# Patient Record
Sex: Female | Born: 1935 | Race: White | Hispanic: No | Smoking: Current every day smoker
Health system: Southern US, Community
[De-identification: ages and names within clinical notes are randomized; demographics above are authoritative.]

## PROBLEM LIST (undated history)

## (undated) DIAGNOSIS — E039 Hypothyroidism, unspecified: Secondary | ICD-10-CM

## (undated) DIAGNOSIS — N189 Chronic kidney disease, unspecified: Secondary | ICD-10-CM

## (undated) DIAGNOSIS — I255 Ischemic cardiomyopathy: Secondary | ICD-10-CM

## (undated) DIAGNOSIS — E440 Moderate protein-calorie malnutrition: Secondary | ICD-10-CM

## (undated) DIAGNOSIS — I499 Cardiac arrhythmia, unspecified: Secondary | ICD-10-CM

## (undated) DIAGNOSIS — R17 Unspecified jaundice: Secondary | ICD-10-CM

## (undated) DIAGNOSIS — I2510 Atherosclerotic heart disease of native coronary artery without angina pectoris: Secondary | ICD-10-CM

## (undated) DIAGNOSIS — K219 Gastro-esophageal reflux disease without esophagitis: Secondary | ICD-10-CM

## (undated) DIAGNOSIS — Z950 Presence of cardiac pacemaker: Secondary | ICD-10-CM

## (undated) DIAGNOSIS — I4821 Permanent atrial fibrillation: Secondary | ICD-10-CM

## (undated) DIAGNOSIS — I77810 Thoracic aortic ectasia: Secondary | ICD-10-CM

## (undated) DIAGNOSIS — F332 Major depressive disorder, recurrent severe without psychotic features: Secondary | ICD-10-CM

## (undated) DIAGNOSIS — R001 Bradycardia, unspecified: Secondary | ICD-10-CM

## (undated) DIAGNOSIS — F419 Anxiety disorder, unspecified: Secondary | ICD-10-CM

## (undated) HISTORY — PX: CORONARY ARTERY BYPASS GRAFT: SHX141

## (undated) HISTORY — DX: Bradycardia, unspecified: R00.1

## (undated) HISTORY — DX: Ischemic cardiomyopathy: I25.5

## (undated) HISTORY — DX: Atherosclerotic heart disease of native coronary artery without angina pectoris: I25.10

## (undated) HISTORY — DX: Chronic kidney disease, unspecified: N18.9

## (undated) HISTORY — DX: Major depressive disorder, recurrent severe without psychotic features: F33.2

## (undated) HISTORY — DX: Hypothyroidism, unspecified: E03.9

## (undated) HISTORY — DX: Gastro-esophageal reflux disease without esophagitis: K21.9

## (undated) HISTORY — DX: Anxiety disorder, unspecified: F41.9

## (undated) HISTORY — PX: OTHER SURGICAL HISTORY: SHX169

## (undated) HISTORY — DX: Thoracic aortic ectasia: I77.810

## (undated) HISTORY — DX: Permanent atrial fibrillation: I48.21

## (undated) HISTORY — DX: Moderate protein-calorie malnutrition: E44.0

---
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
## 1968-06-14 HISTORY — PX: CORONARY ARTERY BYPASS GRAFT: SHX141

## 2006-11-16 HISTORY — PX: PACEMAKER IMPLANT: EP1218

## 2017-06-15 HISTORY — PX: PACEMAKER GENERATOR CHANGE: SHX5998

## 2017-12-20 DIAGNOSIS — D509 Iron deficiency anemia, unspecified: Secondary | ICD-10-CM | POA: Diagnosis not present

## 2017-12-20 DIAGNOSIS — E039 Hypothyroidism, unspecified: Secondary | ICD-10-CM | POA: Diagnosis not present

## 2017-12-20 DIAGNOSIS — D529 Folate deficiency anemia, unspecified: Secondary | ICD-10-CM | POA: Diagnosis not present

## 2017-12-20 DIAGNOSIS — R739 Hyperglycemia, unspecified: Secondary | ICD-10-CM | POA: Diagnosis not present

## 2017-12-20 DIAGNOSIS — E782 Mixed hyperlipidemia: Secondary | ICD-10-CM | POA: Diagnosis not present

## 2017-12-20 DIAGNOSIS — D519 Vitamin B12 deficiency anemia, unspecified: Secondary | ICD-10-CM | POA: Diagnosis not present

## 2017-12-20 DIAGNOSIS — I2510 Atherosclerotic heart disease of native coronary artery without angina pectoris: Secondary | ICD-10-CM | POA: Diagnosis not present

## 2017-12-20 DIAGNOSIS — I482 Chronic atrial fibrillation: Secondary | ICD-10-CM | POA: Diagnosis not present

## 2017-12-20 DIAGNOSIS — I10 Essential (primary) hypertension: Secondary | ICD-10-CM | POA: Diagnosis not present

## 2017-12-20 DIAGNOSIS — D649 Anemia, unspecified: Secondary | ICD-10-CM | POA: Diagnosis not present

## 2018-01-16 ENCOUNTER — Encounter: Payer: Self-pay | Admitting: *Deleted

## 2018-01-17 ENCOUNTER — Telehealth: Payer: Self-pay | Admitting: Cardiovascular Disease

## 2018-01-17 ENCOUNTER — Encounter: Payer: Self-pay | Admitting: Cardiovascular Disease

## 2018-01-17 ENCOUNTER — Other Ambulatory Visit: Payer: Self-pay

## 2018-01-17 ENCOUNTER — Ambulatory Visit (INDEPENDENT_AMBULATORY_CARE_PROVIDER_SITE_OTHER): Payer: Medicare Other | Admitting: Cardiovascular Disease

## 2018-01-17 VITALS — BP 103/63 | HR 78 | Ht 66.0 in | Wt 124.0 lb

## 2018-01-17 DIAGNOSIS — I25708 Atherosclerosis of coronary artery bypass graft(s), unspecified, with other forms of angina pectoris: Secondary | ICD-10-CM | POA: Diagnosis not present

## 2018-01-17 DIAGNOSIS — I5022 Chronic systolic (congestive) heart failure: Secondary | ICD-10-CM

## 2018-01-17 DIAGNOSIS — I77810 Thoracic aortic ectasia: Secondary | ICD-10-CM | POA: Diagnosis not present

## 2018-01-17 DIAGNOSIS — I429 Cardiomyopathy, unspecified: Secondary | ICD-10-CM

## 2018-01-17 DIAGNOSIS — I482 Chronic atrial fibrillation: Secondary | ICD-10-CM

## 2018-01-17 DIAGNOSIS — Z950 Presence of cardiac pacemaker: Secondary | ICD-10-CM

## 2018-01-17 DIAGNOSIS — I4821 Permanent atrial fibrillation: Secondary | ICD-10-CM

## 2018-01-17 NOTE — Addendum Note (Signed)
Addended by: Julian Hy T on: 01/17/2018 02:22 PM   Modules accepted: Orders

## 2018-01-17 NOTE — Patient Instructions (Addendum)
Medication Instructions:  Continue all current medications.  Labwork: none  Testing/Procedures:  Your physician has requested that you have an echocardiogram. Echocardiography is a painless test that uses sound waves to create images of your heart. It provides your doctor with information about the size and shape of your heart and how well your heart's chambers and valves are working. This procedure takes approximately one hour. There are no restrictions for this procedure.  Office will contact with results via phone or letter.    Follow-Up: Your physician wants you to follow up in: 6 months.  You will receive a reminder letter in the mail one-two months in advance.  If you don't receive a letter, please call our office to schedule the follow up appointment   Any Other Special Instructions Will Be Listed Below (If Applicable).  Enroll in our anticoagulation clinic Edrick Oh, RN) for Warfarin management.  Enroll in our device clinic.  If you need a refill on your cardiac medications before your next appointment, please call your pharmacy.

## 2018-01-17 NOTE — Addendum Note (Signed)
Addended by: Laurine Blazer on: 01/17/2018 02:16 PM   Modules accepted: Orders

## 2018-01-17 NOTE — Progress Notes (Addendum)
CARDIOLOGY CONSULT NOTE  Patient ID: Angelica Hernandez MRN: [id number] DOB/AGE: Dec 09, 1933 84 y.o.  Admit date: (Not on file) Primary Physician: Caryl Bis, MD Referring Physician: Caryl Bis, MD  Reason for Consultation: Coronary artery disease and atrial fibrillation  HPI: Angelica Hernandez is a 84 y.o. female who is being seen today for the evaluation of coronary artery disease and atrial fibrillation at the request of Caryl Bis, MD.   I reviewed extensive documentation provided by the patient's PCP.  She used to live in [place] and received her cardiac care in [place], [place].  I reviewed a progress note from 11/30/2016 by Dr. Luevenia Maxin, and electrophysiologist.  She reportedly has a history of coronary artery bypass graft surgery, sick sinus syndrome, permanent atrial fibrillation, mild dilation of ascending aorta, and pacemaker.  Three-vessel CABG was performed in the 1970s.  She had implantation of a Engineer, production pacemaker in 2008.  She underwent Holter monitoring in April 2018 which showed "atrial fibrillation with atrial pacing with a minimum heart rate of 66 bpm and a maximum heart rate of 179 bpm and an average heart rate of 94 bpm.  There were rare PVCs (total 14 beats) and one run of ventricular tachycardia.  The longest run was 5 beats at a rate of 148 bpm."  Echocardiogram on 09/23/2016 showed moderately reduced left ventricular systolic function, LVEF 17%, mild concentric LVH, mild dilation of ascending aorta, and mild mitral and tricuspid regurgitation.  Device interrogation in June 2018 demonstrated normal device function with 53% ventricular pacing and SSIR mode.  It appears she underwent generator replacement on 06/15/2017.  She walks her puppy every morning and denies exertional chest pain and shortness of breath.  She has seldom has chest pains and takes nitroglycerin when she does with immediate relief.  She denies  orthopnea and paroxysmal nocturnal dyspnea.  She has occasional palpitations.  She seldom has bilateral ankle swelling.  ECG performed in the office today which I ordered and personally interpreted demonstrates rate controlled atrial fibrillation.  Social history: She was born in [place].  Her father used to work in the [employer].  She moved to [place] when her daughter was 11 years old and she is now over 12 years old.  She moved to [place] 4 months ago as her daughter recently bought a house here.  She has 2 children.  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  Family history: 5 brothers died of myocardial infarction last year.  Her sister died from a fall.  Both her mother and father had MIs.     No Known Allergies  Current Outpatient Medications  Medication Sig Dispense Refill  . aspirin 81 MG chewable tablet Chew by mouth daily.    Marland Kitchen atorvastatin (LIPITOR) 10 MG tablet Take 10 mg by mouth daily.    . benazepril (LOTENSIN) 5 MG tablet Take 5 mg by mouth daily.    Marland Kitchen levothyroxine (SYNTHROID, LEVOTHROID) 50 MCG tablet Take 50 mcg by mouth daily before breakfast.    . metoprolol succinate (TOPROL-XL) 50 MG 24 hr tablet Take 50 mg by mouth daily. Take with or immediately following a meal.    . nitroGLYCERIN (NITROSTAT) 0.4 MG SL tablet Place 0.4 mg under the tongue every 5 (five) minutes as needed for chest pain.    Marland Kitchen warfarin (COUMADIN) 5 MG tablet Take 5 mg by mouth daily.  No current facility-administered medications for this visit.     Past Medical History:  Diagnosis Date  . Anxiety   . Arrhythmia   . Atrial fibrillation ([place])   . CAD (coronary artery disease)   . Chest pain   . CKD (chronic kidney disease)   . Gastro-esophageal reflux disease without esophagitis   . Hypothyroidism   . Mild dilation of ascending aorta (HCC)   . Moderate protein-calorie malnutrition ([place])   . Pacemaker   . Severe episode of recurrent major depressive  disorder, without psychotic features ([place])   . Sick sinus syndrome [facility])     Past Surgical History:  Procedure Laterality Date  . CESAREAN SECTION    . CORONARY ARTERY BYPASS GRAFT    . OTHER SURGICAL HISTORY     cesearan section   . pacemaker epicardial      Social History   Socioeconomic History  . Marital status: Widowed    Spouse name: Not on file  . Number of children: Not on file  . Years of education: Not on file  . Highest education level: Not on file  Occupational History  . Not on file  Social Needs  . Financial resource strain: Not on file  . Food insecurity:    Worry: Not on file    Inability: Not on file  . Transportation needs:    Medical: Not on file    Non-medical: Not on file  Tobacco Use  . Smoking status: Current Every Day Smoker    Packs/day: 0.50    Types: Cigarettes  . Smokeless tobacco: Never Used  Substance and Sexual Activity  . Alcohol use: Not on file  . Drug use: Not on file  . Sexual activity: Not on file  Lifestyle  . Physical activity:    Days per week: Not on file    Minutes per session: Not on file  . Stress: Not on file  Relationships  . Social connections:    Talks on phone: Not on file    Gets together: Not on file    Attends religious service: Not on file    Active member of club or organization: Not on file    Attends meetings of clubs or organizations: Not on file    Relationship status: Not on file  . Intimate partner violence:    Fear of current or ex partner: Not on file    Emotionally abused: Not on file    Physically abused: Not on file    Forced sexual activity: Not on file  Other Topics Concern  . Not on file  Social History Narrative  . Not on file      Current Meds  Medication Sig  . aspirin 81 MG chewable tablet Chew by mouth daily.  Marland Kitchen atorvastatin (LIPITOR) 10 MG tablet Take 10 mg by mouth daily.  . benazepril (LOTENSIN) 5 MG tablet Take 5 mg by mouth daily.  Marland Kitchen levothyroxine (SYNTHROID,  LEVOTHROID) 50 MCG tablet Take 50 mcg by mouth daily before breakfast.  . metoprolol succinate (TOPROL-XL) 50 MG 24 hr tablet Take 50 mg by mouth daily. Take with or immediately following a meal.  . nitroGLYCERIN (NITROSTAT) 0.4 MG SL tablet Place 0.4 mg under the tongue every 5 (five) minutes as needed for chest pain.  Marland Kitchen warfarin (COUMADIN) 5 MG tablet Take 5 mg by mouth daily.      Review of systems complete and found to be negative unless listed above in HPI    Physical  exam Blood pressure 103/63, pulse 78, height 5\' 6"  (1.676 m), weight 124 lb (56.2 kg), SpO2 99 %. General: NAD Neck: No JVD, no thyromegaly or thyroid nodule.  Lungs: Clear to auscultation bilaterally with normal respiratory effort. CV: Nondisplaced PMI. Regular rate and irregular rhythm, normal S1/S2, no S3, no murmur.  No peripheral edema.  No carotid bruit.   Abdomen: Soft, nontender, no distention.  Skin: Intact without lesions or rashes.  Neurologic: Alert and oriented x 3.  Psych: Normal affect. Extremities: No clubbing or cyanosis.  HEENT: Normal.   ECG: Most recent ECG reviewed.   Labs: No results found for: K, BUN, CREATININE, ALT, TSH, HGB   Lipids: No results found for: LDLCALC, LDLDIRECT, CHOL, TRIG, HDL      ASSESSMENT AND PLAN:   1.  Coronary artery disease with three-vessel CABG: Symptomatically stable.  Currently on aspirin, metoprolol succinate, benazepril, and Lipitor. I will order a 2-D echocardiogram with Doppler to evaluate cardiac structure, function, and regional wall motion.  I will try to obtain a copy of her most recent stress test from [place].  2.  Permanent atrial fibrillation:  Symptomatically stable on Toprol-XL.  Anticoagulated with warfarin.  I will enroll in our anticoagulation clinic for INR monitoring.  Holter monitor reviewed above.  3.  Mild aortic dilatation: I will order a 2-D echocardiogram with Doppler to evaluate aortic root size.  4.  Pacemaker: I will enroll  in device clinic for routine surveillance monitoring.  She underwent generator change in January 2019.  She has a Chiropractor initially implanted in 2008.  5.  Cardiomyopathy/chronic systolic heart failure: She appears euvolemic and not in need of diuretics.  She is on metoprolol succinate 50 mg daily and benazepril 5 mg daily.  No changes to therapy.  I will obtain an echocardiogram to assess cardiac structure and function.  Disposition: Follow up in 6 months  Signed: Kate Sable, M.D., F.A.C.C.  01/17/2018, 1:41 PM

## 2018-01-17 NOTE — Telephone Encounter (Signed)
Pre-cert Verification for the following procedure   Echo scheduled for 02-22-2018

## 2018-01-19 DIAGNOSIS — Z950 Presence of cardiac pacemaker: Secondary | ICD-10-CM | POA: Insufficient documentation

## 2018-01-19 NOTE — Addendum Note (Signed)
Addended by: Laurine Blazer on: 01/19/2018 05:51 PM   Modules accepted: Orders

## 2018-02-02 DIAGNOSIS — D519 Vitamin B12 deficiency anemia, unspecified: Secondary | ICD-10-CM | POA: Diagnosis not present

## 2018-02-02 DIAGNOSIS — D649 Anemia, unspecified: Secondary | ICD-10-CM | POA: Diagnosis not present

## 2018-02-02 DIAGNOSIS — D529 Folate deficiency anemia, unspecified: Secondary | ICD-10-CM | POA: Diagnosis not present

## 2018-02-14 ENCOUNTER — Ambulatory Visit (INDEPENDENT_AMBULATORY_CARE_PROVIDER_SITE_OTHER): Payer: Medicare Other | Admitting: *Deleted

## 2018-02-14 DIAGNOSIS — I4891 Unspecified atrial fibrillation: Secondary | ICD-10-CM | POA: Diagnosis not present

## 2018-02-14 DIAGNOSIS — Z5181 Encounter for therapeutic drug level monitoring: Secondary | ICD-10-CM

## 2018-02-14 LAB — POCT INR: INR: 4 — AB (ref 2.0–3.0)

## 2018-02-14 NOTE — Patient Instructions (Signed)
Takes coumadin in the morning.  Took it today. Hold coumadin tomorrow then resume 5mg  (1 tablet daily) Increase intake of Vit K food Recheck in 2 weeks

## 2018-02-20 ENCOUNTER — Encounter: Payer: Medicare Other | Admitting: Internal Medicine

## 2018-02-21 ENCOUNTER — Encounter: Payer: Self-pay | Admitting: *Deleted

## 2018-02-22 ENCOUNTER — Ambulatory Visit (INDEPENDENT_AMBULATORY_CARE_PROVIDER_SITE_OTHER): Payer: Medicare Other

## 2018-02-22 ENCOUNTER — Other Ambulatory Visit: Payer: Self-pay

## 2018-02-22 DIAGNOSIS — I429 Cardiomyopathy, unspecified: Secondary | ICD-10-CM

## 2018-02-22 DIAGNOSIS — I361 Nonrheumatic tricuspid (valve) insufficiency: Secondary | ICD-10-CM

## 2018-02-24 ENCOUNTER — Telehealth: Payer: Self-pay | Admitting: *Deleted

## 2018-02-24 NOTE — Telephone Encounter (Signed)
Notes recorded by Laurine Blazer, LPN on 6/38/4536 at 4:68 AM EDT Daughter Jamey Ripa) notified. Copy to pmd. ------  Notes recorded by Herminio Commons, MD on 02/23/2018 at 8:51 AM EDT Pumping function was mildly reduced but has improved since prior echocardiogram in 2018. There was some mild mitral and moderate tricuspid valve leakage. No changes to medical management.

## 2018-02-28 ENCOUNTER — Ambulatory Visit (INDEPENDENT_AMBULATORY_CARE_PROVIDER_SITE_OTHER): Payer: Medicare Other | Admitting: *Deleted

## 2018-02-28 DIAGNOSIS — I4891 Unspecified atrial fibrillation: Secondary | ICD-10-CM

## 2018-02-28 DIAGNOSIS — Z5181 Encounter for therapeutic drug level monitoring: Secondary | ICD-10-CM | POA: Diagnosis not present

## 2018-02-28 LAB — POCT INR: INR: 1.5 — AB (ref 2.0–3.0)

## 2018-02-28 NOTE — Patient Instructions (Signed)
Takes coumadin in the morning.  Took it today. Take coumadin extra 1/2 tablet tonight, 1 1/2 tablets tomorrow night then resume 5mg  (1 tablet daily) Cut back on amount of Vit K foods Recheck in 2 weeks

## 2018-03-02 ENCOUNTER — Ambulatory Visit: Payer: Medicare Other | Admitting: Internal Medicine

## 2018-03-02 ENCOUNTER — Encounter: Payer: Self-pay | Admitting: Internal Medicine

## 2018-03-02 VITALS — BP 112/60 | HR 81 | Ht 66.0 in | Wt 122.4 lb

## 2018-03-02 DIAGNOSIS — Z950 Presence of cardiac pacemaker: Secondary | ICD-10-CM

## 2018-03-02 DIAGNOSIS — Z951 Presence of aortocoronary bypass graft: Secondary | ICD-10-CM

## 2018-03-02 DIAGNOSIS — I482 Chronic atrial fibrillation: Secondary | ICD-10-CM

## 2018-03-02 DIAGNOSIS — I4821 Permanent atrial fibrillation: Secondary | ICD-10-CM

## 2018-03-02 DIAGNOSIS — R001 Bradycardia, unspecified: Secondary | ICD-10-CM | POA: Diagnosis not present

## 2018-03-02 NOTE — Patient Instructions (Addendum)
Medication Instructions:  Your physician has recommended you make the following change in your medication:   1.  Stop taking aspirin  Labwork: None ordered.  Testing/Procedures: None ordered.  Follow-Up: Your physician wants you to follow-up in: one year with Dr. Rayann Heman in [place].   You will receive a reminder letter in the mail two months in advance. If you don't receive a letter, please call our office to schedule the follow-up appointment.  Remote monitoring is used to monitor your Pacemaker from home. This monitoring reduces the number of office visits required to check your device to one time per year. It allows [place] to keep an eye on the functioning of your device to ensure it is working properly. You are scheduled for a device check from home on 06/01/2018. You may send your transmission at any time that day. If you have a wireless device, the transmission will be sent automatically. After your physician reviews your transmission, you will receive a postcard with your next transmission date.  Any Other Special Instructions Will Be Listed Below (If Applicable).  If you need a refill on your cardiac medications before your next appointment, please call your pharmacy.

## 2018-03-02 NOTE — Progress Notes (Signed)
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 female with a h/o bradycardia sp PPM ([employer]Sc[employer]) in 2008 with generator change by Dr Ned Clines in [place] who presents today to establish care in the Electrophysiology device clinic.  She has permanent afib.  Given prior bradycardia, she had PPM implanted in 2008  Currently, she V paces only 3 %.  V rates appear controlled. The patient reports doing very well since having a pacemaker implanted and remains very active despite her age.  She recently moved to [place]  this summer to be near her daughter.  Today, she  denies symptoms of palpitations, chest pain, shortness of breath, orthopnea, PND, lower extremity edema, dizziness, presyncope, syncope, or neurologic sequela.  The patientis tolerating medications without difficulties and is otherwise without complaint today.   Past Medical History:  Diagnosis Date  . Anxiety   . CAD (coronary artery disease)   . CKD (chronic kidney disease)   . Gastro-esophageal reflux disease without esophagitis   . Hypothyroidism   . Ischemic cardiomyopathy   . Mild dilation of ascending aorta (H[facility])   . Moderate protein-calorie malnutrition ([place])   . Permanent atrial fibrillation ([place])   . Severe episode of recurrent major depressive disorder, without psychotic features ([place])   . Symptomatic bradycardia    Past Surgical History:  Procedure Laterality Date  . CESAREAN SECTION    . CORONARY ARTERY BYPASS GRAFT    . OTHER SURGICAL HISTORY     cesearan section   . PACEMAKER GENERATOR CHANGE  06/15/2017   Boston Scientific Accolade SR L 300 ([place] [id number]) implanted by Dr Ernestina Patches at [facility] in [place] for symptomatic bradycardia  . PACEMAKER IMPLANT  11/16/2006   PPM implanted for bradycardia    Social History   Socioeconomic History  . Marital status: Widowed    Spouse name: Not on file  . Number of children: Not on file  . Years of  education: Not on file  . Highest education level: Not on file  Occupational History  . Not on file  Social Needs  . Financial resource strain: Not on file  . Food insecurity:    Worry: Not on file    Inability: Not on file  . Transportation needs:    Medical: Not on file    Non-medical: Not on file  Tobacco Use  . Smoking status: Current Every Day Smoker    Packs/day: 0.50    Types: Cigarettes  . Smokeless tobacco: Never Used  Substance and Sexual Activity  . Alcohol use: Not on file  . Drug use: Not on file  . Sexual activity: Not on file  Lifestyle  . Physical activity:    Days per week: Not on file    Minutes per session: Not on file  . Stress: Not on file  Relationships  . Social connections:    Talks on phone: Not on file    Gets together: Not on file    Attends religious service: Not on file    Active member of club or organization: Not on file    Attends meetings of clubs or organizations: Not on file    Relationship status: Not on file  . Intimate partner violence:    Fear of current or ex partner: Not on file    Emotionally abused: Not on file    Physically abused: Not on file    Forced sexual activity: Not on file  Other Topics Concern  . Not on file  Social History Narrative   Lives with daughter in [place]    History reviewed. No pertinent family history.  No Known Allergies  Current Outpatient Medications  Medication Sig Dispense Refill  . atorvastatin (LIPITOR) 10 MG tablet Take 10 mg by mouth daily.    . benazepril (LOTENSIN) 5 MG tablet Take 5 mg by mouth daily.    . Cholecalciferol (VITAMIN D3) 1000 units CAPS Take 1,000 Units by mouth daily.    Marland Kitchen FERREX 150 150 MG capsule Take 150 mg by mouth 2 (two) times daily.  1  . levothyroxine (SYNTHROID, LEVOTHROID) 50 MCG tablet Take 50 mcg by mouth daily before breakfast.    . Magnesium 400 MG CAPS Take 400 mg by mouth daily.    . metoprolol succinate (TOPROL-XL) 50 MG 24 hr tablet Take 50 mg by mouth  daily. Take with or immediately following a meal.    . nitroGLYCERIN (NITROSTAT) 0.4 MG SL tablet Place 0.4 mg under the tongue every 5 (five) minutes as needed for chest pain.    . vitamin C (ASCORBIC ACID) 500 MG tablet Take 500 mg by mouth daily.    Marland Kitchen warfarin (COUMADIN) 5 MG tablet Take 5 mg by mouth daily.     No current facility-administered medications for this visit.     ROS- all systems are reviewed and negative except as per HPI  Physical Exam: Vitals:   03/02/18 1435  BP: 112/60  Pulse: 81  SpO2: 96%  Weight: 122 lb 6.4 oz (55.5 kg)  Height: 5\' 6"  (1.676 m)    GEN- The patient is elderly appearing, alert and oriented x 3 today.   Head- normocephalic, atraumatic Eyes-  Sclera clear, conjunctiva pink Ears- hearing intact Oropharynx- clear Neck- supple, no JVP Lungs- Clear to ausculation bilaterally, normal work of breathing Chest- pacemaker pocket is well healed Heart- iRRR, no murmurs, rubs or gallops, PMI not laterally displaced GI- soft, NT, ND, + BS Extremities- no clubbing, cyanosis, or edema MS- diffuse atrophy Skin- no rash or lesion Psych- euthymic mood, full affect Neuro- strength and sensation are intact  Pacemaker interrogation- reviewed in detail today,  See PACEART report  Assessment and Plan:  1. Symptomatic bradycardia Normal pacemaker function See Pace Art report No changes today  2. Permanent afib Mostly rate controlled On coumadin for chads2vasc score of at least 5 Stop ASA  3. CAD S/p CABG No ischemic symptoms Stop ASA as per guideliens  [place] Will follow-up with me in a year in [place] for device management  Thompson Grayer MD, [facility] 03/02/2018 3:27 PM

## 2018-03-14 ENCOUNTER — Ambulatory Visit (INDEPENDENT_AMBULATORY_CARE_PROVIDER_SITE_OTHER): Payer: Medicare Other | Admitting: *Deleted

## 2018-03-14 DIAGNOSIS — I4891 Unspecified atrial fibrillation: Secondary | ICD-10-CM | POA: Diagnosis not present

## 2018-03-14 DIAGNOSIS — Z5181 Encounter for therapeutic drug level monitoring: Secondary | ICD-10-CM | POA: Diagnosis not present

## 2018-03-14 LAB — POCT INR: INR: 3.8 — AB (ref 2.0–3.0)

## 2018-03-14 NOTE — Patient Instructions (Signed)
Takes coumadin in the morning.  Has not taken yet today Hold coumadin tonight then decrease dose to 5mg  (1 tablet) daily except 2.5mg  ( 1/2 tablet) on Saturdays Be consistent with Vit K foods Recheck in 2 weeks

## 2018-03-28 ENCOUNTER — Ambulatory Visit (INDEPENDENT_AMBULATORY_CARE_PROVIDER_SITE_OTHER): Payer: Medicare Other | Admitting: *Deleted

## 2018-03-28 DIAGNOSIS — I4891 Unspecified atrial fibrillation: Secondary | ICD-10-CM | POA: Diagnosis not present

## 2018-03-28 DIAGNOSIS — D519 Vitamin B12 deficiency anemia, unspecified: Secondary | ICD-10-CM | POA: Diagnosis not present

## 2018-03-28 DIAGNOSIS — D529 Folate deficiency anemia, unspecified: Secondary | ICD-10-CM | POA: Diagnosis not present

## 2018-03-28 DIAGNOSIS — Z5181 Encounter for therapeutic drug level monitoring: Secondary | ICD-10-CM

## 2018-03-28 DIAGNOSIS — D649 Anemia, unspecified: Secondary | ICD-10-CM | POA: Diagnosis not present

## 2018-03-28 LAB — POCT INR: INR: 1.5 — AB (ref 2.0–3.0)

## 2018-03-28 NOTE — Patient Instructions (Signed)
Takes coumadin in the morning. Take coumadin extras 1/2 tablet today. Take 1 1/2 tablets tomorrow then increase dose back to 1 tablet daily Be consistent with Vit K foods Recheck in 2 weeks

## 2018-04-13 ENCOUNTER — Ambulatory Visit (INDEPENDENT_AMBULATORY_CARE_PROVIDER_SITE_OTHER): Payer: Medicare Other | Admitting: *Deleted

## 2018-04-13 DIAGNOSIS — I4891 Unspecified atrial fibrillation: Secondary | ICD-10-CM

## 2018-04-13 DIAGNOSIS — Z5181 Encounter for therapeutic drug level monitoring: Secondary | ICD-10-CM

## 2018-04-13 LAB — POCT INR: INR: 1.5 — AB (ref 2.0–3.0)

## 2018-04-13 NOTE — Patient Instructions (Signed)
Takes coumadin in the morning. Take coumadin extra 1/2 tablet today then increase dose to 1 tablet daily except 1 1/2 tablets on Tuesdays and Fridays Be consistent with Vit K foods Recheck in 2 weeks

## 2018-04-27 ENCOUNTER — Ambulatory Visit (INDEPENDENT_AMBULATORY_CARE_PROVIDER_SITE_OTHER): Payer: Medicare Other | Admitting: *Deleted

## 2018-04-27 DIAGNOSIS — I4891 Unspecified atrial fibrillation: Secondary | ICD-10-CM

## 2018-04-27 DIAGNOSIS — Z5181 Encounter for therapeutic drug level monitoring: Secondary | ICD-10-CM | POA: Diagnosis not present

## 2018-04-27 LAB — POCT INR: INR: 3.2 — AB (ref 2.0–3.0)

## 2018-04-27 NOTE — Patient Instructions (Signed)
Takes coumadin in the morning. Decrease coumadin to 1 tablet daily except 1 1/2 tablets on Tuesdays Be consistent with Vit K foods Recheck in 3 weeks

## 2018-05-19 ENCOUNTER — Ambulatory Visit (INDEPENDENT_AMBULATORY_CARE_PROVIDER_SITE_OTHER): Payer: Medicare Other | Admitting: *Deleted

## 2018-05-19 DIAGNOSIS — Z5181 Encounter for therapeutic drug level monitoring: Secondary | ICD-10-CM

## 2018-05-19 DIAGNOSIS — I4891 Unspecified atrial fibrillation: Secondary | ICD-10-CM

## 2018-05-19 LAB — POCT INR: INR: 3.7 — AB (ref 2.0–3.0)

## 2018-05-19 NOTE — Patient Instructions (Signed)
Takes coumadin in the morning. Hold coumadin tomorrow then decrease dose to 1 tablet daily Be consistent with Vit K foods Recheck in 4 weeks

## 2018-05-25 DIAGNOSIS — E782 Mixed hyperlipidemia: Secondary | ICD-10-CM | POA: Diagnosis not present

## 2018-05-25 DIAGNOSIS — E039 Hypothyroidism, unspecified: Secondary | ICD-10-CM | POA: Diagnosis not present

## 2018-05-25 DIAGNOSIS — D529 Folate deficiency anemia, unspecified: Secondary | ICD-10-CM | POA: Diagnosis not present

## 2018-05-25 DIAGNOSIS — D519 Vitamin B12 deficiency anemia, unspecified: Secondary | ICD-10-CM | POA: Diagnosis not present

## 2018-05-29 DIAGNOSIS — Z0001 Encounter for general adult medical examination with abnormal findings: Secondary | ICD-10-CM | POA: Diagnosis not present

## 2018-05-29 DIAGNOSIS — I10 Essential (primary) hypertension: Secondary | ICD-10-CM | POA: Diagnosis not present

## 2018-05-29 DIAGNOSIS — Z1212 Encounter for screening for malignant neoplasm of rectum: Secondary | ICD-10-CM | POA: Diagnosis not present

## 2018-06-27 ENCOUNTER — Ambulatory Visit (INDEPENDENT_AMBULATORY_CARE_PROVIDER_SITE_OTHER): Payer: Medicare Other | Admitting: Pharmacist

## 2018-06-27 DIAGNOSIS — I4891 Unspecified atrial fibrillation: Secondary | ICD-10-CM | POA: Diagnosis not present

## 2018-06-27 DIAGNOSIS — Z5181 Encounter for therapeutic drug level monitoring: Secondary | ICD-10-CM

## 2018-06-27 LAB — POCT INR: INR: 1.3 — AB (ref 2.0–3.0)

## 2018-06-27 NOTE — Patient Instructions (Signed)
Description   Takes coumadin in the morning. Take 1 tablet today when you get home (has already taken one tablet) then start taking 1 tablet daily Be consistent with Vit K foods Recheck in 2 weeks

## 2018-06-28 ENCOUNTER — Encounter: Payer: Self-pay | Admitting: Internal Medicine

## 2018-07-09 ENCOUNTER — Other Ambulatory Visit: Payer: Self-pay

## 2018-07-09 ENCOUNTER — Encounter (HOSPITAL_COMMUNITY): Payer: Self-pay | Admitting: Emergency Medicine

## 2018-07-09 ENCOUNTER — Telehealth: Payer: Self-pay | Admitting: Student

## 2018-07-09 ENCOUNTER — Emergency Department (HOSPITAL_COMMUNITY)
Admission: EM | Admit: 2018-07-09 | Discharge: 2018-07-09 | Disposition: A | Discharge status: Home / Self Care | Payer: Medicare Other | Attending: Emergency Medicine | Admitting: Emergency Medicine

## 2018-07-09 ENCOUNTER — Emergency Department (HOSPITAL_COMMUNITY): Payer: Medicare Other

## 2018-07-09 DIAGNOSIS — Z7901 Long term (current) use of anticoagulants: Secondary | ICD-10-CM | POA: Insufficient documentation

## 2018-07-09 DIAGNOSIS — M11262 Other chondrocalcinosis, left knee: Secondary | ICD-10-CM | POA: Insufficient documentation

## 2018-07-09 DIAGNOSIS — I2510 Atherosclerotic heart disease of native coronary artery without angina pectoris: Secondary | ICD-10-CM | POA: Insufficient documentation

## 2018-07-09 DIAGNOSIS — F17210 Nicotine dependence, cigarettes, uncomplicated: Secondary | ICD-10-CM | POA: Insufficient documentation

## 2018-07-09 DIAGNOSIS — M25462 Effusion, left knee: Secondary | ICD-10-CM | POA: Diagnosis not present

## 2018-07-09 DIAGNOSIS — M109 Gout, unspecified: Secondary | ICD-10-CM | POA: Diagnosis not present

## 2018-07-09 DIAGNOSIS — M25562 Pain in left knee: Secondary | ICD-10-CM | POA: Diagnosis present

## 2018-07-09 DIAGNOSIS — E039 Hypothyroidism, unspecified: Secondary | ICD-10-CM | POA: Diagnosis not present

## 2018-07-09 DIAGNOSIS — N189 Chronic kidney disease, unspecified: Secondary | ICD-10-CM | POA: Diagnosis not present

## 2018-07-09 LAB — CBC WITH DIFFERENTIAL/PLATELET
Abs Immature Granulocytes: 0.02 10*3/uL (ref 0.00–0.07)
Basophils Absolute: 0.1 10*3/uL (ref 0.0–0.1)
Basophils Relative: 1 %
Eosinophils Absolute: 0 10*3/uL (ref 0.0–0.5)
Eosinophils Relative: 0 %
HCT: 40.9 % (ref 36.0–46.0)
Hemoglobin: 12.9 g/dL (ref 12.0–15.0)
Immature Granulocytes: 0 %
Lymphocytes Relative: 34 %
Lymphs Abs: 3.3 10*3/uL (ref 0.7–4.0)
MCH: 29 pg (ref 26.0–34.0)
MCHC: 31.5 g/dL (ref 30.0–36.0)
MCV: 91.9 fL (ref 80.0–100.0)
Monocytes Absolute: 1 10*3/uL (ref 0.1–1.0)
Monocytes Relative: 10 %
Neutro Abs: 5.4 10*3/uL (ref 1.7–7.7)
Neutrophils Relative %: 55 %
PLATELETS: 229 10*3/uL (ref 150–400)
RBC: 4.45 MIL/uL (ref 3.87–5.11)
RDW: 14.5 % (ref 11.5–15.5)
WBC: 9.8 10*3/uL (ref 4.0–10.5)
nRBC: 0 % (ref 0.0–0.2)

## 2018-07-09 LAB — BASIC METABOLIC PANEL
Anion gap: 6 (ref 5–15)
BUN: 8 mg/dL (ref 8–23)
CALCIUM: 8.9 mg/dL (ref 8.9–10.3)
CO2: 27 mmol/L (ref 22–32)
Chloride: 104 mmol/L (ref 98–111)
Creatinine, Ser: 0.61 mg/dL (ref 0.44–1.00)
GFR calc Af Amer: >60 mL/min (ref >60)
GFR calc non Af Amer: >60 mL/min (ref >60)
Glucose, Bld: 104 mg/dL — ABNORMAL HIGH (ref 70–99)
Potassium: 4.1 mmol/L (ref 3.5–5.1)
Sodium: 137 mmol/L (ref 135–145)

## 2018-07-09 MED ORDER — HYDROCODONE-ACETAMINOPHEN 5-325 MG PO TABS: 1.0000 | ORAL_TABLET | Freq: Four times a day (QID) | ORAL | 0 refills | Status: DC | PRN

## 2018-07-09 MED ORDER — HYDROCODONE-ACETAMINOPHEN 5-325 MG PO TABS
1.0000 | ORAL_TABLET | Freq: Once | ORAL | Status: AC
  Administered 2018-07-09: 1 via ORAL
  Filled 2018-07-09: qty 1

## 2018-07-09 MED ORDER — PREDNISONE 50 MG PO TABS
60.0000 mg | ORAL_TABLET | Freq: Once | ORAL | Status: AC
  Administered 2018-07-09: 60 mg via ORAL
  Filled 2018-07-09: qty 1

## 2018-07-09 MED ORDER — PREDNISONE 10 MG PO TABS: 40.0000 mg | ORAL_TABLET | Freq: Every day | ORAL | 0 refills | Status: DC

## 2018-07-09 NOTE — Telephone Encounter (Signed)
     Patient's daughter called to report she awoke this morning with significant pain and swelling along the posterior part of her left knee. Patient denies any injuries to the site. Her daughter feels a protrusion and says it is warm to touch. Patient cannot walk well per her report. Given her acute new-onset symptoms and recent sub-therapeutic INR on Coumadin, I recommended she seek evaluation at an Urgent Care or Emergency Dept that has ultrasound capabilities to rule-out a DVT.   She voiced understanding of this and was appreciative of the call.   Signed, Erma Heritage, PA-C 07/09/2018, 11:09 AM Pager: [phone]

## 2018-07-09 NOTE — ED Triage Notes (Signed)
Patient c/o left leg pain/behind knee that started yesterday without injury. Patient reports swelling in knee. Patient takes coumadin.

## 2018-07-09 NOTE — ED Provider Notes (Signed)
[facility] EMERGENCY DEPARTMENT Provider Note   CSN: [id number] Arrival date & time: 07/09/18  1150     History   Chief Complaint Chief Complaint  Patient presents with  . Claudication    HPI Angelica Hernandez is a 85 y.o. female.  Patient with onset of left knee pain last evening.  No history of any injury.  States most of the pain is behind the knee.  But has swelling to the front of the knee.  No redness.  No systemic symptoms.  Did not feel ill otherwise.  Never had anything like this before.  Knee still moves fine.  Just painful.  Patient is on Coumadin for atrial fibrillation.  No evidence of any bleeding problems.     Past Medical History:  Diagnosis Date  . Anxiety   . CAD (coronary artery disease)   . CKD (chronic kidney disease)   . Gastro-esophageal reflux disease without esophagitis   . Hypothyroidism   . Ischemic cardiomyopathy   . Mild dilation of ascending aorta (HCC)   . Moderate protein-calorie malnutrition ([place])   . Permanent atrial fibrillation   . Severe episode of recurrent major depressive disorder, without psychotic features ([place])   . Symptomatic bradycardia     Patient Active Problem List   Diagnosis Date Noted  . Atrial fibrillation ([place]) 02/14/2018  . Encounter for therapeutic drug monitoring 02/14/2018  . Cardiac pacemaker in situ 01/19/2018    Past Surgical History:  Procedure Laterality Date  . CESAREAN SECTION    . CORONARY ARTERY BYPASS GRAFT    . OTHER SURGICAL HISTORY     cesearan section   . PACEMAKER GENERATOR CHANGE  06/15/2017   [employer] Scientific Accolade SR L 300 ([place] [id number]) implanted by Dr Ernestina Patches at [facility] in [place] for symptomatic bradycardia  . PACEMAKER IMPLANT  11/16/2006   PPM implanted for bradycardia     OB History   No obstetric history on file.      Home Medications    Prior to Admission medications   Medication Sig Start Date End Date Taking? Authorizing Provider    atorvastatin (LIPITOR) 10 MG tablet Take 10 mg by mouth daily.    [provider]  benazepril (LOTENSIN) 5 MG tablet Take 5 mg by mouth daily.    [provider]  Cholecalciferol (VITAMIN D3) 1000 units CAPS Take 1,000 Units by mouth daily.    [provider]  FERREX 150 150 MG capsule Take 150 mg by mouth 2 (two) times daily. 02/07/18   [provider]  HYDROcodone-acetaminophen (NORCO/VICODIN) 5-325 MG tablet Take 1 tablet by mouth every 6 (six) hours as needed. 07/09/18   Fredia Sorrow, MD  levothyroxine (SYNTHROID, LEVOTHROID) 50 MCG tablet Take 50 mcg by mouth daily before breakfast.    [provider]  Magnesium 400 MG CAPS Take 400 mg by mouth daily.    [provider]  metoprolol succinate (TOPROL-XL) 50 MG 24 hr tablet Take 50 mg by mouth daily. Take with or immediately following a meal.    [provider]  nitroGLYCERIN (NITROSTAT) 0.4 MG SL tablet Place 0.4 mg under the tongue every 5 (five) minutes as needed for chest pain.    [provider]  predniSONE (DELTASONE) 10 MG tablet Take 4 tablets (40 mg total) by mouth daily. 07/09/18   Fredia Sorrow, MD  vitamin C (ASCORBIC ACID) 500 MG tablet Take 500 mg by mouth daily.    [provider]  warfarin (  COUMADIN) 5 MG tablet Take 5 mg by mouth daily.    [provider]    Family History No family history on file.  Social History Social History   Tobacco Use  . Smoking status: Current Every Day Smoker    Packs/day: 0.50    Types: Cigarettes  . Smokeless tobacco: Never Used  Substance Use Topics  . Alcohol use: Never    Frequency: Never  . Drug use: Never     Allergies   Patient has no known allergies.   Review of Systems Review of Systems  Constitutional: Negative for chills and fever.  HENT: Negative for rhinorrhea and sore throat.   Eyes: Negative for visual disturbance.  Respiratory: Negative for cough and shortness of  breath.   Cardiovascular: Negative for chest pain and leg swelling.  Gastrointestinal: Negative for abdominal pain, diarrhea, nausea and vomiting.  Genitourinary: Negative for dysuria.  Musculoskeletal: Positive for joint swelling. Negative for back pain and neck pain.  Skin: Negative for rash and wound.  Neurological: Negative for dizziness, light-headedness and headaches.  Hematological: Does not bruise/bleed easily.  Psychiatric/Behavioral: Negative for confusion.     Physical Exam Updated Vital Signs BP 106/86 (BP Location: Right Arm)   Pulse (!) 103   Temp (!) 100.4 F (38 C) (Oral)   Resp 18   Ht 1.676 m (5\' 6" )   Wt 55.5 kg   SpO2 99%   BMI 19.75 kg/m   Physical Exam Vitals signs and nursing note reviewed.  Constitutional:      General: She is not in acute distress.    Appearance: She is well-developed.  HENT:     Head: Normocephalic and atraumatic.     Mouth/Throat:     Mouth: Mucous membranes are moist.  Eyes:     Conjunctiva/sclera: Conjunctivae normal.  Neck:     Musculoskeletal: Neck supple.  Cardiovascular:     Rate and Rhythm: Normal rate and regular rhythm.     Heart sounds: No murmur.  Pulmonary:     Effort: Pulmonary effort is normal. No respiratory distress.     Breath sounds: Normal breath sounds.  Abdominal:     Palpations: Abdomen is soft.     Tenderness: There is no abdominal tenderness.  Musculoskeletal:        General: Swelling present. No signs of injury.     Right lower leg: No edema.     Comments: Left knee with some swelling no redness.  Slight increase in warmth.  No mass or tenderness behind the left knee.  No distal swelling to the knee.  Dorsalis pedis pulses 1+.  Good cap refill to the feet.  Skin:    General: Skin is warm and dry.  Neurological:     Mental Status: She is alert.      ED Treatments / Results  Labs (all labs ordered are listed, but only abnormal results are displayed) Labs Reviewed  BASIC METABOLIC PANEL -  Abnormal; Notable for the following components:      Result Value   Glucose, Bld 104 (*)    All other components within normal limits  CBC WITH DIFFERENTIAL/PLATELET    EKG None  Radiology Dg Knee Complete 4 Views Left  Result Date: 07/09/2018 CLINICAL DATA:  Left knee pain EXAM: LEFT KNEE - COMPLETE 4+ VIEW COMPARISON:  None. FINDINGS: Meniscal chondrocalcinosis. Mild spurring of the tibial spine. Popliteal atherosclerotic calcification. Possible small knee effusion, difficult to be certain due to the obliquity of the lateral  projection. Mild marginal spurring of the patella. IMPRESSION: 1. Potential small knee effusion. 2. Chondrocalcinosis suggesting CPPD arthropathy. Mild spurring of the tibial spine and patella. 3. Atherosclerosis. 4. If pain persists despite conservative therapy, MRI may be warranted for further characterization. Electronically Signed   By: Van Clines M.D.   On: 07/09/2018 16:19    Procedures Procedures (including critical care time)  Medications Ordered in ED Medications - No data to display   Initial Impression / Assessment and Plan / ED Course  I have reviewed the triage vital signs and the nursing notes.  Pertinent labs & imaging results that were available during my care of the patient were reviewed by me and considered in my medical decision making (see chart for details).     X-rays consistent with pseudogout.  Fits the clinical picture as well.  Will treat with a course of steroids and a course of hydrocodone and follow-up with her doctor.  First dose of steroids given here and first dose of hydrocodone given here.  Final Clinical Impressions(s) / ED Diagnoses   Final diagnoses:  Pseudogout of knee, left    ED Discharge Orders         Ordered    predniSONE (DELTASONE) 10 MG tablet  Daily     07/09/18 1627    HYDROcodone-acetaminophen (NORCO/VICODIN) 5-325 MG tablet  Every 6 hours PRN,   Status:  Discontinued     07/09/18 1627     HYDROcodone-acetaminophen (NORCO/VICODIN) 5-325 MG tablet  Every 6 hours PRN     07/09/18 1630           Fredia Sorrow, MD 07/09/18 1633

## 2018-07-09 NOTE — Discharge Instructions (Signed)
X-ray consistent with pseudogout which is a type of inflammatory arthritis.  Take the prednisone as directed for the next 5 days.  Take the hydrocodone as needed for pain relief.  Make an appointment to follow-up with your doctor.

## 2018-07-13 ENCOUNTER — Ambulatory Visit (INDEPENDENT_AMBULATORY_CARE_PROVIDER_SITE_OTHER): Payer: Medicare Other | Admitting: Pharmacist

## 2018-07-13 DIAGNOSIS — Z5181 Encounter for therapeutic drug level monitoring: Secondary | ICD-10-CM

## 2018-07-13 DIAGNOSIS — I4891 Unspecified atrial fibrillation: Secondary | ICD-10-CM | POA: Diagnosis not present

## 2018-07-13 LAB — POCT INR: INR: 5.2 — AB (ref 2.0–3.0)

## 2018-07-13 NOTE — Patient Instructions (Signed)
Description   Takes coumadin in the morning. No warfarin tomorrow or Saturday (since already took today) and only 1/2 tablet on Sunday, then resume 1 tablet daily. Be consistent with Vit K foods Recheck in 1 week

## 2018-07-20 ENCOUNTER — Ambulatory Visit (INDEPENDENT_AMBULATORY_CARE_PROVIDER_SITE_OTHER): Payer: Medicare Other | Admitting: Pharmacist

## 2018-07-20 DIAGNOSIS — I4891 Unspecified atrial fibrillation: Secondary | ICD-10-CM | POA: Diagnosis not present

## 2018-07-20 DIAGNOSIS — Z5181 Encounter for therapeutic drug level monitoring: Secondary | ICD-10-CM

## 2018-07-20 LAB — POCT INR: INR: 2.4 (ref 2.0–3.0)

## 2018-07-20 NOTE — Patient Instructions (Signed)
Description   Takes coumadin in the morning. Continue 1 tablet daily. Be consistent with Vit K foods Recheck in 2 weeks

## 2018-07-25 ENCOUNTER — Ambulatory Visit: Payer: Medicare Other | Admitting: Cardiovascular Disease

## 2018-08-03 ENCOUNTER — Ambulatory Visit: Payer: Medicare Other | Admitting: *Deleted

## 2018-08-03 DIAGNOSIS — I4891 Unspecified atrial fibrillation: Secondary | ICD-10-CM | POA: Diagnosis not present

## 2018-08-03 DIAGNOSIS — Z5181 Encounter for therapeutic drug level monitoring: Secondary | ICD-10-CM

## 2018-08-03 LAB — POCT INR: INR: 1.7 — AB (ref 2.0–3.0)

## 2018-08-03 NOTE — Patient Instructions (Signed)
Takes coumadin in the morning. Take coumadin 1 1/2 tablets tonight and tomorrow night then resume 1 tablet daily. Be consistent with Vit K foods Recheck in 3 weeks

## 2018-08-23 DIAGNOSIS — M25461 Effusion, right knee: Secondary | ICD-10-CM | POA: Diagnosis not present

## 2018-08-24 ENCOUNTER — Ambulatory Visit: Payer: Medicare Other | Admitting: *Deleted

## 2018-08-24 ENCOUNTER — Other Ambulatory Visit: Payer: Self-pay

## 2018-08-24 DIAGNOSIS — Z5181 Encounter for therapeutic drug level monitoring: Secondary | ICD-10-CM

## 2018-08-24 DIAGNOSIS — I4891 Unspecified atrial fibrillation: Secondary | ICD-10-CM

## 2018-08-24 LAB — POCT INR: INR: 2.5 (ref 2.0–3.0)

## 2018-08-24 NOTE — Patient Instructions (Signed)
Takes coumadin in the morning. Continue coumadin 1 tablet daily. Be consistent with Vit K foods Recheck in 4 weeks

## 2018-09-18 ENCOUNTER — Telehealth: Payer: Self-pay | Admitting: *Deleted

## 2018-09-18 ENCOUNTER — Encounter: Payer: Self-pay | Admitting: *Deleted

## 2018-09-18 ENCOUNTER — Other Ambulatory Visit: Payer: Self-pay | Admitting: *Deleted

## 2018-09-18 MED ORDER — NITROGLYCERIN 0.4 MG SL SUBL: 0.4000 mg | SUBLINGUAL_TABLET | SUBLINGUAL | 3 refills | Status: AC | PRN

## 2018-09-18 NOTE — Telephone Encounter (Signed)
Patient contacted for review of chart for virtual visit (telephone) on 09/20/2018 with Dr. Bronson Ing at 9:20.         Medications reviewed.  No recent hospital or lab visits.  Patient states that she does not have scales for weight or a BP monitor for vitals.    The patient verbally consented for a telehealth phone visit with [facility] and understands that his/her insurance company will be billed for the encounter.

## 2018-09-20 ENCOUNTER — Telehealth: Payer: Medicare Other | Admitting: Cardiovascular Disease

## 2018-09-20 ENCOUNTER — Encounter: Payer: Self-pay | Admitting: Cardiovascular Disease

## 2018-09-20 ENCOUNTER — Telehealth (INDEPENDENT_AMBULATORY_CARE_PROVIDER_SITE_OTHER): Payer: Medicare Other | Admitting: Cardiovascular Disease

## 2018-09-20 ENCOUNTER — Telehealth: Payer: Self-pay | Admitting: Cardiovascular Disease

## 2018-09-20 DIAGNOSIS — I4891 Unspecified atrial fibrillation: Secondary | ICD-10-CM | POA: Diagnosis not present

## 2018-09-20 DIAGNOSIS — I77819 Aortic ectasia, unspecified site: Secondary | ICD-10-CM | POA: Diagnosis not present

## 2018-09-20 DIAGNOSIS — F17210 Nicotine dependence, cigarettes, uncomplicated: Secondary | ICD-10-CM

## 2018-09-20 DIAGNOSIS — Z79899 Other long term (current) drug therapy: Secondary | ICD-10-CM

## 2018-09-20 DIAGNOSIS — Z951 Presence of aortocoronary bypass graft: Secondary | ICD-10-CM

## 2018-09-20 DIAGNOSIS — I429 Cardiomyopathy, unspecified: Secondary | ICD-10-CM

## 2018-09-20 DIAGNOSIS — Z950 Presence of cardiac pacemaker: Secondary | ICD-10-CM

## 2018-09-20 DIAGNOSIS — Z7901 Long term (current) use of anticoagulants: Secondary | ICD-10-CM

## 2018-09-20 DIAGNOSIS — I4821 Permanent atrial fibrillation: Secondary | ICD-10-CM | POA: Diagnosis not present

## 2018-09-20 DIAGNOSIS — I25708 Atherosclerosis of coronary artery bypass graft(s), unspecified, with other forms of angina pectoris: Secondary | ICD-10-CM

## 2018-09-20 DIAGNOSIS — I5022 Chronic systolic (congestive) heart failure: Secondary | ICD-10-CM

## 2018-09-20 DIAGNOSIS — R001 Bradycardia, unspecified: Secondary | ICD-10-CM

## 2018-09-20 NOTE — Telephone Encounter (Signed)
° ° ° °  COVID-19 Pre-Screening Questions: ° °• Do you currently have a fever? No °•  °• Have you recently travelled on a cruise, internationally, or to [place], [place], [place], [place], [place], or [place], [place] ([place]) ? No °•  °• Have you been in contact with someone that is currently pending confirmation of Covid19 testing or has been confirmed to have the Covid19 virus? No °•  °• Are you currently experiencing fatigue or cough? No  ° ° °   ° ° ° ° ° °

## 2018-09-20 NOTE — Patient Instructions (Signed)
Medication Instructions:  Your physician recommends that you continue on your current medications as directed. Please refer to the Current Medication list given to you today.   Labwork: NONE   Testing/Procedures: NONE   Follow-Up: Your physician wants you to follow-up in: 6 Months with Dr. Koneswaran. You will receive a reminder letter in the mail two months in advance. If you don't receive a letter, please call our office to schedule the follow-up appointment.   Any Other Special Instructions Will Be Listed Below (If Applicable).     If you need a refill on your cardiac medications before your next appointment, please call your pharmacy.  Thank you for choosing  [facility]!   

## 2018-09-20 NOTE — Progress Notes (Signed)
Virtual Visit via Telephone Note   This visit type was conducted due to national recommendations for restrictions regarding the COVID-19 Pandemic (e.g. social distancing) in an effort to limit this patient's exposure and mitigate transmission in our community.  Due to her co-morbid illnesses, this patient is at least at moderate risk for complications without adequate follow up.  This format is felt to be most appropriate for this patient at this time.  The patient did not have access to video technology/had technical difficulties with video requiring transitioning to audio format only (telephone).  All issues noted in this document were discussed and addressed.  No physical exam could be performed with this format.  Please refer to the patient's chart for her  consent to telehealth for [facility]   Evaluation Performed:  Follow-up visit  Date:  09/20/2018   ID:  Angelica Hernandez, DOB [birth year], MRN [id number]  Patient Location: Home  Provider Location: Home  PCP:  Caryl Bis, MD  Cardiologist:  Kate Sable, MD  Electrophysiologist:  Thompson Grayer, MD   Chief Complaint:  CAD  History of Present Illness:    Angelica Hernandez is a 85 y.o. female who presents via audio/video conferencing for a telehealth visit today.    She has a history of coronary artery bypass graft surgery, sick sinus syndrome, permanent atrial fibrillation, mild dilation of ascending aorta, and pacemaker.  Three-vessel CABG was performed in the 1970s.  She had implantation of a Engineer, production pacemaker in 2008. She underwent generator replacement on 06/15/2017.  The patient denies any symptoms of chest pain, palpitations, shortness of breath, lightheadedness, dizziness, leg swelling, orthopnea, PND, and syncope.  She then told me she took nitro 2 weeks ago but said she really didn't have chest pain. She said she "took it for a feeling in my chest" and said it helped.  Her only complaint  relates to occasional feet cramps at night.  The patient does not have symptoms concerning for COVID-19 infection (fever, chills, cough, or new shortness of breath).   Social history: She was born in [place].  Her father used to work in the [employer].  She moved to [place] when her daughter was 89 years old and she is now over 65 years old.  She moved to [place] in 2019 as her daughter bought a house here.  She has 2 children.  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   Past Medical History:  Diagnosis Date   Anxiety    CAD (coronary artery disease)    CKD (chronic kidney disease)    Gastro-esophageal reflux disease without esophagitis    Hypothyroidism    Ischemic cardiomyopathy    Mild dilation of ascending aorta (HCC)    Moderate protein-calorie malnutrition (HCC)    Permanent atrial fibrillation    Severe episode of recurrent major depressive disorder, without psychotic features ([place])    Symptomatic bradycardia    Past Surgical History:  Procedure Laterality Date   CESAREAN SECTION     CORONARY ARTERY BYPASS GRAFT     OTHER SURGICAL HISTORY     cesearan section    PACEMAKER GENERATOR CHANGE  06/15/2017   [employer] Scientific Accolade SR L 300 ([place] [id number]) implanted by Dr Ernestina Patches at [facility] in [place] for symptomatic bradycardia   PACEMAKER IMPLANT  11/16/2006   PPM implanted for bradycardia     Current Meds  Medication Sig   atorvastatin (LIPITOR) 10  MG tablet Take 10 mg by mouth daily.   Cholecalciferol (VITAMIN D3) 1000 units CAPS Take 1,000 Units by mouth daily.   FERREX 150 150 MG capsule Take 150 mg by mouth daily.    gabapentin (NEURONTIN) 100 MG capsule Take 100 mg by mouth at bedtime.   levothyroxine (SYNTHROID, LEVOTHROID) 50 MCG tablet Take 50 mcg by mouth daily before breakfast.   metoprolol succinate (TOPROL-XL) 50 MG 24 hr tablet Take 50 mg by mouth daily. Take with or  immediately following a meal.   nitroGLYCERIN (NITROSTAT) 0.4 MG SL tablet Place 1 tablet (0.4 mg total) under the tongue every 5 (five) minutes as needed for chest pain.   vitamin C (ASCORBIC ACID) 500 MG tablet Take 500 mg by mouth 2 (two) times daily.    warfarin (COUMADIN) 5 MG tablet Take 5 mg by mouth daily. MANAGED BY PMD     Allergies:   Patient has no known allergies.   Social History   Tobacco Use   Smoking status: Current Every Day Smoker    Packs/day: 0.50    Types: Cigarettes   Smokeless tobacco: Never Used  Substance Use Topics   Alcohol use: Never    Frequency: Never   Drug use: Never     Family Hx: The patient's family history is not on file.  ROS:   Please see the history of present illness.     All other systems reviewed and are negative.   Prior CV studies:   The following studies were reviewed today:  Echocardiogram 02/22/18:  - Left ventricle: The cavity size was normal. Wall thickness was   normal. Systolic function was mildly reduced. The estimated   ejection fraction was in the range of 45% to 50%. Diffuse   hypokinesis. Abnormal global longitudinal strain of -12.2%. The   study was not technically sufficient to allow evaluation of LV   diastolic dysfunction due to atrial fibrillation. - Aortic valve: Mildly calcified annulus. Trileaflet. - Mitral valve: Mildly calcified annulus. There was mild   regurgitation. - Right ventricle: Pacer wire or catheter noted in right ventricle. - Right atrium: Pacer wire or catheter noted in right atrium. - Atrial septum: No defect or patent foramen ovale was identified. - Tricuspid valve: There was moderate regurgitation. - Pulmonary arteries: PA peak pressure: 35 mm Hg (S). - Pericardium, extracardiac: There was no pericardial effusion.  Labs/Other Tests and Data Reviewed:    EKG:  None.  Recent Labs: 07/09/2018: BUN 8; Creatinine, Ser 0.61; Hemoglobin 12.9; Platelets 229; Potassium 4.1; Sodium  137   Recent Lipid Panel No results found for: CHOL, TRIG, HDL, CHOLHDL, LDLCALC, LDLDIRECT  Wt Readings from Last 3 Encounters:  07/09/18 122 lb 5.7 oz (55.5 kg)  03/02/18 122 lb 6.4 oz (55.5 kg)  01/17/18 124 lb (56.2 kg)     Objective:    Vital Signs:  There were no vitals taken for this visit.   Phone visit  ASSESSMENT & PLAN:    1.  Coronary artery disease with three-vessel CABG: Symptomatically stable.  Currently on metoprolol succinate and Lipitor. LVEF 45-50%. Not on ASA as she is on warfarin.  2.  Permanent atrial fibrillation:  Symptomatically stable on Toprol-XL.  Anticoagulated with warfarin.   3.  Mild aortic dilatation: Normal Ao root size by echo in 02/2018.  4.  Pacemaker: Followed by EP.  She underwent generator change in January 2019.  She has a Chiropractor initially implanted in 2008.  5.  Cardiomyopathy/chronic systolic  heart failure: She is symptomatically stable and not in need of diuretics.  She is on metoprolol succinate 50 mg daily and no longer on benazepril 5 mg daily.  No changes to therapy. LVEF 45-50%.   COVID-19 Education: The signs and symptoms of COVID-19 were discussed with the patient and how to seek care for testing (follow up with PCP or arrange E-visit).  The importance of social distancing was discussed today.  Time:   Today, I have spent 15 minutes with the patient with telehealth technology discussing the above problems.     Medication Adjustments/Labs and Tests Ordered: Current medicines are reviewed at length with the patient today.  Concerns regarding medicines are outlined above.  Tests Ordered: No orders of the defined types were placed in this encounter.  Medication Changes: No orders of the defined types were placed in this encounter.   Disposition:  Follow up in 6 month(s)  Signed, Kate Sable, MD  09/20/2018 9:06 AM    [place]

## 2018-09-21 ENCOUNTER — Ambulatory Visit (INDEPENDENT_AMBULATORY_CARE_PROVIDER_SITE_OTHER): Payer: Medicare Other | Admitting: *Deleted

## 2018-09-21 DIAGNOSIS — Z5181 Encounter for therapeutic drug level monitoring: Secondary | ICD-10-CM | POA: Diagnosis not present

## 2018-09-21 DIAGNOSIS — I4891 Unspecified atrial fibrillation: Secondary | ICD-10-CM | POA: Diagnosis not present

## 2018-09-21 LAB — POCT INR: INR: 4.2 — AB (ref 2.0–3.0)

## 2018-09-21 NOTE — Patient Instructions (Signed)
Took coumadin  This morning Hold coumadin on Friday, take 1/2 tablet on Saturday then resume 1 tablet daily starting Sunday Be consistent with Vit K foods Recheck in 4 weeks

## 2018-10-05 DIAGNOSIS — D649 Anemia, unspecified: Secondary | ICD-10-CM | POA: Diagnosis not present

## 2018-10-05 DIAGNOSIS — D509 Iron deficiency anemia, unspecified: Secondary | ICD-10-CM | POA: Diagnosis not present

## 2018-10-05 DIAGNOSIS — D529 Folate deficiency anemia, unspecified: Secondary | ICD-10-CM | POA: Diagnosis not present

## 2018-10-05 DIAGNOSIS — I10 Essential (primary) hypertension: Secondary | ICD-10-CM | POA: Diagnosis not present

## 2018-10-05 DIAGNOSIS — E039 Hypothyroidism, unspecified: Secondary | ICD-10-CM | POA: Diagnosis not present

## 2018-10-09 DIAGNOSIS — E782 Mixed hyperlipidemia: Secondary | ICD-10-CM | POA: Diagnosis not present

## 2018-10-09 DIAGNOSIS — I2510 Atherosclerotic heart disease of native coronary artery without angina pectoris: Secondary | ICD-10-CM | POA: Diagnosis not present

## 2018-10-09 DIAGNOSIS — I10 Essential (primary) hypertension: Secondary | ICD-10-CM | POA: Diagnosis not present

## 2018-10-09 DIAGNOSIS — E039 Hypothyroidism, unspecified: Secondary | ICD-10-CM | POA: Diagnosis not present

## 2018-10-12 DIAGNOSIS — E039 Hypothyroidism, unspecified: Secondary | ICD-10-CM | POA: Diagnosis not present

## 2018-10-12 DIAGNOSIS — I10 Essential (primary) hypertension: Secondary | ICD-10-CM | POA: Diagnosis not present

## 2018-10-12 DIAGNOSIS — E782 Mixed hyperlipidemia: Secondary | ICD-10-CM | POA: Diagnosis not present

## 2018-11-11 DIAGNOSIS — E782 Mixed hyperlipidemia: Secondary | ICD-10-CM | POA: Diagnosis not present

## 2018-11-11 DIAGNOSIS — I10 Essential (primary) hypertension: Secondary | ICD-10-CM | POA: Diagnosis not present

## 2018-11-15 ENCOUNTER — Ambulatory Visit (INDEPENDENT_AMBULATORY_CARE_PROVIDER_SITE_OTHER): Payer: Medicare Other | Admitting: *Deleted

## 2018-11-15 DIAGNOSIS — Z5181 Encounter for therapeutic drug level monitoring: Secondary | ICD-10-CM | POA: Diagnosis not present

## 2018-11-15 DIAGNOSIS — I4891 Unspecified atrial fibrillation: Secondary | ICD-10-CM | POA: Diagnosis not present

## 2018-11-15 LAB — POCT INR: INR: 1.1 — AB (ref 2.0–3.0)

## 2018-11-15 NOTE — Patient Instructions (Signed)
Took coumadin  This morning Take extra 1/2 tablet today, take 1 1/2 tablets tomorrow then resume 1 tablet daily Be consistent with Vit K foods Recheck in 2 weeks

## 2018-12-05 DIAGNOSIS — G6289 Other specified polyneuropathies: Secondary | ICD-10-CM | POA: Diagnosis not present

## 2018-12-12 DIAGNOSIS — E782 Mixed hyperlipidemia: Secondary | ICD-10-CM | POA: Diagnosis not present

## 2018-12-12 DIAGNOSIS — I10 Essential (primary) hypertension: Secondary | ICD-10-CM | POA: Diagnosis not present

## 2018-12-19 ENCOUNTER — Ambulatory Visit (INDEPENDENT_AMBULATORY_CARE_PROVIDER_SITE_OTHER): Payer: Medicare Other | Admitting: *Deleted

## 2018-12-19 DIAGNOSIS — Z5181 Encounter for therapeutic drug level monitoring: Secondary | ICD-10-CM | POA: Diagnosis not present

## 2018-12-19 DIAGNOSIS — I4891 Unspecified atrial fibrillation: Secondary | ICD-10-CM

## 2018-12-19 LAB — POCT INR: INR: 2.8 (ref 2.0–3.0)

## 2018-12-19 NOTE — Patient Instructions (Signed)
Continue coumadin 1 tablet daily Be consistent with Vit K foods Recheck in 3 weeks

## 2019-01-10 ENCOUNTER — Other Ambulatory Visit: Payer: Self-pay

## 2019-01-10 ENCOUNTER — Ambulatory Visit (INDEPENDENT_AMBULATORY_CARE_PROVIDER_SITE_OTHER): Payer: Medicare Other | Admitting: *Deleted

## 2019-01-10 DIAGNOSIS — I4891 Unspecified atrial fibrillation: Secondary | ICD-10-CM

## 2019-01-10 DIAGNOSIS — Z5181 Encounter for therapeutic drug level monitoring: Secondary | ICD-10-CM | POA: Diagnosis not present

## 2019-01-10 LAB — POCT INR: INR: 2.5 (ref 2.0–3.0)

## 2019-01-10 NOTE — Patient Instructions (Signed)
Continue coumadin 1 tablet daily Be consistent with Vit K foods Recheck in 4 weeks

## 2019-01-18 DIAGNOSIS — E782 Mixed hyperlipidemia: Secondary | ICD-10-CM | POA: Diagnosis not present

## 2019-01-18 DIAGNOSIS — I10 Essential (primary) hypertension: Secondary | ICD-10-CM | POA: Diagnosis not present

## 2019-01-18 DIAGNOSIS — I2510 Atherosclerotic heart disease of native coronary artery without angina pectoris: Secondary | ICD-10-CM | POA: Diagnosis not present

## 2019-01-18 DIAGNOSIS — E039 Hypothyroidism, unspecified: Secondary | ICD-10-CM | POA: Diagnosis not present

## 2019-02-12 DIAGNOSIS — E782 Mixed hyperlipidemia: Secondary | ICD-10-CM | POA: Diagnosis not present

## 2019-02-12 DIAGNOSIS — I10 Essential (primary) hypertension: Secondary | ICD-10-CM | POA: Diagnosis not present

## 2019-02-13 ENCOUNTER — Telehealth: Payer: Self-pay | Admitting: Internal Medicine

## 2019-02-13 NOTE — Telephone Encounter (Signed)

## 2019-02-16 ENCOUNTER — Ambulatory Visit: Payer: Medicare Other | Admitting: Internal Medicine

## 2019-02-16 ENCOUNTER — Other Ambulatory Visit: Payer: Self-pay

## 2019-02-16 ENCOUNTER — Encounter: Payer: Self-pay | Admitting: Internal Medicine

## 2019-02-16 VITALS — BP 98/70 | HR 71 | Temp 97.0°F | Ht 66.0 in | Wt 124.0 lb

## 2019-02-16 DIAGNOSIS — I25708 Atherosclerosis of coronary artery bypass graft(s), unspecified, with other forms of angina pectoris: Secondary | ICD-10-CM

## 2019-02-16 DIAGNOSIS — Z951 Presence of aortocoronary bypass graft: Secondary | ICD-10-CM

## 2019-02-16 DIAGNOSIS — I4821 Permanent atrial fibrillation: Secondary | ICD-10-CM

## 2019-02-16 DIAGNOSIS — R001 Bradycardia, unspecified: Secondary | ICD-10-CM

## 2019-02-16 LAB — CUP PACEART INCLINIC DEVICE CHECK
Date Time Interrogation Session: 20200904092743
Implantable Lead Implant Date: 20080604
Implantable Lead Location: 753860
Implantable Lead Model: 4136
Implantable Lead Serial Number: 28318760
Implantable Pulse Generator Implant Date: 20190102
Pulse Gen Serial Number: 720516

## 2019-02-16 NOTE — Progress Notes (Signed)
PCP: Caryl Bis, MD Primary Cardiologist: Dr Bronson Ing Primary EP:  Dr Angelica Hernandez is a 85 y.o. female who presents today for routine electrophysiology followup.  Since last being seen in our clinic, the patient reports doing very well.  Today, she denies symptoms of palpitations, chest pain, shortness of breath,  lower extremity edema, dizziness, presyncope, or syncope.  The patient is otherwise without complaint today.   Past Medical History:  Diagnosis Date  . Anxiety   . CAD (coronary artery disease)   . CKD (chronic kidney disease)   . Gastro-esophageal reflux disease without esophagitis   . Hypothyroidism   . Ischemic cardiomyopathy   . Mild dilation of ascending aorta (HCC)   . Moderate protein-calorie malnutrition ([place])   . Permanent atrial fibrillation   . Severe episode of recurrent major depressive disorder, without psychotic features ([place])   . Symptomatic bradycardia    Past Surgical History:  Procedure Laterality Date  . CESAREAN SECTION    . CORONARY ARTERY BYPASS GRAFT    . OTHER SURGICAL HISTORY     cesearan section   . PACEMAKER GENERATOR CHANGE  06/15/2017   [employer] Scientific Accolade SR L 300 ([place] [id number]) implanted by Dr Ernestina Patches at [facility] in [place] for symptomatic bradycardia  . PACEMAKER IMPLANT  11/16/2006   PPM implanted for bradycardia    ROS- all systems are reviewed and negative except as per HPI above  Current Outpatient Medications  Medication Sig Dispense Refill  . atorvastatin (LIPITOR) 10 MG tablet Take 10 mg by mouth daily.    . Cholecalciferol (VITAMIN D3) 1000 units CAPS Take 1,000 Units by mouth daily.    Marland Kitchen FERREX 150 150 MG capsule Take 150 mg by mouth daily.   1  . gabapentin (NEURONTIN) 100 MG capsule Take 100 mg by mouth at bedtime.    Marland Kitchen levothyroxine (SYNTHROID, LEVOTHROID) 50 MCG tablet Take 50 mcg by mouth daily before breakfast.    . metoprolol succinate (TOPROL-XL) 50 MG 24 hr  tablet Take 50 mg by mouth daily. Take with or immediately following a meal.    . nitroGLYCERIN (NITROSTAT) 0.4 MG SL tablet Place 1 tablet (0.4 mg total) under the tongue every 5 (five) minutes as needed for chest pain. 25 tablet 3  . vitamin C (ASCORBIC ACID) 500 MG tablet Take 500 mg by mouth 2 (two) times daily.     Marland Kitchen warfarin (COUMADIN) 5 MG tablet Take 5 mg by mouth daily. MANAGED BY PMD     No current facility-administered medications for this visit.     Physical Exam: Vitals:   02/16/19 0909  BP: 98/70  Pulse: 71  Temp: (!) 97 F (36.1 C)  SpO2: 97%  Weight: 124 lb (56.2 kg)  Height: 5\' 6"  (1.676 m)    GEN- The patient is well appearing, alert and oriented x 3 today.   Head- normocephalic, atraumatic Eyes-  Sclera clear, conjunctiva pink Ears- hearing intact Oropharynx- clear Lungs-  normal work of breathing Chest- pacemaker pocket is well healed Heart- Regular rate and rhythm  GI- soft  Extremities- no clubbing, cyanosis, or edema  Pacemaker interrogation- reviewed in detail today,  See PACEART report   Assessment and Plan:  1. Symptomatic bradycardia  Normal pacemaker function See Pace Art report No changes today she is not device dependant today  2. Permanent afib Rate controlled Further rate control currently limited by BP On coumadin for chads2vasc score of 5  3.  CAD No ischemic symptoms  4. Tobacco Smoking cessation advised  5. claudicaiton She has occasional symptoms I have advised further workup which she declines I have advised smoking cessation which she declines Her foot exam is normal today, except that DP/PT pulses are diminished  [place] Follow-up with me in [place] in a year  Thompson Grayer MD, [facility] 02/16/2019 9:26 AM

## 2019-02-16 NOTE — Patient Instructions (Signed)
Medication Instructions:  Continue all current medications.  Labwork: none  Testing/Procedures: none  Follow-Up: 1 year - Dr.  Allred   Any Other Special Instructions Will Be Listed Below (If Applicable).   If you need a refill on your cardiac medications before your next appointment, please call your pharmacy.  

## 2019-03-01 ENCOUNTER — Other Ambulatory Visit: Payer: Self-pay

## 2019-03-01 ENCOUNTER — Ambulatory Visit (INDEPENDENT_AMBULATORY_CARE_PROVIDER_SITE_OTHER): Payer: Medicare Other | Admitting: *Deleted

## 2019-03-01 DIAGNOSIS — Z5181 Encounter for therapeutic drug level monitoring: Secondary | ICD-10-CM

## 2019-03-01 DIAGNOSIS — I4821 Permanent atrial fibrillation: Secondary | ICD-10-CM | POA: Diagnosis not present

## 2019-03-01 LAB — POCT INR: INR: 3.5 — AB (ref 2.0–3.0)

## 2019-03-01 NOTE — Patient Instructions (Signed)
Hold coumadin tomorrow then resume 1 tablet daily (took coumadin this morning) Be consistent with Vit K foods Recheck in 4 weeks

## 2019-03-15 DIAGNOSIS — R17 Unspecified jaundice: Secondary | ICD-10-CM

## 2019-03-15 HISTORY — DX: Unspecified jaundice: R17

## 2019-03-22 ENCOUNTER — Inpatient Hospital Stay (HOSPITAL_COMMUNITY)
Admission: EM | Admit: 2019-03-22 | Discharge: 2019-03-27 | DRG: 378 | Disposition: A | Discharge status: Home / Self Care | Payer: Medicare Other | Attending: Internal Medicine | Admitting: Internal Medicine

## 2019-03-22 ENCOUNTER — Emergency Department (HOSPITAL_COMMUNITY): Payer: Medicare Other

## 2019-03-22 ENCOUNTER — Other Ambulatory Visit: Payer: Self-pay

## 2019-03-22 ENCOUNTER — Encounter (HOSPITAL_COMMUNITY): Payer: Self-pay

## 2019-03-22 DIAGNOSIS — I4891 Unspecified atrial fibrillation: Secondary | ICD-10-CM

## 2019-03-22 DIAGNOSIS — D6859 Other primary thrombophilia: Secondary | ICD-10-CM | POA: Diagnosis not present

## 2019-03-22 DIAGNOSIS — R7989 Other specified abnormal findings of blood chemistry: Secondary | ICD-10-CM

## 2019-03-22 DIAGNOSIS — D649 Anemia, unspecified: Secondary | ICD-10-CM | POA: Diagnosis not present

## 2019-03-22 DIAGNOSIS — K449 Diaphragmatic hernia without obstruction or gangrene: Secondary | ICD-10-CM | POA: Diagnosis present

## 2019-03-22 DIAGNOSIS — Z7901 Long term (current) use of anticoagulants: Secondary | ICD-10-CM

## 2019-03-22 DIAGNOSIS — R1084 Generalized abdominal pain: Secondary | ICD-10-CM | POA: Diagnosis not present

## 2019-03-22 DIAGNOSIS — K222 Esophageal obstruction: Secondary | ICD-10-CM | POA: Diagnosis present

## 2019-03-22 DIAGNOSIS — R300 Dysuria: Secondary | ICD-10-CM | POA: Diagnosis present

## 2019-03-22 DIAGNOSIS — Z03818 Encounter for observation for suspected exposure to other biological agents ruled out: Secondary | ICD-10-CM | POA: Diagnosis not present

## 2019-03-22 DIAGNOSIS — D62 Acute posthemorrhagic anemia: Secondary | ICD-10-CM | POA: Diagnosis present

## 2019-03-22 DIAGNOSIS — Z87448 Personal history of other diseases of urinary system: Secondary | ICD-10-CM

## 2019-03-22 DIAGNOSIS — K5730 Diverticulosis of large intestine without perforation or abscess without bleeding: Secondary | ICD-10-CM | POA: Diagnosis not present

## 2019-03-22 DIAGNOSIS — R791 Abnormal coagulation profile: Secondary | ICD-10-CM | POA: Diagnosis present

## 2019-03-22 DIAGNOSIS — K254 Chronic or unspecified gastric ulcer with hemorrhage: Secondary | ICD-10-CM | POA: Diagnosis not present

## 2019-03-22 DIAGNOSIS — K922 Gastrointestinal hemorrhage, unspecified: Secondary | ICD-10-CM | POA: Diagnosis not present

## 2019-03-22 DIAGNOSIS — S199XXA Unspecified injury of neck, initial encounter: Secondary | ICD-10-CM | POA: Diagnosis not present

## 2019-03-22 DIAGNOSIS — Z79890 Hormone replacement therapy (postmenopausal): Secondary | ICD-10-CM

## 2019-03-22 DIAGNOSIS — D6832 Hemorrhagic disorder due to extrinsic circulating anticoagulants: Secondary | ICD-10-CM | POA: Diagnosis present

## 2019-03-22 DIAGNOSIS — R109 Unspecified abdominal pain: Secondary | ICD-10-CM | POA: Diagnosis not present

## 2019-03-22 DIAGNOSIS — R42 Dizziness and giddiness: Secondary | ICD-10-CM | POA: Diagnosis not present

## 2019-03-22 DIAGNOSIS — S4992XA Unspecified injury of left shoulder and upper arm, initial encounter: Secondary | ICD-10-CM | POA: Diagnosis not present

## 2019-03-22 DIAGNOSIS — K921 Melena: Secondary | ICD-10-CM | POA: Diagnosis not present

## 2019-03-22 DIAGNOSIS — R7401 Elevation of levels of liver transaminase levels: Secondary | ICD-10-CM | POA: Diagnosis present

## 2019-03-22 DIAGNOSIS — R05 Cough: Secondary | ICD-10-CM | POA: Diagnosis present

## 2019-03-22 DIAGNOSIS — K5731 Diverticulosis of large intestine without perforation or abscess with bleeding: Principal | ICD-10-CM | POA: Diagnosis present

## 2019-03-22 DIAGNOSIS — I499 Cardiac arrhythmia, unspecified: Secondary | ICD-10-CM | POA: Diagnosis not present

## 2019-03-22 DIAGNOSIS — R0902 Hypoxemia: Secondary | ICD-10-CM | POA: Diagnosis not present

## 2019-03-22 DIAGNOSIS — K219 Gastro-esophageal reflux disease without esophagitis: Secondary | ICD-10-CM | POA: Diagnosis present

## 2019-03-22 DIAGNOSIS — I2510 Atherosclerotic heart disease of native coronary artery without angina pectoris: Secondary | ICD-10-CM | POA: Diagnosis present

## 2019-03-22 DIAGNOSIS — R296 Repeated falls: Secondary | ICD-10-CM | POA: Diagnosis not present

## 2019-03-22 DIAGNOSIS — K259 Gastric ulcer, unspecified as acute or chronic, without hemorrhage or perforation: Secondary | ICD-10-CM | POA: Diagnosis not present

## 2019-03-22 DIAGNOSIS — Z20828 Contact with and (suspected) exposure to other viral communicable diseases: Secondary | ICD-10-CM | POA: Diagnosis present

## 2019-03-22 DIAGNOSIS — E876 Hypokalemia: Secondary | ICD-10-CM | POA: Diagnosis not present

## 2019-03-22 DIAGNOSIS — K31811 Angiodysplasia of stomach and duodenum with bleeding: Secondary | ICD-10-CM | POA: Diagnosis present

## 2019-03-22 DIAGNOSIS — Z951 Presence of aortocoronary bypass graft: Secondary | ICD-10-CM

## 2019-03-22 DIAGNOSIS — I255 Ischemic cardiomyopathy: Secondary | ICD-10-CM | POA: Diagnosis present

## 2019-03-22 DIAGNOSIS — Z833 Family history of diabetes mellitus: Secondary | ICD-10-CM

## 2019-03-22 DIAGNOSIS — S299XXA Unspecified injury of thorax, initial encounter: Secondary | ICD-10-CM | POA: Diagnosis not present

## 2019-03-22 DIAGNOSIS — Z950 Presence of cardiac pacemaker: Secondary | ICD-10-CM | POA: Diagnosis not present

## 2019-03-22 DIAGNOSIS — K31819 Angiodysplasia of stomach and duodenum without bleeding: Secondary | ICD-10-CM | POA: Diagnosis not present

## 2019-03-22 DIAGNOSIS — K2950 Unspecified chronic gastritis without bleeding: Secondary | ICD-10-CM | POA: Diagnosis not present

## 2019-03-22 DIAGNOSIS — Z90710 Acquired absence of both cervix and uterus: Secondary | ICD-10-CM

## 2019-03-22 DIAGNOSIS — R945 Abnormal results of liver function studies: Secondary | ICD-10-CM

## 2019-03-22 DIAGNOSIS — E039 Hypothyroidism, unspecified: Secondary | ICD-10-CM | POA: Diagnosis present

## 2019-03-22 DIAGNOSIS — K3189 Other diseases of stomach and duodenum: Secondary | ICD-10-CM | POA: Diagnosis not present

## 2019-03-22 DIAGNOSIS — T45515A Adverse effect of anticoagulants, initial encounter: Secondary | ICD-10-CM | POA: Diagnosis present

## 2019-03-22 DIAGNOSIS — K838 Other specified diseases of biliary tract: Secondary | ICD-10-CM | POA: Diagnosis not present

## 2019-03-22 DIAGNOSIS — S0990XA Unspecified injury of head, initial encounter: Secondary | ICD-10-CM | POA: Diagnosis not present

## 2019-03-22 DIAGNOSIS — B9681 Helicobacter pylori [H. pylori] as the cause of diseases classified elsewhere: Secondary | ICD-10-CM | POA: Diagnosis not present

## 2019-03-22 DIAGNOSIS — I4821 Permanent atrial fibrillation: Secondary | ICD-10-CM | POA: Diagnosis present

## 2019-03-22 DIAGNOSIS — D72829 Elevated white blood cell count, unspecified: Secondary | ICD-10-CM | POA: Diagnosis present

## 2019-03-22 DIAGNOSIS — R079 Chest pain, unspecified: Secondary | ICD-10-CM | POA: Diagnosis not present

## 2019-03-22 DIAGNOSIS — F332 Major depressive disorder, recurrent severe without psychotic features: Secondary | ICD-10-CM | POA: Diagnosis present

## 2019-03-22 DIAGNOSIS — F17210 Nicotine dependence, cigarettes, uncomplicated: Secondary | ICD-10-CM | POA: Diagnosis present

## 2019-03-22 DIAGNOSIS — Z79899 Other long term (current) drug therapy: Secondary | ICD-10-CM

## 2019-03-22 DIAGNOSIS — R410 Disorientation, unspecified: Secondary | ICD-10-CM | POA: Diagnosis not present

## 2019-03-22 DIAGNOSIS — Z8249 Family history of ischemic heart disease and other diseases of the circulatory system: Secondary | ICD-10-CM

## 2019-03-22 LAB — COMPREHENSIVE METABOLIC PANEL
ALT: 55 U/L — ABNORMAL HIGH (ref 0–44)
AST: 78 U/L — ABNORMAL HIGH (ref 15–41)
Albumin: 3.4 g/dL — ABNORMAL LOW (ref 3.5–5.0)
Alkaline Phosphatase: 169 U/L — ABNORMAL HIGH (ref 38–126)
Anion gap: 12 (ref 5–15)
BUN: 23 mg/dL (ref 8–23)
CO2: 21 mmol/L — ABNORMAL LOW (ref 22–32)
Calcium: 8.5 mg/dL — ABNORMAL LOW (ref 8.9–10.3)
Chloride: 108 mmol/L (ref 98–111)
Creatinine, Ser: 0.61 mg/dL (ref 0.44–1.00)
GFR calc Af Amer: >60 mL/min (ref >60)
GFR calc non Af Amer: >60 mL/min (ref >60)
Glucose, Bld: 127 mg/dL — ABNORMAL HIGH (ref 70–99)
Potassium: 3.9 mmol/L (ref 3.5–5.1)
Sodium: 141 mmol/L (ref 135–145)
Total Bilirubin: 2 mg/dL — ABNORMAL HIGH (ref 0.3–1.2)
Total Protein: 6 g/dL — ABNORMAL LOW (ref 6.5–8.1)

## 2019-03-22 LAB — CBC WITH DIFFERENTIAL/PLATELET
Abs Immature Granulocytes: 0.07 10*3/uL (ref 0.00–0.07)
Basophils Absolute: 0.1 10*3/uL (ref 0.0–0.1)
Basophils Relative: 0 %
Eosinophils Absolute: 0.6 10*3/uL — ABNORMAL HIGH (ref 0.0–0.5)
Eosinophils Relative: 4 %
HCT: 26.4 % — ABNORMAL LOW (ref 36.0–46.0)
Hemoglobin: 8.3 g/dL — ABNORMAL LOW (ref 12.0–15.0)
Immature Granulocytes: 1 %
Lymphocytes Relative: 11 %
Lymphs Abs: 1.6 10*3/uL (ref 0.7–4.0)
MCH: 30.4 pg (ref 26.0–34.0)
MCHC: 31.4 g/dL (ref 30.0–36.0)
MCV: 96.7 fL (ref 80.0–100.0)
Monocytes Absolute: 1.1 10*3/uL — ABNORMAL HIGH (ref 0.1–1.0)
Monocytes Relative: 8 %
Neutro Abs: 11.1 10*3/uL — ABNORMAL HIGH (ref 1.7–7.7)
Neutrophils Relative %: 76 %
Platelets: 224 10*3/uL (ref 150–400)
RBC: 2.73 MIL/uL — ABNORMAL LOW (ref 3.87–5.11)
RDW: 14.5 % (ref 11.5–15.5)
WBC: 14.5 10*3/uL — ABNORMAL HIGH (ref 4.0–10.5)
nRBC: 0 % (ref 0.0–0.2)

## 2019-03-22 LAB — ABO/RH: ABO/RH(D): A POS

## 2019-03-22 LAB — URINALYSIS, ROUTINE W REFLEX MICROSCOPIC
Bilirubin Urine: NEGATIVE
Glucose, UA: NEGATIVE mg/dL
Hgb urine dipstick: NEGATIVE
Ketones, ur: NEGATIVE mg/dL
Leukocytes,Ua: NEGATIVE
Nitrite: NEGATIVE
Protein, ur: NEGATIVE mg/dL
Specific Gravity, Urine: >1.046 — ABNORMAL HIGH (ref 1.005–1.030)
pH: 5 (ref 5.0–8.0)

## 2019-03-22 LAB — PROTIME-INR
INR: >10 (ref 0.8–1.2)
Prothrombin Time: >90 seconds — ABNORMAL HIGH (ref 11.4–15.2)

## 2019-03-22 LAB — HEMOGLOBIN AND HEMATOCRIT, BLOOD
HCT: 19 % — ABNORMAL LOW (ref 36.0–46.0)
Hemoglobin: 6.5 g/dL — CL (ref 12.0–15.0)

## 2019-03-22 LAB — TSH: TSH: 1.464 u[IU]/mL (ref 0.350–4.500)

## 2019-03-22 LAB — POC OCCULT BLOOD, ED: Fecal Occult Bld: POSITIVE — AB

## 2019-03-22 LAB — LIPASE, BLOOD: Lipase: 18 U/L (ref 11–51)

## 2019-03-22 LAB — TROPONIN I (HIGH SENSITIVITY): Troponin I (High Sensitivity): 8 ng/L (ref <18)

## 2019-03-22 LAB — MAGNESIUM: Magnesium: 1.8 mg/dL (ref 1.7–2.4)

## 2019-03-22 LAB — AMMONIA: Ammonia: 21 umol/L (ref 9–35)

## 2019-03-22 LAB — PREPARE RBC (CROSSMATCH)

## 2019-03-22 MED ORDER — ATORVASTATIN CALCIUM 10 MG PO TABS: 10.0000 mg | ORAL_TABLET | Freq: Every day | ORAL | Status: DC

## 2019-03-22 MED ORDER — LEVOTHYROXINE SODIUM 50 MCG PO TABS
50.0000 ug | ORAL_TABLET | Freq: Every day | ORAL | Status: DC
  Administered 2019-03-23 – 2019-03-26 (×4): 50 ug via ORAL
  Filled 2019-03-22 (×5): qty 2

## 2019-03-22 MED ORDER — METOPROLOL TARTRATE 5 MG/5ML IV SOLN
5.0000 mg | Freq: Once | INTRAVENOUS | Status: AC
  Administered 2019-03-22: 5 mg via INTRAVENOUS
  Filled 2019-03-22: qty 5

## 2019-03-22 MED ORDER — SODIUM CHLORIDE 0.9% IV SOLUTION
Freq: Once | INTRAVENOUS | Status: AC
  Administered 2019-03-22: via INTRAVENOUS

## 2019-03-22 MED ORDER — VITAMIN K1 1 MG/0.5ML IJ SOLN
INTRAMUSCULAR | Status: AC
  Filled 2019-03-22: qty 1.5

## 2019-03-22 MED ORDER — LEVOTHYROXINE SODIUM 25 MCG PO TABS: 50.0000 ug | ORAL_TABLET | Freq: Every day | ORAL | Status: DC

## 2019-03-22 MED ORDER — DULOXETINE HCL 30 MG PO CPEP
30.0000 mg | ORAL_CAPSULE | Freq: Every day | ORAL | Status: DC
  Administered 2019-03-23 – 2019-03-27 (×5): 30 mg via ORAL
  Filled 2019-03-22 (×5): qty 1

## 2019-03-22 MED ORDER — POLYETHYLENE GLYCOL 3350 17 G PO PACK: 17.0000 g | PACK | Freq: Every day | ORAL | Status: DC | PRN

## 2019-03-22 MED ORDER — ACETAMINOPHEN 650 MG RE SUPP: 650.0000 mg | Freq: Four times a day (QID) | RECTAL | Status: DC | PRN

## 2019-03-22 MED ORDER — VITAMIN K1 10 MG/ML IJ SOLN
2.5000 mg | Freq: Once | INTRAVENOUS | Status: AC
  Administered 2019-03-22: 16:00:00 2.5 mg via INTRAVENOUS
  Filled 2019-03-22: qty 0.25

## 2019-03-22 MED ORDER — ACETAMINOPHEN 325 MG PO TABS: 650.0000 mg | ORAL_TABLET | Freq: Four times a day (QID) | ORAL | Status: DC | PRN

## 2019-03-22 MED ORDER — DILTIAZEM LOAD VIA INFUSION
10.0000 mg | Freq: Once | INTRAVENOUS | Status: AC
  Administered 2019-03-22: 22:00:00 10 mg via INTRAVENOUS
  Filled 2019-03-22: qty 10

## 2019-03-22 MED ORDER — METOPROLOL TARTRATE 5 MG/5ML IV SOLN
5.0000 mg | INTRAVENOUS | Status: DC | PRN
  Administered 2019-03-22: 5 mg via INTRAVENOUS
  Filled 2019-03-22: qty 5

## 2019-03-22 MED ORDER — DILTIAZEM HCL-DEXTROSE 125-5 MG/125ML-% IV SOLN (PREMIX)
5.0000 mg/h | INTRAVENOUS | Status: DC
  Administered 2019-03-22 – 2019-03-23 (×2): 5 mg/h via INTRAVENOUS
  Filled 2019-03-22 (×2): qty 125

## 2019-03-22 MED ORDER — CHLORHEXIDINE GLUCONATE CLOTH 2 % EX PADS
6.0000 | MEDICATED_PAD | Freq: Every day | CUTANEOUS | Status: DC
  Administered 2019-03-24 – 2019-03-27 (×4): 6 via TOPICAL

## 2019-03-22 MED ORDER — METOPROLOL TARTRATE 5 MG/5ML IV SOLN: 5.0000 mg | INTRAVENOUS | Status: DC | PRN

## 2019-03-22 MED ORDER — METOPROLOL TARTRATE 50 MG PO TABS
100.0000 mg | ORAL_TABLET | Freq: Two times a day (BID) | ORAL | Status: DC
  Administered 2019-03-22 – 2019-03-27 (×9): 100 mg via ORAL
  Filled 2019-03-22: qty 2
  Filled 2019-03-22: qty 4
  Filled 2019-03-22 (×5): qty 2
  Filled 2019-03-22: qty 4
  Filled 2019-03-22 (×2): qty 2

## 2019-03-22 MED ORDER — ONDANSETRON HCL 4 MG/2ML IJ SOLN: 4.0000 mg | Freq: Four times a day (QID) | INTRAMUSCULAR | Status: DC | PRN

## 2019-03-22 MED ORDER — PANTOPRAZOLE SODIUM 40 MG IV SOLR
40.0000 mg | Freq: Two times a day (BID) | INTRAVENOUS | Status: DC
  Administered 2019-03-23 – 2019-03-27 (×7): 40 mg via INTRAVENOUS
  Filled 2019-03-22 (×8): qty 40

## 2019-03-22 MED ORDER — ONDANSETRON HCL 4 MG PO TABS: 4.0000 mg | ORAL_TABLET | Freq: Four times a day (QID) | ORAL | Status: DC | PRN

## 2019-03-22 MED ORDER — SODIUM CHLORIDE 0.9 % IV SOLN
INTRAVENOUS | Status: AC
  Administered 2019-03-22: 22:00:00 1000 mL via INTRAVENOUS
  Administered 2019-03-23: 11:00:00 via INTRAVENOUS

## 2019-03-22 MED ORDER — SODIUM CHLORIDE 0.9 % IV SOLN
1000.0000 mL | INTRAVENOUS | Status: DC
  Administered 2019-03-22: 15:00:00 500 mL via INTRAVENOUS

## 2019-03-22 MED ORDER — SODIUM CHLORIDE 0.9 % IV BOLUS (SEPSIS)
500.0000 mL | Freq: Once | INTRAVENOUS | Status: AC
  Administered 2019-03-22: 15:00:00 500 mL via INTRAVENOUS

## 2019-03-22 MED ORDER — VITAMIN K1 10 MG/ML IJ SOLN
2.5000 mg | Freq: Once | INTRAVENOUS | Status: AC
  Administered 2019-03-22: 2.5 mg via INTRAVENOUS
  Filled 2019-03-22: qty 0.25

## 2019-03-22 MED ORDER — IOHEXOL 300 MG/ML  SOLN
75.0000 mL | Freq: Once | INTRAMUSCULAR | Status: AC | PRN
  Administered 2019-03-22: 75 mL via INTRAVENOUS

## 2019-03-22 MED ORDER — METOPROLOL TARTRATE 5 MG/5ML IV SOLN
5.0000 mg | Freq: Once | INTRAVENOUS | Status: AC
  Administered 2019-03-22: 13:00:00 5 mg via INTRAVENOUS
  Filled 2019-03-22: qty 5

## 2019-03-22 MED ORDER — PANTOPRAZOLE SODIUM 40 MG IV SOLR
40.0000 mg | INTRAVENOUS | Status: DC
  Administered 2019-03-22: 20:00:00 40 mg via INTRAVENOUS
  Filled 2019-03-22: qty 40

## 2019-03-22 NOTE — ED Notes (Signed)
EKG handed to Dr. Laverta Baltimore

## 2019-03-22 NOTE — ED Notes (Signed)
Received call from [employer]. Pt has a single chamber pacemaker. Battery is good, she had a run of afib this morning. No evidence of lethal arrhythmias. Rep to fax report.

## 2019-03-22 NOTE — ED Triage Notes (Signed)
Pt brought to ED by [facility] for fall. Per EMS, called out for fall in bathroom, pale upon arrival, straining with BM, afib on monitor, gave zofran 4mg .  She c/o abdominal pain. Per family, pt has had blood in stool since yesterday.

## 2019-03-22 NOTE — ED Notes (Signed)
Date and time results received: 03/22/19 1525 (use smartphrase ".now" to insert current time)  Test: pt and INR Critical Value: >90 and >10  Name of Provider Notified: bryant pa Orders Received? Or Actions Taken?: see chart

## 2019-03-22 NOTE — H&P (Addendum)
History and Physical    Angelica Hernandez [id number] DOB: [birth year] DOA: 03/22/2019  PCP: Caryl Bis, MD   Patient coming from: Home  I have personally briefly reviewed patient's old medical records in [place]  Chief Complaint: Falls, abdominal pain.  HPI: Angelica Hernandez is a 85 y.o. female with medical history significant for  Atria fib, pacemaker status, depression, ischemic cardiomyopathy, coronary artery disease.  Patient was brought to the ED via EMS with reports of a fall.  Patient is alone, she is able to answer questions.  She tells me she has had abdominal pain for 2 days now-Upper abdomen.  Patient reports she fell yesterday and today.  She reports dizziness when standing, which caused her fall yesterday.  Today she is unable to tell me the circumstances surrounding her fall, but per notes patient was straining to have a bowel movement when she fell.  She hit the back of her head when she fell.  She also reports a dry cough of 2 days, no difficulty breathing.  She denies chest pain.  She is unaware of rapid heart rate. Patient reports yesterday and today she had bowel movements that had small amount of blood.  She denies black stools.  No vomiting of blood.  ED Course: Heart rates up to 130s that improved with 5 mg IV metoprolol, blood pressure systolic mostly 0000000 to [id number].  INR greater than 10.  Mildly elevated liver enzymes AST ALT ALP and T bili.  Leukocytosis of 14.  Portable chest x-ray and left humerus x-ray negative for acute abnormality.  Head and cervical CT no acute abnormality.  Abdominal CT-mild intra-and extrahepatic biliary ductal dilatation.  No definite pancreatic head mass.  Further evaluation with abdominal MRI and MRCP should be considered.  Patient was given 2.5 mg IV vitamin K.  5 regular normal saline bolus. EDP spoke with Dr. Joylene John, he will see patient in a.m., okay to admit here for now but may need transfer for further evaluation with MRCP.   Review  of Systems: As per HPI all other systems reviewed and negative.  Past Medical History:  Diagnosis Date   Anxiety    CAD (coronary artery disease)    CKD (chronic kidney disease)    Gastro-esophageal reflux disease without esophagitis    Hypothyroidism    Ischemic cardiomyopathy    Mild dilation of ascending aorta (HCC)    Moderate protein-calorie malnutrition (HCC)    Permanent atrial fibrillation (HCC)    Severe episode of recurrent major depressive disorder, without psychotic features ([place])    Symptomatic bradycardia     Past Surgical History:  Procedure Laterality Date   CESAREAN SECTION     CORONARY ARTERY BYPASS GRAFT     OTHER SURGICAL HISTORY     cesearan section    PACEMAKER GENERATOR CHANGE  06/15/2017   [employer] Accolade SR L 300 ([place] [id number]) implanted by Dr Ernestina Patches at [facility] in [place] for symptomatic bradycardia   PACEMAKER IMPLANT  11/16/2006   PPM implanted for bradycardia     reports that she has been smoking cigarettes. She has been smoking about 0.50 packs per day. She has never used smokeless tobacco. She reports that she does not drink alcohol or use drugs.  No Known Allergies  Family History  Problem Relation Age of Onset   Heart disease Mother    Hypertension Mother    Diabetes Mother    Hypertension Father    Heart disease Father  Hypertension Brother     Prior to Admission medications   Medication Sig Start Date End Date Taking? Authorizing Provider  atorvastatin (LIPITOR) 10 MG tablet Take 10 mg by mouth daily.    [provider]  benazepril (LOTENSIN) 5 MG tablet Take 2.5 mg by mouth daily. 02/20/19   [provider]  Cholecalciferol (VITAMIN D3) 1000 units CAPS Take 1,000 Units by mouth daily.    [provider]  DULoxetine (CYMBALTA) 30 MG capsule Take 1 capsule by mouth daily. 03/01/19   [provider]  FERREX 150 150 MG capsule Take 150 mg  by mouth daily.  02/07/18   [provider]  gabapentin (NEURONTIN) 100 MG capsule Take 100 mg by mouth at bedtime.    [provider]  gabapentin (NEURONTIN) 300 MG capsule Take 300 mg by mouth at bedtime. For pain/sleep 01/18/19   [provider]  levothyroxine (SYNTHROID, LEVOTHROID) 50 MCG tablet Take 50 mcg by mouth daily before breakfast.    [provider]  metoprolol succinate (TOPROL-XL) 50 MG 24 hr tablet Take 50 mg by mouth daily. Take with or immediately following a meal.    [provider]  nitroGLYCERIN (NITROSTAT) 0.4 MG SL tablet Place 1 tablet (0.4 mg total) under the tongue every 5 (five) minutes as needed for chest pain. 09/18/18   Herminio Commons, MD  vitamin C (ASCORBIC ACID) 500 MG tablet Take 500 mg by mouth 2 (two) times daily.     [provider]  warfarin (COUMADIN) 5 MG tablet Take 5 mg by mouth daily. MANAGED BY PMD    [provider]    Physical Exam: Vitals:   03/22/19 1515 03/22/19 1530 03/22/19 1545 03/22/19 1630  BP:  125/82  99/73  Pulse:   (!) 56 (!) 57  Resp: 12 15 13    Temp:      TempSrc:      SpO2:   94% 94%  Weight:      Height:        Constitutional: Appears pale, calm, comfortable Vitals:   03/22/19 1515 03/22/19 1530 03/22/19 1545 03/22/19 1630  BP:  125/82  99/73  Pulse:   (!) 56 (!) 57  Resp: 12 15 13    Temp:      TempSrc:      SpO2:   94% 94%  Weight:      Height:       Eyes: PERRL, lids and conjunctivae normal ENMT: Mucous membranes are moist. Posterior pharynx clear of any exudate or lesions. Neck: normal, supple, no masses, no thyromegaly Respiratory: clear to auscultation bilaterally, no wheezing, no crackles. Normal respiratory effort. No accessory muscle use.  Cardiovascular: Tachycardic, irregular, No extremity edema. 2+ pedal pulses.  Abdomen: no tenderness, soft, no masses palpated. No hepatosplenomegaly. Bowel sounds positive.  Musculoskeletal: no clubbing /  cyanosis. No joint deformity upper and lower extremities. Good ROM, no contractures. Normal muscle tone.  Skin: no rashes, lesions, ulcers. No induration Neurologic: CN 2-12 grossly intact.  5/5 strength in all 4 extremities.  Psychiatric: Normal judgment and insight. Alert and oriented x 3. Normal mood.   Labs on Admission: I have personally reviewed following labs and imaging studies  CBC: Recent Labs  Lab 03/22/19 1248  WBC 14.5*  NEUTROABS 11.1*  HGB 8.3*  HCT 26.4*  MCV 96.7  PLT [id number]   Basic Metabolic Panel: Recent Labs  Lab 03/22/19 1248  NA 141  K 3.9  CL 108  CO2 21*  GLUCOSE 127*  BUN 23  CREATININE 0.61  CALCIUM 8.5*   GFR: Estimated Creatinine Clearance: 42.7 mL/min (by C-G formula based on SCr of 0.61 mg/dL). Liver Function Tests: Recent Labs  Lab 03/22/19 1248  AST 78*  ALT 55*  ALKPHOS 169*  BILITOT 2.0*  PROT 6.0*  ALBUMIN 3.4*    Recent Labs  Lab 03/22/19 1248  AMMONIA 21   Coagulation Profile: Recent Labs  Lab 03/22/19 1440  INR >10.0*   Urine analysis:    Component Value Date/Time   COLORURINE YELLOW 03/22/2019 1226   APPEARANCEUR HAZY (A) 03/22/2019 1226   LABSPEC >1.046 (H) 03/22/2019 1226   PHURINE 5.0 03/22/2019 1226   GLUCOSEU NEGATIVE 03/22/2019 1226   HGBUR NEGATIVE 03/22/2019 1226   BILIRUBINUR NEGATIVE 03/22/2019 1226   KETONESUR NEGATIVE 03/22/2019 1226   PROTEINUR NEGATIVE 03/22/2019 1226   NITRITE NEGATIVE 03/22/2019 1226   LEUKOCYTESUR NEGATIVE 03/22/2019 1226    Radiological Exams on Admission: Ct Head Wo Contrast  Result Date: 03/22/2019 CLINICAL DATA:  Fall in bathroom striking back of head. EXAM: CT HEAD WITHOUT CONTRAST CT CERVICAL SPINE WITHOUT CONTRAST TECHNIQUE: Multidetector CT imaging of the head and cervical spine was performed following the standard protocol without intravenous contrast. Multiplanar CT image reconstructions of the cervical spine were also generated. COMPARISON:  None. FINDINGS: CT  HEAD FINDINGS Brain: Ventricles and cisterns are within normal. There is mild age related atrophy as well as chronic ischemic microvascular disease. There is no mass, mass effect, shift of midline structures or acute hemorrhage. Suggestion of an old small left posterior watershed infarct. No acute infarction. Vascular: No hyperdense vessel or unexpected calcification. Skull: Normal. Negative for fracture or focal lesion. Sinuses/Orbits: No acute finding. Other: None. CT CERVICAL SPINE FINDINGS Alignment: Normal. Skull base and vertebrae: Mild spondylosis of the cervical spine. Vertebral body heights are maintained. Atlantoaxial articulation is normal. Mild uncovertebral joint spurring. Mild left-sided neural from narrowing at the C5-6 level and right-sided neural foraminal narrowing at the C6-7 level. No acute fracture or subluxation. Soft tissues and spinal canal: No prevertebral fluid or swelling. No visible canal hematoma. Disc levels:  Mild disc space narrowing at the C5-6 and C6-7 levels. Upper chest: No acute findings. Mild calcified plaque over the aortic arch. Other: None. IMPRESSION: 1.  No acute brain injury. 2. Mild chronic ischemic microvascular disease and age related atrophy. 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 3.  No acute cervical spine injury. 4. Mild spondylosis of the cervical spine with disc disease at the C5-6 and C6-7 levels and mild neural foraminal narrowing as described. 5.  Aortic Atherosclerosis (ICD10-I70.0). Electronically Signed   By: Marin Olp M.D.   On: 03/22/2019 15:41   Ct Cervical Spine Wo Contrast  Result Date: 03/22/2019 CLINICAL DATA:  Fall in bathroom striking back of head. EXAM: CT HEAD WITHOUT CONTRAST CT CERVICAL SPINE WITHOUT CONTRAST TECHNIQUE: Multidetector CT imaging of the head and cervical spine was performed following the standard protocol without intravenous contrast. Multiplanar CT image reconstructions of the cervical spine were also generated.  COMPARISON:  None. FINDINGS: CT HEAD FINDINGS Brain: Ventricles and cisterns are within normal. There is mild age related atrophy as well as chronic ischemic microvascular disease. There is no mass, mass effect, shift of midline structures or acute hemorrhage. Suggestion of an old small left posterior watershed infarct. No acute infarction. Vascular: No hyperdense vessel or unexpected calcification. Skull: Normal. Negative for fracture or focal lesion. Sinuses/Orbits: No acute finding. Other: None. CT CERVICAL SPINE  FINDINGS Alignment: Normal. Skull base and vertebrae: Mild spondylosis of the cervical spine. Vertebral body heights are maintained. Atlantoaxial articulation is normal. Mild uncovertebral joint spurring. Mild left-sided neural from narrowing at the C5-6 level and right-sided neural foraminal narrowing at the C6-7 level. No acute fracture or subluxation. Soft tissues and spinal canal: No prevertebral fluid or swelling. No visible canal hematoma. Disc levels:  Mild disc space narrowing at the C5-6 and C6-7 levels. Upper chest: No acute findings. Mild calcified plaque over the aortic arch. Other: None. IMPRESSION: 1.  No acute brain injury. 2. Mild chronic ischemic microvascular disease and age related atrophy. 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 3.  No acute cervical spine injury. 4. Mild spondylosis of the cervical spine with disc disease at the C5-6 and C6-7 levels and mild neural foraminal narrowing as described. 5.  Aortic Atherosclerosis (ICD10-I70.0). Electronically Signed   By: Marin Olp M.D.   On: 03/22/2019 15:41   Ct Abdomen Pelvis W Contrast  Result Date: 03/22/2019 CLINICAL DATA:  85 year old female with history of trauma from a fall in the bathroom. Abdominal pain. Blood in stool. EXAM: CT ABDOMEN AND PELVIS WITH CONTRAST TECHNIQUE: Multidetector CT imaging of the abdomen and pelvis was performed using the standard protocol following bolus administration of intravenous  contrast. CONTRAST:  50mL OMNIPAQUE IOHEXOL 300 MG/ML  SOLN COMPARISON:  None. FINDINGS: Lower chest: Aortic atherosclerosis. Mild cardiomegaly. Pacemaker lead extending in the right ventricle. Status post median sternotomy. Hepatobiliary: No suspicious cystic or solid hepatic lesions. Very mild intrahepatic biliary ductal dilatation, particularly and segments 2 and 3 of the liver. Common bile duct is dilated measuring up to 12 mm in the porta hepatis. No definite calcified stone in the common bile duct. Gallbladder is moderately distended. No definite calcified gallstones. No gallbladder wall thickening or pericholecystic fluid or inflammatory changes. Pancreas: No pancreatic mass. No pancreatic ductal dilatation. No pancreatic or peripancreatic fluid collections or inflammatory changes. Spleen: Unremarkable. Adrenals/Urinary Tract: 1.3 cm low-attenuation lesion in the upper pole the right kidney, compatible with a simple cyst. Subcentimeter low-attenuation lesion in the anterior aspect of the interpolar region of the left kidney, too small to characterize, but statistically likely to represent a cyst. No hydroureteronephrosis. Urinary bladder is normal in appearance. Bilateral adrenal glands are normal in appearance. Stomach/Bowel: Normal appearance of the stomach. No pathologic dilatation of small bowel or colon. The appendix is not confidently identified and may be surgically absent. Regardless, there are no inflammatory changes noted adjacent to the cecum to suggest the presence of an acute appendicitis at this time. Vascular/Lymphatic: Aortic atherosclerosis, without evidence of aneurysm or dissection in the abdominal or pelvic vasculature. No lymphadenopathy noted in the abdomen or pelvis. Reproductive: Status post hysterectomy.  Ovaries are a trophic. Other: No significant volume of ascites.  No pneumoperitoneum. Musculoskeletal: Chronic appearing compression fracture of superior endplate of L1 with [id number] loss  of anterior vertebral body height. There are no aggressive appearing lytic or blastic lesions noted in the visualized portions of the skeleton. IMPRESSION: 1. No evidence of significant acute traumatic injury to the abdomen or pelvis. 2. Mild intra and extrahepatic biliary ductal dilatation. No choledocholithiasis. No definite pancreatic head mass. These findings could simply be age related, however, if there is any biochemical evidence of biliary tract obstruction, further evaluation with abdominal MRI with and without IV gadolinium with MRCP should be considered to exclude the possibility of small ampullary neoplasm. 3. Cardiomegaly. 4. Aortic atherosclerosis. Electronically Signed   By: Quillian Quince  Entrikin M.D.   On: 03/22/2019 15:41   Dg Chest Portable 1 View  Result Date: 03/22/2019 CLINICAL DATA:  Pain after fall. EXAM: PORTABLE CHEST 1 VIEW COMPARISON:  None. FINDINGS: The heart size and mediastinal contours are within normal limits. Both lungs are clear. No pneumothorax or pleural effusion is noted. Sternotomy wires are noted. Left-sided pacemaker is unchanged in position. The visualized skeletal structures are unremarkable. IMPRESSION: No active disease. Electronically Signed   By: Marijo Conception M.D.   On: 03/22/2019 12:46   Dg Humerus Left  Result Date: 03/22/2019 CLINICAL DATA:  Tripped on file, history of atrial fibrillation. EXAM: LEFT HUMERUS - 2+ VIEW COMPARISON:  None FINDINGS: There is no evidence of fracture or other focal bone lesions. Soft tissues are unremarkable. IMPRESSION: Negative. Electronically Signed   By: Zetta Bills M.D.   On: 03/22/2019 12:47    EKG: Independently reviewed.  Atrial fibrillation, rate 130, QTc 456, with diffuse mild ST depression, with t wave abnormalities.  Assessment/Plan Active Problems:   Supratherapeutic INR   Atrial fibrillation (HCC)   Acute anemia   CAD (coronary artery disease)   Cardiac pacemaker in situ    Acute anemia-hemoglobin 8.3  down from ~13, 8 months ago.  Reported blood in stools, positive stool FOBT in ED.  Denies melena.  On anticoagulation with warfarin. No active bleed at this time in the ED. Blood pressure stable with tachycardia. -Monitor Hgb closely, CBC every 6 hourly x2 -GI consulted, will see in a.m. -IV Protonix 40 BID for now -Goal hemoglobin >  8 -Clear liquid diet, n.p.o. midnight - 572ml bolus N/s given in ED, continue 75cc/hr x 15hrs. - addendum- hgb now 6.5, will transfuse 1 Unit PRBC.   Atrial fibrillation with RVR  With supratherapeutic INR- heart rates up to 140s on my evaluation, improvement with 5 mg IV metoprolol.  RVR likely driven by anemia. Blood pressure systolic mostly 0000000 to [id number].  INR supratherapeutic > 10.  Last INR check 9/17 was 3.5. -IV metoprolol PRN, cautious rate control in the setting of possible GI bleed. -Resume home metoprolol at increased dose 100 mg every 12 hourly -Hold home benazepril to allow for adjustment of rate limiting medications. -Hold home warfarin -2.5mg  IV vitamin K given in ED, will add 2.5mg  IV for a total of 5mg .  -Check troponin - Magnesium- 1.8, TSH- 1.4. - Daily INR x 3 - Addendum- Rates still uncontrolled, up to 150s, despite IV Metoprolol 5mg  and resuming home metoprolol, will start cardizem gtt with bolus 10mg  x 1. Transfer to step down.  Multiple falls-with reported lightheadedness. At this time likely secondary to acute anemia versus A. fib with RVR.  Head and cervical CT negative for acute abnormality. -PT evaluation  Abdominal pain with mild elevated liver enzymes-upper abdominal pain.  AST 78, ALT 55, ALP 169, total bilirubin 2.  CT abdomen and pelvis with contrast-mild intra-and extrahepatic biliary ductal dilatation.  No definite pancreatic head mass.  MRI will with contrast with MRCP should be considered to exclude possibility of small ampullary Neoplasm. -GI consulted in ED, Dr. Gala Romney will see in a.m., okay to admit here for now, but may  need transfer for MRCP. -Check lipase -Hydrate -CMP a.m.  Leukocytosis-WBC 14.  She reports cough and chronic dysuria.  UA and portable chest x-ray not suggestive of infection.  ? stress reaction. -Monitor  Pacemaker status-interrogated in the ED-Per report patient had a run of atrial fibrillation this morning.  No evidence of lethal arrhythmias.  Ischemic cardiomyopathy-history of three-vessel CABG 1970.  She denies chest pain.  But EKG shows diffuse mild ST and t wave abnormalities. Likely 2/2  acute anemia, and A. fib with RVR.  Last echo 02/2018 shows EF 45 to 50%. -Will check troponin -Repeat EKG in a.m -Resume home atorvastatin -Hold benazepril for now to allow for adjustment of rate limiting drugs and in the setting of likely GI bleed.   DVT prophylaxis: SCDs, supratherapeutic INR Code Status: Full code Family Communication:  Disposition Plan: Per rounding team Consults called: Gastroenterology Admission status: Inpatient, stepdown I certify that at the point of admission it is my clinical judgment that the patient will require inpatient hospital care spanning beyond 2 midnights from the point of admission due to high intensity of service, high risk for further deterioration and high frequency of surveillance required. The following factors support the patient status of inpatient: Multiple problems including acute anemia with supratherapeutic INR and atrial fibrillation with RVR multiple falls requiring close monitoring, IV cardizem gtt and transfusions.   Bethena Roys MD [facility]  03/22/2019, 10:00 PM

## 2019-03-22 NOTE — ED Provider Notes (Signed)
[facility] EMERGENCY DEPARTMENT Provider Note   CSN: [id number] Arrival date & time: 03/22/19  1102     History   Chief Complaint Chief Complaint  Patient presents with  . Fall  . Abdominal Pain    HPI Angelica Hernandez is a 85 y.o. female.     Patient is an 85 year old female who presents to the emergency department by EMS following a fall.  The primary historian is the patient's daughter.  The daughter states that the patient has been having some increase in falls over the last week.  Today the patient had a fall and hit the back of her head.  There was no loss of consciousness, but complained of pain.  There was also injury to the left arm with some bruising.  EMS was called.  It was initially believed that the patient's fall may have been related to the patient's straining during bowel movement.  It is of note that the patient is in atrial fibrillation patient, but the patient did not complain of chest pain or palpitation prior to this event.  She does complain of some abdominal pain.  Right upper quadrant and epigastric area mostly.  The patient was noted to have some blood in the stool on yesterday October 7.  The patient is on Coumadin.  Patient presents now for evaluation of these multiple issues.  The history is provided by a relative and the patient.  Fall Associated symptoms include abdominal pain. Pertinent negatives include no chest pain and no shortness of breath.  Abdominal Pain Associated symptoms: no chest pain, no constipation, no cough, no diarrhea, no dysuria, no hematuria, no nausea, no shortness of breath and no vomiting     Past Medical History:  Diagnosis Date  . Anxiety   . CAD (coronary artery disease)   . CKD (chronic kidney disease)   . Gastro-esophageal reflux disease without esophagitis   . Hypothyroidism   . Ischemic cardiomyopathy   . Mild dilation of ascending aorta (HCC)   . Moderate protein-calorie malnutrition ([place])   . Permanent atrial  fibrillation ([place])   . Severe episode of recurrent major depressive disorder, without psychotic features ([place])   . Symptomatic bradycardia     Patient Active Problem List   Diagnosis Date Noted  . Atrial fibrillation ([place]) 02/14/2018  . Encounter for therapeutic drug monitoring 02/14/2018  . Cardiac pacemaker in situ 01/19/2018    Past Surgical History:  Procedure Laterality Date  . CESAREAN SECTION    . CORONARY ARTERY BYPASS GRAFT    . OTHER SURGICAL HISTORY     cesearan section   . PACEMAKER GENERATOR CHANGE  06/15/2017   [employer] Accolade SR L 300 ([place] [id number]) implanted by Dr Ernestina Patches at [facility] in [place] for symptomatic bradycardia  . PACEMAKER IMPLANT  11/16/2006   PPM implanted for bradycardia     OB History   No obstetric history on file.      Home Medications    Prior to Admission medications   Medication Sig Start Date End Date Taking? Authorizing Provider  atorvastatin (LIPITOR) 10 MG tablet Take 10 mg by mouth daily.    [provider]  benazepril (LOTENSIN) 5 MG tablet Take 2.5 mg by mouth daily. 02/20/19   [provider]  Cholecalciferol (VITAMIN D3) 1000 units CAPS Take 1,000 Units by mouth daily.    [provider]  DULoxetine (CYMBALTA) 30 MG capsule Take 1 capsule by mouth daily. 03/01/19   [provider]  FERREX 150 150 MG capsule Take 150 mg by mouth daily.  02/07/18   [provider]  gabapentin (NEURONTIN) 100 MG capsule Take 100 mg by mouth at bedtime.    [provider]  levothyroxine (SYNTHROID, LEVOTHROID) 50 MCG tablet Take 50 mcg by mouth daily before breakfast.    [provider]  metoprolol succinate (TOPROL-XL) 50 MG 24 hr tablet Take 50 mg by mouth daily. Take with or immediately following a meal.    [provider]  nitroGLYCERIN (NITROSTAT) 0.4 MG SL tablet Place 1 tablet (0.4 mg total) under the tongue every 5 (five) minutes as  needed for chest pain. 09/18/18   Herminio Commons, MD  vitamin C (ASCORBIC ACID) 500 MG tablet Take 500 mg by mouth 2 (two) times daily.     [provider]  warfarin (COUMADIN) 5 MG tablet Take 5 mg by mouth daily. MANAGED BY PMD    [provider]    Family History Family History  Problem Relation Age of Onset  . Heart disease Mother   . Hypertension Mother   . Diabetes Mother   . Hypertension Father   . Heart disease Father   . Hypertension Brother     Social History Social History   Tobacco Use  . Smoking status: Current Every Day Smoker    Packs/day: 0.50    Types: Cigarettes  . Smokeless tobacco: Never Used  Substance Use Topics  . Alcohol use: Never    Frequency: Never  . Drug use: Never     Allergies   Patient has no known allergies.   Review of Systems Review of Systems  Constitutional: Negative for activity change and appetite change.  HENT: Negative for congestion, ear discharge, ear pain, facial swelling, nosebleeds, rhinorrhea, sneezing and tinnitus.   Eyes: Negative for photophobia, pain and discharge.  Respiratory: Negative for cough, choking, shortness of breath and wheezing.   Cardiovascular: Negative for chest pain, palpitations and leg swelling.  Gastrointestinal: Positive for abdominal pain and blood in stool. Negative for constipation, diarrhea, nausea and vomiting.  Genitourinary: Negative for difficulty urinating, dysuria, flank pain, frequency and hematuria.  Musculoskeletal: Positive for arthralgias. Negative for back pain, gait problem, myalgias and neck pain.  Skin: Negative for color change, rash and wound.  Neurological: Negative for dizziness, seizures, syncope, facial asymmetry, speech difficulty, weakness and numbness.  Hematological: Negative for adenopathy. Does not bruise/bleed easily.  Psychiatric/Behavioral: Negative for agitation, confusion, hallucinations, self-injury and suicidal ideas. The patient is not  nervous/anxious.      Physical Exam Updated Vital Signs BP (!) 116/59   Pulse (!) 59   Temp 97.7 F (36.5 C) (Oral)   Resp (!) 36   Ht 5\' 6"  (1.676 m)   Wt 49.9 kg   SpO2 100%   BMI 17.75 kg/m   Physical Exam Vitals signs and nursing note reviewed.  Constitutional:      Appearance: She is well-developed. She is not toxic-appearing.  HENT:     Head: Normocephalic.      Right Ear: Tympanic membrane and external ear normal.     Left Ear: Tympanic membrane and external ear normal.  Eyes:     General: Lids are normal.     Pupils: Pupils are equal, round, and reactive to light.  Neck:     Musculoskeletal: Normal range of motion and neck supple. Muscular tenderness present.     Vascular: No carotid bruit.   Cardiovascular:     Rate  and Rhythm: Tachycardia present. Rhythm irregularly irregular.     Pulses: Normal pulses.     Heart sounds: Normal heart sounds. No friction rub.  Pulmonary:     Effort: No respiratory distress.     Breath sounds: Normal breath sounds.  Abdominal:     General: Bowel sounds are normal.     Palpations: Abdomen is soft.     Tenderness: There is abdominal tenderness in the right upper quadrant and epigastric area. There is no guarding.     Comments: There is tenderness in the epigastric and right upper quadrant of the abdomen most intense.  There is some generalized tenderness noted.  Musculoskeletal: Normal range of motion.     Comments: Tenderness with some mild bruising of the left humeral area.  Good range of motion of the left fingers, wrist, and elbow.  Lymphadenopathy:     Head:     Right side of head: No submandibular adenopathy.     Left side of head: No submandibular adenopathy.     Cervical: No cervical adenopathy.  Skin:    General: Skin is warm and dry.  Neurological:     Mental Status: She is alert and oriented to person, place, and time.     Cranial Nerves: No cranial nerve deficit.     Sensory: No sensory deficit.   Psychiatric:        Speech: Speech normal.      ED Treatments / Results  Labs (all labs ordered are listed, but only abnormal results are displayed) Labs Reviewed  COMPREHENSIVE METABOLIC PANEL - Abnormal; Notable for the following components:      Result Value   CO2 21 (*)    Glucose, Bld 127 (*)    Calcium 8.5 (*)    Total Protein 6.0 (*)    Albumin 3.4 (*)    AST 78 (*)    ALT 55 (*)    Alkaline Phosphatase 169 (*)    Total Bilirubin 2.0 (*)    All other components within normal limits  CBC WITH DIFFERENTIAL/PLATELET - Abnormal; Notable for the following components:   WBC 14.5 (*)    RBC 2.73 (*)    Hemoglobin 8.3 (*)    HCT 26.4 (*)    Neutro Abs 11.1 (*)    Monocytes Absolute 1.1 (*)    Eosinophils Absolute 0.6 (*)    All other components within normal limits  AMMONIA  URINALYSIS, ROUTINE W REFLEX MICROSCOPIC  PROTIME-INR  POC OCCULT BLOOD, ED    EKG EKG Interpretation  Date/Time:  Thursday March 22 2019 11:12:54 EDT Ventricular Rate:  130 PR Interval:    QRS Duration: 91 QT Interval:  310 QTC Calculation: 456 R Axis:   77 Text Interpretation:  Atrial fibrillation RSR' in V1 or V2, right VCD or RVH Repolarization abnormality, prob rate related No STEMI. No old tracing for comparison.  Confirmed by Nanda Quinton [phone]) on 03/22/2019 11:20:11 AM   Radiology Dg Chest Portable 1 View  Result Date: 03/22/2019 CLINICAL DATA:  Pain after fall. EXAM: PORTABLE CHEST 1 VIEW COMPARISON:  None. FINDINGS: The heart size and mediastinal contours are within normal limits. Both lungs are clear. No pneumothorax or pleural effusion is noted. Sternotomy wires are noted. Left-sided pacemaker is unchanged in position. The visualized skeletal structures are unremarkable. IMPRESSION: No active disease. Electronically Signed   By: Marijo Conception M.D.   On: 03/22/2019 12:46   Dg Humerus Left  Result Date: 03/22/2019 CLINICAL DATA:  Tripped  on file, history of atrial  fibrillation. EXAM: LEFT HUMERUS - 2+ VIEW COMPARISON:  None FINDINGS: There is no evidence of fracture or other focal bone lesions. Soft tissues are unremarkable. IMPRESSION: Negative. Electronically Signed   By: Zetta Bills M.D.   On: 03/22/2019 12:47    Procedures Procedures (including critical care time) CRITICAL CARE Performed by: Lily Kocher Total critical care time: **45* minutes Critical care time was exclusive of separately billable procedures and treating other patients. Critical care was necessary to treat or prevent imminent or life-threatening deterioration. Critical care was time spent personally by me on the following activities: development of treatment plan with patient and/or surrogate as well as nursing, discussions with consultants, evaluation of patient's response to treatment, examination of patient, obtaining history from patient or surrogate, ordering and performing treatments and interventions, ordering and review of laboratory studies, ordering and review of radiographic studies, pulse oximetry and re-evaluation of patient's condition.  Medications Ordered in ED Medications  iohexol (OMNIPAQUE) 300 MG/ML solution 75 mL (has no administration in time range)  metoprolol tartrate (LOPRESSOR) injection 5 mg (5 mg Intravenous Given 03/22/19 1241)     Initial Impression / Assessment and Plan / ED Course  I have reviewed the triage vital signs and the nursing notes.  Pertinent labs & imaging results that were available during my care of the patient were reviewed by me and considered in my medical decision making (see chart for details).          Final Clinical Impressions(s) / ED Diagnoses MDM  Patient presents to the emergency department with her daughter following a fall.  There was also a complaint of abdominal pain.  The daughter states that the fall was most likely a trip and fall, no reported loss of consciousness.  Review of the electrocardiogram and  the monitor show atrial fibrillation with rapid ventricular response of 130 bpm.  IV Lopressor given.  The comprehensive metabolic panel shows the CO2 to be slightly low at 21, glucose elevated at 127, the alkaline phosphatase is elevated at 169, the ALT is elevated at 55, and the AST is elevated at 78.  The complete blood count shows the white blood cells to be elevated at 15,500.  The hemoglobin is low at 8.3, and the hematocrit is low at 26.4.  There is no shift to the left.  It is of note that the hemoglobin is down 4 g from patient's previous testing in January.  Will obtain stool for occult blood.  Heart rate much improved after IV Lopressor.  Patient maintaining adequate blood pressure.  Recheck.  Patient is awake and alert.  Maintaining adequate blood pressure.  Pulse under good control following Lopressor.  Stool for occult blood is found to be positive.  Received critical value of PT greater than 90, INR greater than 10.  Patient seen by Dr. Laverta Baltimore.  IV vitamin K started.  CT head scan is negative for acute intracranial abnormality.  There is no evidence of any skull fracture.  CT cervical spine film is negative for fracture or dislocation.  X-ray of the left humerus is negative for fracture or dislocation.  X-ray of the chest shows the lungs to be clear.  No abnormality of the left-sided pacemaker position.  Findings up to this point discussed with the patient's daughter in terms of which he understands.  CT scan of the abdomen shows no evidence of traumatic injury.  There is noted intrahepatic and extrahepatic biliary ductal dilatation.  No choledocholithiasis  appreciated.  No definite mass at the head of the pancreas.  The patient's hepatic function studies are elevated.  This along with the biliary ductal dilatation raises question of possible biliary tract obstruction.  MRI with and without IV gadolinium with MRCP has been suggested.  Case discussed with Dr. Gala Romney and triad  hospitalist.  Patient to be admitted.   Final diagnoses:  Hypercoagulopathy [facility])  Gastrointestinal hemorrhage, unspecified gastrointestinal hemorrhage type  Atrial fibrillation with rapid ventricular response [facility])    ED Discharge Orders    None       Lily Kocher, PA-C 03/22/19 1658    Long, [place], MD 03/23/19 [phone]

## 2019-03-22 NOTE — ED Notes (Signed)
Date and time results received: 03/22/19 2050 (use smartphrase ".now" to insert current time)  Test: Hgb Critical Value: 6.5  Name of Provider Notified: Emokpae  Orders Received? Or Actions Taken?:

## 2019-03-23 ENCOUNTER — Inpatient Hospital Stay (HOSPITAL_COMMUNITY): Payer: Medicare Other

## 2019-03-23 DIAGNOSIS — Z950 Presence of cardiac pacemaker: Secondary | ICD-10-CM

## 2019-03-23 DIAGNOSIS — K838 Other specified diseases of biliary tract: Secondary | ICD-10-CM

## 2019-03-23 DIAGNOSIS — D6859 Other primary thrombophilia: Secondary | ICD-10-CM

## 2019-03-23 DIAGNOSIS — K922 Gastrointestinal hemorrhage, unspecified: Secondary | ICD-10-CM

## 2019-03-23 DIAGNOSIS — R945 Abnormal results of liver function studies: Secondary | ICD-10-CM

## 2019-03-23 DIAGNOSIS — R7989 Other specified abnormal findings of blood chemistry: Secondary | ICD-10-CM

## 2019-03-23 DIAGNOSIS — I4891 Unspecified atrial fibrillation: Secondary | ICD-10-CM

## 2019-03-23 DIAGNOSIS — R791 Abnormal coagulation profile: Secondary | ICD-10-CM

## 2019-03-23 DIAGNOSIS — D649 Anemia, unspecified: Secondary | ICD-10-CM

## 2019-03-23 LAB — CBC
HCT: 17.1 % — ABNORMAL LOW (ref 36.0–46.0)
HCT: 23.4 % — ABNORMAL LOW (ref 36.0–46.0)
Hemoglobin: 5.7 g/dL — CL (ref 12.0–15.0)
Hemoglobin: 7.5 g/dL — ABNORMAL LOW (ref 12.0–15.0)
MCH: 29.8 pg (ref 26.0–34.0)
MCH: 30 pg (ref 26.0–34.0)
MCHC: 32.1 g/dL (ref 30.0–36.0)
MCHC: 33.3 g/dL (ref 30.0–36.0)
MCV: 90 fL (ref 80.0–100.0)
MCV: 92.9 fL (ref 80.0–100.0)
Platelets: 155 10*3/uL (ref 150–400)
Platelets: 183 10*3/uL (ref 150–400)
RBC: 1.9 MIL/uL — ABNORMAL LOW (ref 3.87–5.11)
RBC: 2.52 MIL/uL — ABNORMAL LOW (ref 3.87–5.11)
RDW: 14.4 % (ref 11.5–15.5)
RDW: 14.5 % (ref 11.5–15.5)
WBC: 10.8 10*3/uL — ABNORMAL HIGH (ref 4.0–10.5)
WBC: 10.9 10*3/uL — ABNORMAL HIGH (ref 4.0–10.5)
nRBC: 0 % (ref 0.0–0.2)
nRBC: 0 % (ref 0.0–0.2)

## 2019-03-23 LAB — COMPREHENSIVE METABOLIC PANEL
ALT: 38 U/L (ref 0–44)
AST: 53 U/L — ABNORMAL HIGH (ref 15–41)
Albumin: 3.1 g/dL — ABNORMAL LOW (ref 3.5–5.0)
Alkaline Phosphatase: 111 U/L (ref 38–126)
Anion gap: 8 (ref 5–15)
BUN: 23 mg/dL (ref 8–23)
CO2: 20 mmol/L — ABNORMAL LOW (ref 22–32)
Calcium: 7.8 mg/dL — ABNORMAL LOW (ref 8.9–10.3)
Chloride: 111 mmol/L (ref 98–111)
Creatinine, Ser: 0.67 mg/dL (ref 0.44–1.00)
GFR calc Af Amer: >60 mL/min (ref >60)
GFR calc non Af Amer: >60 mL/min (ref >60)
Glucose, Bld: 110 mg/dL — ABNORMAL HIGH (ref 70–99)
Potassium: 3.6 mmol/L (ref 3.5–5.1)
Sodium: 139 mmol/L (ref 135–145)
Total Bilirubin: 1.7 mg/dL — ABNORMAL HIGH (ref 0.3–1.2)
Total Protein: 5.2 g/dL — ABNORMAL LOW (ref 6.5–8.1)

## 2019-03-23 LAB — SARS CORONAVIRUS 2 (TAT 6-24 HRS): SARS Coronavirus 2: NEGATIVE

## 2019-03-23 LAB — PROTIME-INR
INR: 1.4 — ABNORMAL HIGH (ref 0.8–1.2)
Prothrombin Time: 16.5 seconds — ABNORMAL HIGH (ref 11.4–15.2)

## 2019-03-23 LAB — MRSA PCR SCREENING: MRSA by PCR: NEGATIVE

## 2019-03-23 MED ORDER — QUETIAPINE FUMARATE 25 MG PO TABS: 12.5000 mg | ORAL_TABLET | Freq: Every evening | ORAL | Status: DC | PRN

## 2019-03-23 MED ORDER — DILTIAZEM HCL-DEXTROSE 125-5 MG/125ML-% IV SOLN (PREMIX): 5.0000 mg/h | INTRAVENOUS | Status: DC

## 2019-03-23 MED ORDER — GABAPENTIN 300 MG PO CAPS
300.0000 mg | ORAL_CAPSULE | Freq: Every day | ORAL | Status: DC
  Administered 2019-03-24 – 2019-03-26 (×2): 300 mg via ORAL
  Filled 2019-03-23 (×2): qty 1

## 2019-03-23 MED ORDER — GABAPENTIN 100 MG PO CAPS
200.0000 mg | ORAL_CAPSULE | Freq: Once | ORAL | Status: AC
  Administered 2019-03-23: 200 mg via ORAL
  Filled 2019-03-23: qty 2

## 2019-03-23 MED ORDER — QUETIAPINE FUMARATE 25 MG PO TABS
12.5000 mg | ORAL_TABLET | Freq: Once | ORAL | Status: DC
  Filled 2019-03-23: qty 1

## 2019-03-23 MED ORDER — LORAZEPAM 2 MG/ML IJ SOLN
0.5000 mg | Freq: Once | INTRAMUSCULAR | Status: AC
  Administered 2019-03-23: 0.5 mg via INTRAVENOUS
  Filled 2019-03-23: qty 1

## 2019-03-23 MED ORDER — BOOST / RESOURCE BREEZE PO LIQD CUSTOM
1.0000 | Freq: Three times a day (TID) | ORAL | Status: DC
  Administered 2019-03-23 – 2019-03-27 (×7): 1 via ORAL

## 2019-03-23 MED ORDER — IOHEXOL 300 MG/ML  SOLN
100.0000 mL | Freq: Once | INTRAMUSCULAR | Status: AC | PRN
  Administered 2019-03-23: 16:00:00 100 mL via INTRAVENOUS

## 2019-03-23 NOTE — Consult Note (Addendum)
Referring Provider: No ref. provider found Primary Care Physician:  Caryl Bis, MD Primary Gastroenterologist:  Dr. Oneida Alar (previously unassigned)  Date of Admission: 03/22/2019 Date of Consultation: 03/23/2019  Reason for Consultation:  GI bleed/hematochezia in the setting of supratheraputic INR; elevated LFTs/bile duct dilation  HPI:  Angelica Hernandez is a 85 y.o. 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  She presented by EMS with complaints of a fall.  Also noted abdominal pain in the epigastric/upper abdomen for 2 days.  Fall related to dizziness upon standing.  At the time of her fall she was straining to have a bowel movement.  Unfortunately she hit the back of her head.  She is on chronic anticoagulation with Coumadin.  In the ER she was noted to have mild elevation of AST/ALT, alkaline phosphatase, and total bilirubin.  Leukocytosis with a white blood cell count of 14.  No acute fractures or traumatic brain injury per imaging.  Abdominal CT with mild intra-and extrahepatic biliary ductal dilation, no definite pancreatic mass.  Her labs demonstrated a supratherapeutic INR greater than 10.  Hemoglobin was 8.3 which is a decline from 13 approximately 8 months ago.  She reported blood in her stools and had an iFOBT result to positive in the emergency department.  Denied melena.  She was having rapid ventricular response with her atrial fibrillation in the emergency room with a heart rate up to 140s that improved with IV beta-blocker; tachycardia felt to be influenced by anemia.  However she was started on a Cardizem drip and admitted to the ICU.  CMP yesterday found AST/ALT at 78/55, alkaline phosphatase at 169, total bilirubin at 2.0.  Essentially no historical labs to compare to baseline.  Ammonia, lipase normal.  Overnight  her hemoglobin declined to 6.5 and then to 5.7.  It appears she was transfused 1 unit of blood starting approximately 2 AM.  Today her vitals are stable.  Last blood pressure 107/82, last heart rate 80. MRI/MRCP not ordered yet.  Today she states she's doing well. Denies abdominal pain, N/V. Just started having hematochezia a couple days ago. Denies any melena. States she's never had a colonoscopy or EGD. When she fell she was having weakness, dizziness and "everything just went black." No further weakness now. Discussed having too much Coumadin in her system (and she is aware/understands this.) She is amendable to eventual colonoscopy, if needed. Denies jaundice, darkened urine. No other GI complaints.  Past Medical History:  Diagnosis Date  . Anxiety   . CAD (coronary artery disease)   . CKD (chronic kidney disease)   . Gastro-esophageal reflux disease without esophagitis   . Hypothyroidism   . Ischemic cardiomyopathy   . Mild dilation of ascending aorta (H[facility])   . Moderate protein-calorie malnutrition ([place])   . Permanent atrial fibrillation ([place])   . Severe episode of recurrent major depressive disorder, without psychotic features ([place])   . Symptomatic bradycardia     Past Surgical History:  Procedure Laterality Date  . CESAREAN SECTION    . CORONARY ARTERY BYPASS GRAFT    . OTHER SURGICAL HISTORY     cesearan section   . PACEMAKER GENERATOR CHANGE  06/15/2017   [employer] Accolade SR L 300 ([place] [id number]) implanted by Dr Ernestina Patches at [facility] in [place] for symptomatic bradycardia  . PACEMAKER IMPLANT  11/16/2006  PPM implanted for bradycardia    Prior to Admission medications   Medication Sig Start Date End Date Taking? Authorizing Provider  atorvastatin (LIPITOR) 10 MG tablet Take 10 mg by mouth daily.    [provider]  benazepril (LOTENSIN) 5 MG tablet Take 2.5 mg by mouth daily. 02/20/19   [provider]   Cholecalciferol (VITAMIN D3) 1000 units CAPS Take 1,000 Units by mouth daily.    [provider]  DULoxetine (CYMBALTA) 30 MG capsule Take 1 capsule by mouth daily. 03/01/19   [provider]  FERREX 150 150 MG capsule Take 150 mg by mouth daily.  02/07/18   [provider]  gabapentin (NEURONTIN) 100 MG capsule Take 100 mg by mouth at bedtime.    [provider]  gabapentin (NEURONTIN) 300 MG capsule Take 300 mg by mouth at bedtime. For pain/sleep 01/18/19   [provider]  levothyroxine (SYNTHROID, LEVOTHROID) 50 MCG tablet Take 50 mcg by mouth daily before breakfast.    [provider]  metoprolol succinate (TOPROL-XL) 50 MG 24 hr tablet Take 50 mg by mouth daily. Take with or immediately following a meal.    [provider]  nitroGLYCERIN (NITROSTAT) 0.4 MG SL tablet Place 1 tablet (0.4 mg total) under the tongue every 5 (five) minutes as needed for chest pain. 09/18/18   Herminio Commons, MD  vitamin C (ASCORBIC ACID) 500 MG tablet Take 500 mg by mouth 2 (two) times daily.     [provider]  warfarin (COUMADIN) 5 MG tablet Take 5 mg by mouth daily. MANAGED BY PMD    [provider]    Current Facility-Administered Medications  Medication Dose Route Frequency Provider Last Rate Last Dose  . 0.9 %  sodium chloride infusion   Intravenous Continuous Emokpae, Ejiroghene E, MD 75 mL/hr at 03/23/19 0600    . acetaminophen (TYLENOL) tablet 650 mg  650 mg Oral Q6H PRN Emokpae, Ejiroghene E, MD       Or  . acetaminophen (TYLENOL) suppository 650 mg  650 mg Rectal Q6H PRN Emokpae, Ejiroghene E, MD      . atorvastatin (LIPITOR) tablet 10 mg  10 mg Oral q1800 Emokpae, Ejiroghene E, MD      . Chlorhexidine Gluconate Cloth 2 % PADS 6 each  6 each Topical Daily Emokpae, Ejiroghene E, MD      . diltiazem (CARDIZEM) 125 mg in dextrose 5% 125 mL (1 mg/mL) infusion  5-15 mg/hr Intravenous Continuous Emokpae, Ejiroghene E, MD 5  mL/hr at 03/23/19 0600 5 mg/hr at 03/23/19 0600  . DULoxetine (CYMBALTA) DR capsule 30 mg  30 mg Oral Daily Emokpae, Ejiroghene E, MD      . levothyroxine (SYNTHROID) tablet 50 mcg  50 mcg Oral Q0600 Emokpae, Ejiroghene E, MD   50 mcg at 03/23/19 0634  . metoprolol tartrate (LOPRESSOR) tablet 100 mg  100 mg Oral BID Emokpae, Ejiroghene E, MD   100 mg at 03/22/19 2027  . ondansetron (ZOFRAN) tablet 4 mg  4 mg Oral Q6H PRN Emokpae, Ejiroghene E, MD       Or  . ondansetron (ZOFRAN) injection 4 mg  4 mg Intravenous Q6H PRN Emokpae, Ejiroghene E, MD      . pantoprazole (PROTONIX) injection 40 mg  40 mg Intravenous Q12H Emokpae, Ejiroghene E, MD      . polyethylene glycol (MIRALAX / GLYCOLAX) packet 17 g  17 g Oral Daily PRN Emokpae, Leanne Chang, MD  Allergies as of 03/22/2019  . (No Known Allergies)    Family History  Problem Relation Age of Onset  . Heart disease Mother   . Hypertension Mother   . Diabetes Mother   . Hypertension Father   . Heart disease Father   . Hypertension Brother     Social History   Socioeconomic History  . Marital status: Widowed    Spouse name: Not on file  . Number of children: Not on file  . Years of education: Not on file  . Highest education level: Not on file  Occupational History  . Not on file  Social Needs  . Financial resource strain: Not on file  . Food insecurity    Worry: Not on file    Inability: Not on file  . Transportation needs    Medical: Not on file    Non-medical: Not on file  Tobacco Use  . Smoking status: Current Every Day Smoker    Packs/day: 0.50    Types: Cigarettes  . Smokeless tobacco: Never Used  Substance and Sexual Activity  . Alcohol use: Never    Frequency: Never  . Drug use: Never  . Sexual activity: Not on file  Lifestyle  . Physical activity    Days per week: Not on file    Minutes per session: Not on file  . Stress: Not on file  Relationships  . Social Herbalist on phone: Not on  file    Gets together: Not on file    Attends religious service: Not on file    Active member of club or organization: Not on file    Attends meetings of clubs or organizations: Not on file    Relationship status: Not on file  . Intimate partner violence    Fear of current or ex partner: Not on file    Emotionally abused: Not on file    Physically abused: Not on file    Forced sexual activity: Not on file  Other Topics Concern  . Not on file  Social History Narrative   Lives with daughter in [place]: General: Negative for anorexia, weight loss, fever, chills, fatigue, weakness. ENT: Negative for hoarseness, difficulty swallowing CV: Negative for chest pain, angina, palpitations, peripheral edema.  Respiratory: Negative for dyspnea at rest, cough, sputum, wheezing.  GI: See history of present illness. MS: Negative for joint pain, low back pain.  Derm: Negative for rash or itching.  Endo: Negative for unusual weight change.  Heme: Negative for bruising or bleeding. Allergy: Negative for rash or hives.  Physical Exam: Vital signs in last 24 hours: Temp:  [97.7 F (36.5 C)-98.6 F (37 C)] 98.2 F (36.8 C) (10/09 0400) Pulse Rate:  [40-146] 80 (10/09 0728) Resp:  [12-36] 12 (10/09 0728) BP: (87-139)/(40-100) 107/82 (10/09 0728) SpO2:  [86 %-100 %] 99 % (10/09 0728) Weight:  [49.9 kg-54.5 kg] 54.5 kg (10/09 0500) Last BM Date: 03/22/19 General:   Alert,  Well-developed, well-nourished, pleasant and cooperative in NAD Head:  Normocephalic and atraumatic. Eyes:  Sclera clear, no icterus. Conjunctiva pink. Ears:  Normal auditory acuity. Nose:  No deformity, discharge,  or lesions. Lungs:  Clear throughout to auscultation.   No wheezes, crackles, or rhonchi. No acute distress. Heart:  Regular rate and rhythm; no murmurs, clicks, rubs, or gallops. PPM LUC noted. Abdomen:  Soft, nontender and nondistended. No masses, hepatosplenomegaly or hernias noted. Normal  bowel sounds, without guarding, and without  rebound.   Rectal:  Deferred.   Msk:  Symmetrical without gross deformities. Extremities:  Without clubbing or edema. Neurologic:  Alert and  oriented x4;  grossly normal neurologically. Psych:  Alert and cooperative. Normal mood and affect.  Intake/Output from previous day: 10/08 0701 - 10/09 0700 In: 1691.2 [I.V.:978.5; Blood:630; IV Piggyback:82.7] Out: -  Intake/Output this shift: Total I/O In: -  Out: 350 [Urine:350]  Lab Results: Recent Labs    03/22/19 1248 03/22/19 2013 03/23/19 0017  WBC 14.5*  --  10.8*  HGB 8.3* 6.5* 5.7*  HCT 26.4* 19.0* 17.1*  PLT 224  --  155   BMET Recent Labs    03/22/19 1248  NA 141  K 3.9  CL 108  CO2 21*  GLUCOSE 127*  BUN 23  CREATININE 0.61  CALCIUM 8.5*   LFT Recent Labs    03/22/19 1248  PROT 6.0*  ALBUMIN 3.4*  AST 78*  ALT 55*  ALKPHOS 169*  BILITOT 2.0*   PT/INR Recent Labs    03/22/19 1440  LABPROT >90.0*  INR >10.0*   Hepatitis Panel No results for input(s): HEPBSAG, HCVAB, HEPAIGM, HEPBIGM in the last 72 hours. C-Diff No results for input(s): CDIFFTOX in the last 72 hours.  Studies/Results: Ct Head Wo Contrast  Result Date: 03/22/2019 CLINICAL DATA:  Fall in bathroom striking back of head. EXAM: CT HEAD WITHOUT CONTRAST CT CERVICAL SPINE WITHOUT CONTRAST TECHNIQUE: Multidetector CT imaging of the head and cervical spine was performed following the standard protocol without intravenous contrast. Multiplanar CT image reconstructions of the cervical spine were also generated. COMPARISON:  None. FINDINGS: CT HEAD FINDINGS Brain: Ventricles and cisterns are within normal. There is mild age related atrophy as well as chronic ischemic microvascular disease. There is no mass, mass effect, shift of midline structures or acute hemorrhage. Suggestion of an old small left posterior watershed infarct. No acute infarction. Vascular: No hyperdense vessel or unexpected  calcification. Skull: Normal. Negative for fracture or focal lesion. Sinuses/Orbits: No acute finding. Other: None. CT CERVICAL SPINE FINDINGS Alignment: Normal. Skull base and vertebrae: Mild spondylosis of the cervical spine. Vertebral body heights are maintained. Atlantoaxial articulation is normal. Mild uncovertebral joint spurring. Mild left-sided neural from narrowing at the C5-6 level and right-sided neural foraminal narrowing at the C6-7 level. No acute fracture or subluxation. Soft tissues and spinal canal: No prevertebral fluid or swelling. No visible canal hematoma. Disc levels:  Mild disc space narrowing at the C5-6 and C6-7 levels. Upper chest: No acute findings. Mild calcified plaque over the aortic arch. Other: None. IMPRESSION: 1.  No acute brain injury. 2. Mild chronic ischemic microvascular disease and age related atrophy. 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 3.  No acute cervical spine injury. 4. Mild spondylosis of the cervical spine with disc disease at the C5-6 and C6-7 levels and mild neural foraminal narrowing as described. 5.  Aortic Atherosclerosis (ICD10-I70.0). Electronically Signed   By: Marin Olp M.D.   On: 03/22/2019 15:41   Ct Cervical Spine Wo Contrast  Result Date: 03/22/2019 CLINICAL DATA:  Fall in bathroom striking back of head. EXAM: CT HEAD WITHOUT CONTRAST CT CERVICAL SPINE WITHOUT CONTRAST TECHNIQUE: Multidetector CT imaging of the head and cervical spine was performed following the standard protocol without intravenous contrast. Multiplanar CT image reconstructions of the cervical spine were also generated. COMPARISON:  None. FINDINGS: CT HEAD FINDINGS Brain: Ventricles and cisterns are within normal. There is mild age related atrophy as well as chronic ischemic microvascular  disease. There is no mass, mass effect, shift of midline structures or acute hemorrhage. Suggestion of an old small left posterior watershed infarct. No acute infarction. Vascular: No  hyperdense vessel or unexpected calcification. Skull: Normal. Negative for fracture or focal lesion. Sinuses/Orbits: No acute finding. Other: None. CT CERVICAL SPINE FINDINGS Alignment: Normal. Skull base and vertebrae: Mild spondylosis of the cervical spine. Vertebral body heights are maintained. Atlantoaxial articulation is normal. Mild uncovertebral joint spurring. Mild left-sided neural from narrowing at the C5-6 level and right-sided neural foraminal narrowing at the C6-7 level. No acute fracture or subluxation. Soft tissues and spinal canal: No prevertebral fluid or swelling. No visible canal hematoma. Disc levels:  Mild disc space narrowing at the C5-6 and C6-7 levels. Upper chest: No acute findings. Mild calcified plaque over the aortic arch. Other: None. IMPRESSION: 1.  No acute brain injury. 2. Mild chronic ischemic microvascular disease and age related atrophy. 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 3.  No acute cervical spine injury. 4. Mild spondylosis of the cervical spine with disc disease at the C5-6 and C6-7 levels and mild neural foraminal narrowing as described. 5.  Aortic Atherosclerosis (ICD10-I70.0). Electronically Signed   By: Marin Olp M.D.   On: 03/22/2019 15:41   Ct Abdomen Pelvis W Contrast  Result Date: 03/22/2019 CLINICAL DATA:  85 year old female with history of trauma from a fall in the bathroom. Abdominal pain. Blood in stool. EXAM: CT ABDOMEN AND PELVIS WITH CONTRAST TECHNIQUE: Multidetector CT imaging of the abdomen and pelvis was performed using the standard protocol following bolus administration of intravenous contrast. CONTRAST:  37mL OMNIPAQUE IOHEXOL 300 MG/ML  SOLN COMPARISON:  None. FINDINGS: Lower chest: Aortic atherosclerosis. Mild cardiomegaly. Pacemaker lead extending in the right ventricle. Status post median sternotomy. Hepatobiliary: No suspicious cystic or solid hepatic lesions. Very mild intrahepatic biliary ductal dilatation, particularly and segments  2 and 3 of the liver. Common bile duct is dilated measuring up to 12 mm in the porta hepatis. No definite calcified stone in the common bile duct. Gallbladder is moderately distended. No definite calcified gallstones. No gallbladder wall thickening or pericholecystic fluid or inflammatory changes. Pancreas: No pancreatic mass. No pancreatic ductal dilatation. No pancreatic or peripancreatic fluid collections or inflammatory changes. Spleen: Unremarkable. Adrenals/Urinary Tract: 1.3 cm low-attenuation lesion in the upper pole the right kidney, compatible with a simple cyst. Subcentimeter low-attenuation lesion in the anterior aspect of the interpolar region of the left kidney, too small to characterize, but statistically likely to represent a cyst. No hydroureteronephrosis. Urinary bladder is normal in appearance. Bilateral adrenal glands are normal in appearance. Stomach/Bowel: Normal appearance of the stomach. No pathologic dilatation of small bowel or colon. The appendix is not confidently identified and may be surgically absent. Regardless, there are no inflammatory changes noted adjacent to the cecum to suggest the presence of an acute appendicitis at this time. Vascular/Lymphatic: Aortic atherosclerosis, without evidence of aneurysm or dissection in the abdominal or pelvic vasculature. No lymphadenopathy noted in the abdomen or pelvis. Reproductive: Status post hysterectomy.  Ovaries are a trophic. Other: No significant volume of ascites.  No pneumoperitoneum. Musculoskeletal: Chronic appearing compression fracture of superior endplate of L1 with [id number] loss of anterior vertebral body height. There are no aggressive appearing lytic or blastic lesions noted in the visualized portions of the skeleton. IMPRESSION: 1. No evidence of significant acute traumatic injury to the abdomen or pelvis. 2. Mild intra and extrahepatic biliary ductal dilatation. No choledocholithiasis. No definite pancreatic head mass. These  findings  could simply be age related, however, if there is any biochemical evidence of biliary tract obstruction, further evaluation with abdominal MRI with and without IV gadolinium with MRCP should be considered to exclude the possibility of small ampullary neoplasm. 3. Cardiomegaly. 4. Aortic atherosclerosis. Electronically Signed   By: Vinnie Langton M.D.   On: 03/22/2019 15:41   Dg Chest Portable 1 View  Result Date: 03/22/2019 CLINICAL DATA:  Pain after fall. EXAM: PORTABLE CHEST 1 VIEW COMPARISON:  None. FINDINGS: The heart size and mediastinal contours are within normal limits. Both lungs are clear. No pneumothorax or pleural effusion is noted. Sternotomy wires are noted. Left-sided pacemaker is unchanged in position. The visualized skeletal structures are unremarkable. IMPRESSION: No active disease. Electronically Signed   By: Marijo Conception M.D.   On: 03/22/2019 12:46   Dg Humerus Left  Result Date: 03/22/2019 CLINICAL DATA:  Tripped on file, history of atrial fibrillation. EXAM: LEFT HUMERUS - 2+ VIEW COMPARISON:  None FINDINGS: There is no evidence of fracture or other focal bone lesions. Soft tissues are unremarkable. IMPRESSION: Negative. Electronically Signed   By: Zetta Bills M.D.   On: 03/22/2019 12:47    Impression: Very pleasant 85 year old female who was admitted for multiple complaints including acute anemia.  Began having hematochezia 2 days ago.  This is in the setting of supratherapeutic INR with an INR "greater than 10".  She has received 2 doses of vitamin K.  Recheck INR is pending.  She denies any previous hematochezia or melena.  Denies previous endoscopic evaluation.  We were also asked to evaluate elevated LFTs and bile duct dilation.  She is generally asymptomatic in this regards.  Anemia: Likely lower GI bleed given frank hematochezia.  She has been getting transfusions.  Query specific source of bleeding versus generalized mucosal bleeding in the setting of  supratherapeutic INR.  She is amenable to colonoscopy if we need to proceed.  At this point she is not an endoscopic candidate given her significantly elevated INR.  Recheck is pending after 2 doses of vitamin K.  She is no longer having acute symptoms of anemia.  Elevated LFTs/biliary dilation: LFTs as per HFP and specifically noted elevated alkaline phosphatase and total bilirubin at 2.0.  CT of the abdomen and pelvis found mild intra-and extrahepatic biliary duct dilation without obvious choledocholithiasis or discrete pancreatic head mass.  Query age-related but recommended MRI/MRCP if biochemical changes.  CBD diameter measured at 12 mm in the porta hepatis.  No obvious calcified stone in the CBD.  No other obvious hepatic abnormalities.  Unfortunately, I spoke to [place] with [employer] and the patient's pacemaker system is not compatible with MRI.  Plan: 1. Trend LFTs 2. INR management per hospitalist 3. Monitor for acute bleeding 4. Follow hgb closely 5. Transfuse as necessary 6. Supportive measures   ADDENDUM: Spoke with Dr. Thornton Papas who recommended CT Hepatic protocol to give more information about the biliary system (more than U/S or standard CT); order entered.   LOS: 1 day     03/23/2019, 8:16 AM

## 2019-03-23 NOTE — Progress Notes (Signed)
Initial Nutrition Assessment  DOCUMENTATION CODES:   Not applicable  INTERVENTION:  -Boost Breeze po TID, each supplement provides 250 kcal and 9 grams of protein  NUTRITION DIAGNOSIS:   Inadequate oral intake related to poor appetite as evidenced by per patient/family report, energy intake < 75% for > 7 days(moderate fat depletion to orbital/buccal regions).   GOAL:   Patient will meet greater than or equal to 90% of their needs   MONITOR:   PO intake, Labs, Weight trends, Diet advancement, I & O's, Skin  REASON FOR ASSESSMENT:   Malnutrition Screening Tool    ASSESSMENT:  85 year old female with medical history significant for A-fib s/p pacemaker implant 2008, depression, ischemic cardiomyopathy, CAD s/p CABG, CKD, hypothyroidism who presents to ED via EMS with reports of a fall. Patient reports dizziness when standing with subsequent falls x2 and small amount of blood in BMs. Abdominal CT showed mild intra/extrahepatic biliary ductal dilation and admitted for further evaluation.   Patient admitted with acute symptomatic blood loss anemia; +FOBT and is s/p 1 unit PRBC since admission.  Per chart review; likely lower GI bleed given hematochezia. Patient amenable to colonoscopy; currently not endoscopic candidate given elevated INR and has received 2 doses of vit K; recheck INR pending.   Patient resting at time of visit. Patient had limited engagement with RD and unable to obtain detailed nutrition history. Patient reports poor appetite prior to admission; endorses 1 meal/day. She stated that her daughter fixes all kinds of things for dinner and recalls chicken and green beans as favorites. Patient will occasionally have a donut for breakfast and reports snacking if she is hungry "maybe a banana or something" Opened Boost Breeze on bedside tray; patient stated "it's alright I guess" RD educated on supplement as source of calories and protein while on clear liquid diet as well as  additional flavors offered. When at home, patient reports drinking Boost and amenable to Ensure with diet advancement.   Patient endorses well fitting dentures; denies any chewing/swallowing difficulties.   Patient is at risk for malnutrition given recent weight losses, reported poor appetite, and suspected inadequate intake from dietary recall. RD explained NFPE and requested to complete exam; patient denied and stated "I haven't lost any muscles, my muscles are fine" Upon appearance; patient with moderate orbital/buccal depletions and suspect presence of additional areas of fat/muscle depletion. Will continue to monitor for po intake and provide appropriate nutrition supplement with diet advances.   I/Os: +1691 ml since admit Current wt 54.5 kg (120 lb) UBW 110 lb per pt  Weight history reviewed; noted 3.7 lb wt loss x 1 month which is insignificant for time frame, still concerning given advanced age. Patient with stable weight history; 55.5 kg - 56.2 kg over the past year.  03/23/19 54.5 kg  02/16/19 56.2 kg  07/09/18 55.5 kg  03/02/18 55.5 kg  01/17/18 56.2 kg  11/30/16 48.5 kg    Medications reviewed and include: protonix, synthroid, cymbalta  Labs: Glucose 110 Hgb 7.5 - trending up s/p 1 unit PRBC Ca corrected for low albumin 8.5 (L) WBC 10.9  NUTRITION - FOCUSED PHYSICAL EXAM: Unable to complete; patient declined    Diet Order:   Diet Order            Diet clear liquid Room service appropriate? Yes; Fluid consistency: Thin  Diet effective now              EDUCATION NEEDS:   No education needs have been  identified at this time  Skin:  Skin Assessment: Reviewed RN Assessment(non pressure wound; posterior; head; from fall)  Last BM:  10/9; smear; red  Height:   Ht Readings from Last 1 Encounters:  03/22/19 5\' 6"  (1.676 m)    Weight:   Wt Readings from Last 1 Encounters:  03/23/19 54.5 kg    Ideal Body Weight:  59.1 kg  BMI:  Body mass index is 19.39  kg/m.  Estimated Nutritional Needs:   Kcal:  1635-1800  Protein:  65-76  Fluid:  >/= 1.4 L/day   Lajuan Lines, RD, LDN Clinical Nutrition Office [phone] After Hours/Weekend Pager: [phone]

## 2019-03-23 NOTE — Progress Notes (Signed)
Pt transported to CT with SWOT RN, Rip Harbour, at this time. Report was given to [facility] RN.

## 2019-03-23 NOTE — Progress Notes (Signed)
Per pt's daughter, Clarise Cruz, pt manages her own medications at home. States pt puts her medications in a cap and tells her daughter it is her morning medications, and then takes several medications throughout the day as well. Daughter does not monitor medication administration but states it is possible pt is not administering them as prescribed. Advised daughter pt has exhibited some forgetful behaviors during stay, but daughter denies hx of dementia.

## 2019-03-23 NOTE — Progress Notes (Addendum)
PROGRESS NOTE    Angelica Hernandez  [id number] DOB: [birth year] DOA: 03/22/2019 PCP: Caryl Bis, MD   Brief Narrative:  Per HPI: Angelica Hernandez is a 85 y.o. female with medical history significant for  Atria fib, pacemaker status, depression, ischemic cardiomyopathy, coronary artery disease.  Patient was brought to the ED via EMS with reports of a fall.  Patient is alone, she is able to answer questions.  She tells me she has had abdominal pain for 2 days now-Upper abdomen.  Patient reports she fell yesterday and today.  She reports dizziness when standing, which caused her fall yesterday.  Today she is unable to tell me the circumstances surrounding her fall, but per notes patient was straining to have a bowel movement when she fell.  She hit the back of her head when she fell.  She also reports a dry cough of 2 days, no difficulty breathing.  She denies chest pain.  She is unaware of rapid heart rate. Patient reports yesterday and today she had bowel movements that had small amount of blood.  She denies black stools.  No vomiting of blood.  10/9: Patient was admitted with acute symptomatic blood loss anemia and had just received 1 unit PRBCs, but will likely need more.  Heart rates demonstrate improved control at this time, while on Cardizem drip.  Assessment & Plan:   Active Problems:   Cardiac pacemaker in situ   Atrial fibrillation (HCC)   Supratherapeutic INR   Acute anemia   CAD (coronary artery disease)   Atrial fibrillation with RVR (HCC)   Acute symptomatic blood loss anemia -Continue on IV Protonix twice daily for now -Continue n.p.o. until GI consultation -Follow hemoglobin and hematocrit and transfuse as needed, repeat CBC pending -She is status post 1 unit PRBC.  Atrial fibrillation with RVR and supratherapeutic INR -Holding home warfarin -Vitamin K 5 mg IV given in ED -INR recheck pending -Cardizem drip started for rate control, but rates are now in the 70s.   Will maintain on drip until stabilized from GI perspective and discontinue if heart rate less than 60 bpm. -Continue home metoprolol with increased dose to 100 mg every 12 hours -Patient is status post pacemaker interrogation with no evidence of lethal arrhythmias -Hold benazepril -TSH 1.4  Ischemic cardiomyopathy -History of three-vessel CABG in 1970 -No chest pain currently noted -High-sensitivity troponin noted to be 8 -Hold home atorvastatin due to transaminitis -Hold benazepril for now  Abdominal pain with mild transaminitis -CT of the abdomen and pelvis with concern for biliary ductal dilatation -GI to evaluate for MRCP -Lipase 18 -Continue hydration -Repeat labs pending  Multiple falls -Likely secondary to acute anemia and A. fib with RVR -Head and cervical CT negative for acute abnormalities -PT evaluation once stabilized   DVT prophylaxis: SCDs with supratherapeutic INR Code Status: Full code Family Communication: We will plan to update family Disposition Plan: Per GI.  Transfuse ongoing as needed.  Continue heart rate control with Cardizem drip for now.   Consultants:   GI  Procedures:   None  Antimicrobials:   None   Subjective: Patient seen and evaluated today with no new acute complaints or concerns. No acute concerns or events noted overnight.  She denies any abdominal pain, nausea or vomiting.  She denies chest pain or shortness of breath.  Objective: Vitals:   03/23/19 0545 03/23/19 0600 03/23/19 0727 03/23/19 0728  BP:    107/82  Pulse: 72 60  80  Resp: 15 18  17 12  Temp:      TempSrc:      SpO2: 93% 95%  99%  Weight:      Height:        Intake/Output Summary (Last 24 hours) at 03/23/2019 0819 Last data filed at 03/23/2019 0733 Gross per 24 hour  Intake 1691.18 ml  Output 350 ml  Net 1341.18 ml   Filed Weights   03/22/19 1107 03/22/19 2333 03/23/19 0500  Weight: 49.9 kg 54.5 kg 54.5 kg    Examination:  General exam: Appears  calm and comfortable  Respiratory system: Clear to auscultation. Respiratory effort normal.  Currently on room air Cardiovascular system: S1 & S2 heard, irregular, but rate controlled. No JVD, murmurs, rubs, gallops or clicks. No pedal edema. Gastrointestinal system: Abdomen is nondistended, soft and nontender. No organomegaly or masses felt. Normal bowel sounds heard. Central nervous system: Alert and oriented. No focal neurological deficits. Extremities: Symmetric 5 x 5 power. Skin: No rashes, lesions or ulcers Psychiatry: Judgement and insight appear normal. Mood & affect appropriate.     Data Reviewed: I have personally reviewed following labs and imaging studies  CBC: Recent Labs  Lab 03/22/19 1248 03/22/19 2013 03/23/19 0017  WBC 14.5*  --  10.8*  NEUTROABS 11.1*  --   --   HGB 8.3* 6.5* 5.7*  HCT 26.4* 19.0* 17.1*  MCV 96.7  --  90.0  PLT 224  --  99991111   Basic Metabolic Panel: Recent Labs  Lab 03/22/19 1248  NA 141  K 3.9  CL 108  CO2 21*  GLUCOSE 127*  BUN 23  CREATININE 0.61  CALCIUM 8.5*  MG 1.8   GFR: Estimated Creatinine Clearance: 46.6 mL/min (by C-G formula based on SCr of 0.61 mg/dL). Liver Function Tests: Recent Labs  Lab 03/22/19 1248  AST 78*  ALT 55*  ALKPHOS 169*  BILITOT 2.0*  PROT 6.0*  ALBUMIN 3.4*   Recent Labs  Lab 03/22/19 1959  LIPASE 18   Recent Labs  Lab 03/22/19 1248  AMMONIA 21   Coagulation Profile: Recent Labs  Lab 03/22/19 1440  INR >10.0*   Cardiac Enzymes: No results for input(s): CKTOTAL, CKMB, CKMBINDEX, TROPONINI in the last 168 hours. BNP (last 3 results) No results for input(s): PROBNP in the last 8760 hours. HbA1C: No results for input(s): HGBA1C in the last 72 hours. CBG: No results for input(s): GLUCAP in the last 168 hours. Lipid Profile: No results for input(s): CHOL, HDL, LDLCALC, TRIG, CHOLHDL, LDLDIRECT in the last 72 hours. Thyroid Function Tests: Recent Labs    03/22/19 1248  TSH 1.464    Anemia Panel: No results for input(s): VITAMINB12, FOLATE, FERRITIN, TIBC, IRON, RETICCTPCT in the last 72 hours. Sepsis Labs: No results for input(s): PROCALCITON, LATICACIDVEN in the last 168 hours.  Recent Results (from the past 240 hour(s))  SARS CORONAVIRUS 2 (TAT 6-24 HRS) Nasopharyngeal Nasopharyngeal Swab     Status: None   Collection Time: 03/22/19  5:26 PM   Specimen: Nasopharyngeal Swab  Result Value Ref Range Status   SARS Coronavirus 2 NEGATIVE NEGATIVE Final    Comment: (NOTE) SARS-CoV-2 target nucleic acids are NOT DETECTED. The SARS-CoV-2 RNA is generally detectable in upper and lower respiratory specimens during the acute phase of infection. Negative results do not preclude SARS-CoV-2 infection, do not rule out co-infections with other pathogens, and should not be used as the sole basis for treatment or other patient management decisions. Negative results must be combined with clinical observations,  patient history, and epidemiological information. The expected result is Negative. Fact Sheet for Patients: SugarRoll.be Fact Sheet for Healthcare Providers: https://www.woods-mathews.com/ This test is not yet approved or cleared by the [place] FDA and  has been authorized for detection and/or diagnosis of SARS-CoV-2 by FDA under an Emergency Use Authorization (EUA). This EUA will remain  in effect (meaning this test can be used) for the duration of the COVID-19 declaration under Section 56 4(b)(1) of the Act, 21 U.S.C. section 360bbb-3(b)(1), unless the authorization is terminated or revoked sooner. Performed at [facility], [place] [address] [place], [place] [address]   MRSA PCR Screening     Status: None   Collection Time: 03/22/19 11:17 PM   Specimen: Nasal Mucosa; Nasopharyngeal  Result Value Ref Range Status   MRSA by PCR NEGATIVE NEGATIVE Final    Comment:        The GeneXpert MRSA Assay (FDA approved  for NASAL specimens only), is one component of a comprehensive MRSA colonization surveillance program. It is not intended to diagnose MRSA infection nor to guide or monitor treatment for MRSA infections. Performed at [facility], [address], [place], [place] [address]          Radiology Studies: Ct Head Wo Contrast  Result Date: 03/22/2019 CLINICAL DATA:  Fall in bathroom striking back of head. EXAM: CT HEAD WITHOUT CONTRAST CT CERVICAL SPINE WITHOUT CONTRAST TECHNIQUE: Multidetector CT imaging of the head and cervical spine was performed following the standard protocol without intravenous contrast. Multiplanar CT image reconstructions of the cervical spine were also generated. COMPARISON:  None. FINDINGS: CT HEAD FINDINGS Brain: Ventricles and cisterns are within normal. There is mild age related atrophy as well as chronic ischemic microvascular disease. There is no mass, mass effect, shift of midline structures or acute hemorrhage. Suggestion of an old small left posterior watershed infarct. No acute infarction. Vascular: No hyperdense vessel or unexpected calcification. Skull: Normal. Negative for fracture or focal lesion. Sinuses/Orbits: No acute finding. Other: None. CT CERVICAL SPINE FINDINGS Alignment: Normal. Skull base and vertebrae: Mild spondylosis of the cervical spine. Vertebral body heights are maintained. Atlantoaxial articulation is normal. Mild uncovertebral joint spurring. Mild left-sided neural from narrowing at the C5-6 level and right-sided neural foraminal narrowing at the C6-7 level. No acute fracture or subluxation. Soft tissues and spinal canal: No prevertebral fluid or swelling. No visible canal hematoma. Disc levels:  Mild disc space narrowing at the C5-6 and C6-7 levels. Upper chest: No acute findings. Mild calcified plaque over the aortic arch. Other: None. IMPRESSION: 1.  No acute brain injury. 2. Mild chronic ischemic microvascular disease and age related  atrophy. 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 3.  No acute cervical spine injury. 4. Mild spondylosis of the cervical spine with disc disease at the C5-6 and C6-7 levels and mild neural foraminal narrowing as described. 5.  Aortic Atherosclerosis (ICD10-I70.0). Electronically Signed   By: Marin Olp M.D.   On: 03/22/2019 15:41   Ct Cervical Spine Wo Contrast  Result Date: 03/22/2019 CLINICAL DATA:  Fall in bathroom striking back of head. EXAM: CT HEAD WITHOUT CONTRAST CT CERVICAL SPINE WITHOUT CONTRAST TECHNIQUE: Multidetector CT imaging of the head and cervical spine was performed following the standard protocol without intravenous contrast. Multiplanar CT image reconstructions of the cervical spine were also generated. COMPARISON:  None. FINDINGS: CT HEAD FINDINGS Brain: Ventricles and cisterns are within normal. There is mild age related atrophy as well as chronic ischemic microvascular disease. There  is no mass, mass effect, shift of midline structures or acute hemorrhage. Suggestion of an old small left posterior watershed infarct. No acute infarction. Vascular: No hyperdense vessel or unexpected calcification. Skull: Normal. Negative for fracture or focal lesion. Sinuses/Orbits: No acute finding. Other: None. CT CERVICAL SPINE FINDINGS Alignment: Normal. Skull base and vertebrae: Mild spondylosis of the cervical spine. Vertebral body heights are maintained. Atlantoaxial articulation is normal. Mild uncovertebral joint spurring. Mild left-sided neural from narrowing at the C5-6 level and right-sided neural foraminal narrowing at the C6-7 level. No acute fracture or subluxation. Soft tissues and spinal canal: No prevertebral fluid or swelling. No visible canal hematoma. Disc levels:  Mild disc space narrowing at the C5-6 and C6-7 levels. Upper chest: No acute findings. Mild calcified plaque over the aortic arch. Other: None. IMPRESSION: 1.  No acute brain injury. 2. Mild chronic ischemic  microvascular disease and age related atrophy. 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 3.  No acute cervical spine injury. 4. Mild spondylosis of the cervical spine with disc disease at the C5-6 and C6-7 levels and mild neural foraminal narrowing as described. 5.  Aortic Atherosclerosis (ICD10-I70.0). Electronically Signed   By: Marin Olp M.D.   On: 03/22/2019 15:41   Ct Abdomen Pelvis W Contrast  Result Date: 03/22/2019 CLINICAL DATA:  85 year old female with history of trauma from a fall in the bathroom. Abdominal pain. Blood in stool. EXAM: CT ABDOMEN AND PELVIS WITH CONTRAST TECHNIQUE: Multidetector CT imaging of the abdomen and pelvis was performed using the standard protocol following bolus administration of intravenous contrast. CONTRAST:  18mL OMNIPAQUE IOHEXOL 300 MG/ML  SOLN COMPARISON:  None. FINDINGS: Lower chest: Aortic atherosclerosis. Mild cardiomegaly. Pacemaker lead extending in the right ventricle. Status post median sternotomy. Hepatobiliary: No suspicious cystic or solid hepatic lesions. Very mild intrahepatic biliary ductal dilatation, particularly and segments 2 and 3 of the liver. Common bile duct is dilated measuring up to 12 mm in the porta hepatis. No definite calcified stone in the common bile duct. Gallbladder is moderately distended. No definite calcified gallstones. No gallbladder wall thickening or pericholecystic fluid or inflammatory changes. Pancreas: No pancreatic mass. No pancreatic ductal dilatation. No pancreatic or peripancreatic fluid collections or inflammatory changes. Spleen: Unremarkable. Adrenals/Urinary Tract: 1.3 cm low-attenuation lesion in the upper pole the right kidney, compatible with a simple cyst. Subcentimeter low-attenuation lesion in the anterior aspect of the interpolar region of the left kidney, too small to characterize, but statistically likely to represent a cyst. No hydroureteronephrosis. Urinary bladder is normal in appearance. Bilateral  adrenal glands are normal in appearance. Stomach/Bowel: Normal appearance of the stomach. No pathologic dilatation of small bowel or colon. The appendix is not confidently identified and may be surgically absent. Regardless, there are no inflammatory changes noted adjacent to the cecum to suggest the presence of an acute appendicitis at this time. Vascular/Lymphatic: Aortic atherosclerosis, without evidence of aneurysm or dissection in the abdominal or pelvic vasculature. No lymphadenopathy noted in the abdomen or pelvis. Reproductive: Status post hysterectomy.  Ovaries are a trophic. Other: No significant volume of ascites.  No pneumoperitoneum. Musculoskeletal: Chronic appearing compression fracture of superior endplate of L1 with [id number] loss of anterior vertebral body height. There are no aggressive appearing lytic or blastic lesions noted in the visualized portions of the skeleton. IMPRESSION: 1. No evidence of significant acute traumatic injury to the abdomen or pelvis. 2. Mild intra and extrahepatic biliary ductal dilatation. No choledocholithiasis. No definite pancreatic head mass. These findings could simply be  age related, however, if there is any biochemical evidence of biliary tract obstruction, further evaluation with abdominal MRI with and without IV gadolinium with MRCP should be considered to exclude the possibility of small ampullary neoplasm. 3. Cardiomegaly. 4. Aortic atherosclerosis. Electronically Signed   By: Vinnie Langton M.D.   On: 03/22/2019 15:41   Dg Chest Portable 1 View  Result Date: 03/22/2019 CLINICAL DATA:  Pain after fall. EXAM: PORTABLE CHEST 1 VIEW COMPARISON:  None. FINDINGS: The heart size and mediastinal contours are within normal limits. Both lungs are clear. No pneumothorax or pleural effusion is noted. Sternotomy wires are noted. Left-sided pacemaker is unchanged in position. The visualized skeletal structures are unremarkable. IMPRESSION: No active disease. Electronically  Signed   By: Marijo Conception M.D.   On: 03/22/2019 12:46   Dg Humerus Left  Result Date: 03/22/2019 CLINICAL DATA:  Tripped on file, history of atrial fibrillation. EXAM: LEFT HUMERUS - 2+ VIEW COMPARISON:  None FINDINGS: There is no evidence of fracture or other focal bone lesions. Soft tissues are unremarkable. IMPRESSION: Negative. Electronically Signed   By: Zetta Bills M.D.   On: 03/22/2019 12:47        Scheduled Meds:  atorvastatin  10 mg Oral q1800   Chlorhexidine Gluconate Cloth  6 each Topical Daily   DULoxetine  30 mg Oral Daily   levothyroxine  50 mcg Oral Q0600   metoprolol tartrate  100 mg Oral BID   pantoprazole (PROTONIX) IV  40 mg Intravenous Q12H   Continuous Infusions:  sodium chloride 75 mL/hr at 03/23/19 0600   diltiazem (CARDIZEM) infusion 5 mg/hr (03/23/19 0600)     LOS: 1 day    Time spent: 30 minutes    Makail Watling Darleen Crocker, DO [facility] Pager [phone]  If 7PM-7AM, please contact night-coverage [email or web address] Password [email or web address] 03/23/2019, 8:19 AM

## 2019-03-23 NOTE — Progress Notes (Signed)
Patient is at rest while RN is present in the room sitting but immediately attempts to get up if no one is present. Patient states that she will not take any medication as she "no longer takes any".

## 2019-03-23 NOTE — Progress Notes (Signed)
In to ask patient to remain in bed as patient was trying to remove monitors IVs and get out of bed unassisted... asked patient to allow help and was met with attempts to hit staff and profanity. Pt continued to try to hit staff and use profanity as other RNs arrived. Pt relented and was repositioned in bed for comfort and mid level was contacted.

## 2019-03-23 NOTE — Care Management Important Message (Signed)
Important Message  Patient Details  Name: Angelica Hernandez MRN: [id number] Date of Birth: Dec 02, 1933   Medicare Important Message Given:  Yes     Tommy Medal 03/23/2019, 12:40 PM

## 2019-03-24 LAB — PREPARE RBC (CROSSMATCH)

## 2019-03-24 LAB — CBC
HCT: 21.1 % — ABNORMAL LOW (ref 36.0–46.0)
Hemoglobin: 7 g/dL — ABNORMAL LOW (ref 12.0–15.0)
MCH: 30 pg (ref 26.0–34.0)
MCHC: 33.2 g/dL (ref 30.0–36.0)
MCV: 90.6 fL (ref 80.0–100.0)
Platelets: 195 10*3/uL (ref 150–400)
RBC: 2.33 MIL/uL — ABNORMAL LOW (ref 3.87–5.11)
RDW: 14.9 % (ref 11.5–15.5)
WBC: 11.6 10*3/uL — ABNORMAL HIGH (ref 4.0–10.5)
nRBC: 0.6 % — ABNORMAL HIGH (ref 0.0–0.2)

## 2019-03-24 LAB — HEPATIC FUNCTION PANEL
ALT: 31 U/L (ref 0–44)
AST: 45 U/L — ABNORMAL HIGH (ref 15–41)
Albumin: 3.1 g/dL — ABNORMAL LOW (ref 3.5–5.0)
Alkaline Phosphatase: 99 U/L (ref 38–126)
Bilirubin, Direct: 0.5 mg/dL — ABNORMAL HIGH (ref 0.0–0.2)
Indirect Bilirubin: 0.7 mg/dL (ref 0.3–0.9)
Total Bilirubin: 1.2 mg/dL (ref 0.3–1.2)
Total Protein: 5.3 g/dL — ABNORMAL LOW (ref 6.5–8.1)

## 2019-03-24 LAB — PROTIME-INR
INR: 1.1 (ref 0.8–1.2)
Prothrombin Time: 14.3 seconds (ref 11.4–15.2)

## 2019-03-24 LAB — BASIC METABOLIC PANEL
Anion gap: 7 (ref 5–15)
BUN: 9 mg/dL (ref 8–23)
CO2: 23 mmol/L (ref 22–32)
Calcium: 8.2 mg/dL — ABNORMAL LOW (ref 8.9–10.3)
Chloride: 109 mmol/L (ref 98–111)
Creatinine, Ser: 0.43 mg/dL — ABNORMAL LOW (ref 0.44–1.00)
GFR calc Af Amer: >60 mL/min (ref >60)
GFR calc non Af Amer: >60 mL/min (ref >60)
Glucose, Bld: 113 mg/dL — ABNORMAL HIGH (ref 70–99)
Potassium: 2.9 mmol/L — ABNORMAL LOW (ref 3.5–5.1)
Sodium: 139 mmol/L (ref 135–145)

## 2019-03-24 LAB — MAGNESIUM: Magnesium: 1.6 mg/dL — ABNORMAL LOW (ref 1.7–2.4)

## 2019-03-24 MED ORDER — MAGNESIUM SULFATE 2 GM/50ML IV SOLN
2.0000 g | Freq: Once | INTRAVENOUS | Status: AC
  Administered 2019-03-24: 11:00:00 2 g via INTRAVENOUS
  Filled 2019-03-24: qty 50

## 2019-03-24 MED ORDER — LORAZEPAM 2 MG/ML IJ SOLN: 0.5000 mg | Freq: Four times a day (QID) | INTRAMUSCULAR | Status: DC | PRN

## 2019-03-24 MED ORDER — SODIUM CHLORIDE 0.9% IV SOLUTION
Freq: Once | INTRAVENOUS | Status: AC
  Administered 2019-03-24: 11:00:00 via INTRAVENOUS

## 2019-03-24 MED ORDER — PEG 3350-KCL-NA BICARB-NACL 420 G PO SOLR
4000.0000 mL | Freq: Once | ORAL | Status: AC
  Administered 2019-03-25: 4000 mL via ORAL

## 2019-03-24 MED ORDER — POTASSIUM CHLORIDE CRYS ER 20 MEQ PO TBCR
40.0000 meq | EXTENDED_RELEASE_TABLET | Freq: Two times a day (BID) | ORAL | Status: AC
  Administered 2019-03-24 (×2): 40 meq via ORAL
  Filled 2019-03-24 (×2): qty 2

## 2019-03-24 NOTE — Progress Notes (Addendum)
Patient is awake conversant. Denies abdominal pain Discussed with nursing staff.  No evidence of any GI bleeding over the past 24 hours.  Cardizem drip discontinued this morning.  Vital signs in last 24 hours: Temp:  [97.9 F (36.6 C)-98.9 F (37.2 C)] 97.9 F (36.6 C) (10/10 1100) Pulse Rate:  [25-91] 58 (10/10 0200) Resp:  [13-27] 18 (10/10 1100) BP: (93-141)/(38-109) 141/71 (10/10 1100) SpO2:  [92 %-100 %] 97 % (10/10 1100) Weight:  [54.5 kg] 54.5 kg (10/10 0435) Last BM Date: 03/22/19 General: Awake alert.  Conversant.  Somewhat Somewhat confused.  She is not jaundiced Nondistended.  Positive bowel sounds soft nontender without appreciable mass organomegaly extremities:    Intake/Output from previous day: 10/09 0701 - 10/10 0700 In: 98.1 [I.V.:98.1] Out: 1400 [Urine:1400] Intake/Output this shift: No intake/output data recorded.  Lab Results: Recent Labs    03/23/19 0017 03/23/19 0814 03/24/19 0536  WBC 10.8* 10.9* 11.6*  HGB 5.7* 7.5* 7.0*  HCT 17.1* 23.4* 21.1*  PLT 155 183 195   BMET Recent Labs    03/22/19 1248 03/23/19 0814 03/24/19 0536  NA 141 139 139  K 3.9 3.6 2.9*  CL 108 111 109  CO2 21* 20* 23  GLUCOSE 127* 110* 113*  BUN 23 23 9   CREATININE 0.61 0.67 0.43*  CALCIUM 8.5* 7.8* 8.2*   LFT Recent Labs    03/24/19 0536  PROT 5.3*  ALBUMIN 3.1*  AST 45*  ALT 31  ALKPHOS 99  BILITOT 1.2  BILIDIR 0.5*  IBILI 0.7   PT/INR Recent Labs    03/23/19 0814 03/24/19 0536  LABPROT 16.5* 14.3  INR 1.4* 1.1   Hepatitis Panel No results for input(s): HEPBSAG, HCVAB, HEPAIGM, HEPBIGM in the last 72 hours. C-Diff No results for input(s): CDIFFTOX in the last 72 hours.  Studies/Results: Ct Head Wo Contrast  Result Date: 03/22/2019 CLINICAL DATA:  Fall in bathroom striking back of head. EXAM: CT HEAD WITHOUT CONTRAST CT CERVICAL SPINE WITHOUT CONTRAST TECHNIQUE: Multidetector CT imaging of the head and cervical spine was performed following the  standard protocol without intravenous contrast. Multiplanar CT image reconstructions of the cervical spine were also generated. COMPARISON:  None. FINDINGS: CT HEAD FINDINGS Brain: Ventricles and cisterns are within normal. There is mild age related atrophy as well as chronic ischemic microvascular disease. There is no mass, mass effect, shift of midline structures or acute hemorrhage. Suggestion of an old small left posterior watershed infarct. No acute infarction. Vascular: No hyperdense vessel or unexpected calcification. Skull: Normal. Negative for fracture or focal lesion. Sinuses/Orbits: No acute finding. Other: None. CT CERVICAL SPINE FINDINGS Alignment: Normal. Skull base and vertebrae: Mild spondylosis of the cervical spine. Vertebral body heights are maintained. Atlantoaxial articulation is normal. Mild uncovertebral joint spurring. Mild left-sided neural from narrowing at the C5-6 level and right-sided neural foraminal narrowing at the C6-7 level. No acute fracture or subluxation. Soft tissues and spinal canal: No prevertebral fluid or swelling. No visible canal hematoma. Disc levels:  Mild disc space narrowing at the C5-6 and C6-7 levels. Upper chest: No acute findings. Mild calcified plaque over the aortic arch. Other: None. IMPRESSION: 1.  No acute brain injury. 2. Mild chronic ischemic microvascular disease and age related atrophy. 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 3.  No acute cervical spine injury. 4. Mild spondylosis of the cervical spine with disc disease at the C5-6 and C6-7 levels and mild neural foraminal narrowing as described. 5.  Aortic Atherosclerosis (ICD10-I70.0). Electronically Signed  By: Marin Olp M.D.   On: 03/22/2019 15:41   Ct Cervical Spine Wo Contrast  Result Date: 03/22/2019 CLINICAL DATA:  Fall in bathroom striking back of head. EXAM: CT HEAD WITHOUT CONTRAST CT CERVICAL SPINE WITHOUT CONTRAST TECHNIQUE: Multidetector CT imaging of the head and cervical spine  was performed following the standard protocol without intravenous contrast. Multiplanar CT image reconstructions of the cervical spine were also generated. COMPARISON:  None. FINDINGS: CT HEAD FINDINGS Brain: Ventricles and cisterns are within normal. There is mild age related atrophy as well as chronic ischemic microvascular disease. There is no mass, mass effect, shift of midline structures or acute hemorrhage. Suggestion of an old small left posterior watershed infarct. No acute infarction. Vascular: No hyperdense vessel or unexpected calcification. Skull: Normal. Negative for fracture or focal lesion. Sinuses/Orbits: No acute finding. Other: None. CT CERVICAL SPINE FINDINGS Alignment: Normal. Skull base and vertebrae: Mild spondylosis of the cervical spine. Vertebral body heights are maintained. Atlantoaxial articulation is normal. Mild uncovertebral joint spurring. Mild left-sided neural from narrowing at the C5-6 level and right-sided neural foraminal narrowing at the C6-7 level. No acute fracture or subluxation. Soft tissues and spinal canal: No prevertebral fluid or swelling. No visible canal hematoma. Disc levels:  Mild disc space narrowing at the C5-6 and C6-7 levels. Upper chest: No acute findings. Mild calcified plaque over the aortic arch. Other: None. IMPRESSION: 1.  No acute brain injury. 2. Mild chronic ischemic microvascular disease and age related atrophy. 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 3.  No acute cervical spine injury. 4. Mild spondylosis of the cervical spine with disc disease at the C5-6 and C6-7 levels and mild neural foraminal narrowing as described. 5.  Aortic Atherosclerosis (ICD10-I70.0). Electronically Signed   By: Marin Olp M.D.   On: 03/22/2019 15:41   Ct Abdomen Pelvis W Contrast  Result Date: 03/22/2019 CLINICAL DATA:  85 year old female with history of trauma from a fall in the bathroom. Abdominal pain. Blood in stool. EXAM: CT ABDOMEN AND PELVIS WITH CONTRAST  TECHNIQUE: Multidetector CT imaging of the abdomen and pelvis was performed using the standard protocol following bolus administration of intravenous contrast. CONTRAST:  63mL OMNIPAQUE IOHEXOL 300 MG/ML  SOLN COMPARISON:  None. FINDINGS: Lower chest: Aortic atherosclerosis. Mild cardiomegaly. Pacemaker lead extending in the right ventricle. Status post median sternotomy. Hepatobiliary: No suspicious cystic or solid hepatic lesions. Very mild intrahepatic biliary ductal dilatation, particularly and segments 2 and 3 of the liver. Common bile duct is dilated measuring up to 12 mm in the porta hepatis. No definite calcified stone in the common bile duct. Gallbladder is moderately distended. No definite calcified gallstones. No gallbladder wall thickening or pericholecystic fluid or inflammatory changes. Pancreas: No pancreatic mass. No pancreatic ductal dilatation. No pancreatic or peripancreatic fluid collections or inflammatory changes. Spleen: Unremarkable. Adrenals/Urinary Tract: 1.3 cm low-attenuation lesion in the upper pole the right kidney, compatible with a simple cyst. Subcentimeter low-attenuation lesion in the anterior aspect of the interpolar region of the left kidney, too small to characterize, but statistically likely to represent a cyst. No hydroureteronephrosis. Urinary bladder is normal in appearance. Bilateral adrenal glands are normal in appearance. Stomach/Bowel: Normal appearance of the stomach. No pathologic dilatation of small bowel or colon. The appendix is not confidently identified and may be surgically absent. Regardless, there are no inflammatory changes noted adjacent to the cecum to suggest the presence of an acute appendicitis at this time. Vascular/Lymphatic: Aortic atherosclerosis, without evidence of aneurysm or dissection in the  abdominal or pelvic vasculature. No lymphadenopathy noted in the abdomen or pelvis. Reproductive: Status post hysterectomy.  Ovaries are a trophic. Other: No  significant volume of ascites.  No pneumoperitoneum. Musculoskeletal: Chronic appearing compression fracture of superior endplate of L1 with [id number] loss of anterior vertebral body height. There are no aggressive appearing lytic or blastic lesions noted in the visualized portions of the skeleton. IMPRESSION: 1. No evidence of significant acute traumatic injury to the abdomen or pelvis. 2. Mild intra and extrahepatic biliary ductal dilatation. No choledocholithiasis. No definite pancreatic head mass. These findings could simply be age related, however, if there is any biochemical evidence of biliary tract obstruction, further evaluation with abdominal MRI with and without IV gadolinium with MRCP should be considered to exclude the possibility of small ampullary neoplasm. 3. Cardiomegaly. 4. Aortic atherosclerosis. Electronically Signed   By: Vinnie Langton M.D.   On: 03/22/2019 15:41   Ct Abdomen W Wo Contrast  Result Date: 03/23/2019 CLINICAL DATA:  Abnormal liver function tests. EXAM: CT ABDOMEN WITHOUT AND WITH CONTRAST TECHNIQUE: Multidetector CT imaging of the abdomen was performed following the standard protocol before and following the bolus administration of intravenous contrast. CONTRAST:  130mL OMNIPAQUE IOHEXOL 300 MG/ML  SOLN COMPARISON:  01/20/2019 FINDINGS: Lower chest: Centrilobular emphsyema noted. Hepatobiliary: No hypervascular lesion in the liver parenchyma on arterial phase postcontrast imaging. Gallbladder is distended with intermediate attenuation material in the lumen, potentially sludge or vicarious excretion of contrast from yesterday's CT scan. Trace pericholecystic fluid noted. As noted yesterday, there is mild intrahepatic and marked extrahepatic biliary duct dilatation. Extrahepatic common duct measures 11 mm diameter today compared to 12 mm yesterday. Common bile duct in the head of the pancreas is 12 mm diameter. The bile in the common duct is low-density, approaching water  attenuation at 11 Hounsfield units. This becomes gradually higher in attenuation towards the ampulla where average attenuation of luminal contents in the common bile duct at the head of pancreas are 49 Hounsfield units on postcontrast series 9, image 40. There is an abrupt cut off of the pancreatic duct at the ampulla. Pancreas: Pancreatic parenchyma is atrophic without dilatation of the main pancreatic duct. No pancreatic head mass evident. Spleen: No splenomegaly. No focal mass lesion. Adrenals/Urinary Tract: No adrenal nodule or mass. Small cyst again noted upper pole right kidney. Left kidney unremarkable. Stomach/Bowel: Tiny hiatal hernia. Stomach otherwise unremarkable. Duodenum is normally positioned as is the ligament of Treitz. No small bowel or colonic dilatation within the visualized abdomen. Vascular/Lymphatic: There is abdominal aortic atherosclerosis without aneurysm. There is no gastrohepatic or hepatoduodenal ligament lymphadenopathy. No intraperitoneal or retroperitoneal lymphadenopathy. Other: No intraperitoneal free fluid. Musculoskeletal: No worrisome lytic or sclerotic osseous abnormality. Mild superior endplate compression deformity at L1. IMPRESSION: 1. Similar appearance of intra and extrahepatic biliary duct dilatation with common bile duct dilatation extending right to the ampulla. Intraluminal contents of the common bile duct in the head of pancreas just proximal to the ampulla are higher attenuation than in the common duct at the level of the porta hepatis. This could be related to intraluminal sludge, noncalcified stone, or hemorrhage. This region does not appear to substantially enhance after IV contrast administration making soft tissue lesion less likely but not entirely excluded. Presence of a permanent pacemaker likely precludes MRCP. ERCP may be warranted to further evaluate given the abnormal attenuation in the distal common bile duct period. 2. Tiny hiatal hernia. 3.  Aortic  Atherosclerois (ICD10-170.0) Electronically Signed   By: Randall Hiss  Tery Sanfilippo M.D.   On: 03/23/2019 16:52   Dg Chest Portable 1 View  Result Date: 03/22/2019 CLINICAL DATA:  Pain after fall. EXAM: PORTABLE CHEST 1 VIEW COMPARISON:  None. FINDINGS: The heart size and mediastinal contours are within normal limits. Both lungs are clear. No pneumothorax or pleural effusion is noted. Sternotomy wires are noted. Left-sided pacemaker is unchanged in position. The visualized skeletal structures are unremarkable. IMPRESSION: No active disease. Electronically Signed   By: Marijo Conception M.D.   On: 03/22/2019 12:46   Dg Humerus Left  Result Date: 03/22/2019 CLINICAL DATA:  Tripped on file, history of atrial fibrillation. EXAM: LEFT HUMERUS - 2+ VIEW COMPARISON:  None FINDINGS: There is no evidence of fracture or other focal bone lesions. Soft tissues are unremarkable. IMPRESSION: Negative. Electronically Signed   By: Zetta Bills M.D.   On: 03/22/2019 12:47     Impression:  18. 85 year old lady with acute on chronic anemia report  f self-limiting rectal bleeding presents coagulopathic on Coumadin.  INR normalized with vitamin K.  Status post 2 units of packed RBCs.  Remains hemodynamically stable.  Hypokalemia.  Hypomagnesiumia.  2. Transient abdominal pain with biliary dilation on CT.  No evidence of stone disease or tumor.  LFTs Improved.    Recommendations:  1. Clear liquid diet.  Agree with need for endoscopic evaluation.  At this point, tentatively plan to perform EGD and colonoscopy on 10/12.  We will get assistance with anesthesia.  Agree with repleting electrolytes.  2. Outpatient endoscopic ultrasound to further evaluate dilated biliary tree and pancreas as MRI precluded  with implanted device.

## 2019-03-24 NOTE — Progress Notes (Addendum)
PROGRESS NOTE    Angelica Hernandez  [id number] DOB: 29-Feb-1934 DOA: 03/22/2019 PCP: Caryl Bis, MD   Brief Narrative:  Per HPI: Angelica Hernandez a 85 y.o.femalewith medical history significant forAtria fib,pacemaker status, depression, ischemic cardiomyopathy, coronary artery disease.Patient was brought to the ED via EMS with reports of a fall. Patient is alone, she is able to answer questions.She tells me she has had abdominal pain for 2 days now-Upper abdomen. Patient reports she fell yesterday and today.She reports dizziness when standing,which causedherfall yesterday. Today she is unable to tell me the circumstances surrounding her fall, but per notes patient was straining to have a bowel movement when she fell. She hit the back of her head when she fell. She also reports a drycough of 2 days,no difficulty breathing. She denies chest pain. She is unaware of rapid heart rate. Patient reports yesterday and today she had bowel movements that had small amount of blood. She denies black stools. No vomiting of blood.  10/9: Patient was admitted with acute symptomatic blood loss anemia and had just received 1 unit PRBCs, but will likely need more.  Heart rates demonstrate improved control at this time, while on Cardizem drip.  10/10: Patient still has hemoglobin of 7 and will require 1 more unit of PRBC transfusion.  LFTs are downtrending.  INR is now 1.1.  No further overt bleeding noted overnight.  Low potassium and magnesium noted which will be repleted.  Heart rates are controlled.  GI planning for likely EGD soon.  Assessment & Plan:   Active Problems:   Cardiac pacemaker in situ   Atrial fibrillation (HCC)   Supratherapeutic INR   Acute anemia   CAD (coronary artery disease)   Atrial fibrillation with RVR (HCC)   Gastrointestinal hemorrhage   Hypercoagulopathy (HCC)   Dilation of biliary tract   Elevated LFTs   Acute symptomatic blood loss  anemia -Continue on IV Protonix twice daily for now -Continue n.p.o. this a.m. and await further GI evaluation for EGD -She has received 1 unit PRBC on 10/9 -1 more unit PRBC transfusion on 10/10 given hemoglobin of 7  Atrial fibrillation with RVR and supratherapeutic INR-improved -Holding home warfarin until further GI evaluation -Vitamin K 5 mg IV given in ED and INR is 1.1 on 10/10 -Cardizem drip discontinued on 10/10 -Continue home metoprolol with increased dose to 100 mg every 12 hours -Patient is status post pacemaker interrogation with no evidence of lethal arrhythmias -Hold benazepril -TSH 1.4  Ischemic cardiomyopathy -History of three-vessel CABG in 1970 -No chest pain currently noted -High-sensitivity troponin noted to be 8 -Hold home atorvastatin due to transaminitis -Hold benazepril for now  Abdominal pain with mild transaminitis-downtrending -CT of the abdomen and pelvis with concern for biliary ductal dilatation even on repeat 10/9 -GI to evaluate for EGD, could not perform MRCP on account of pacemaker -Lipase 18 -Repeat labs ordered for a.m.  Multiple falls -Likely secondary to acute anemia and A. fib with RVR -Head and cervical CT negative for acute abnormalities -PT evaluation once stabilized  Hypomagnesemia/hypokalemia -Replete and reevaluate in a.m.  Agitation/delirium -Ativan ordered as needed   DVT prophylaxis: SCDs Code Status: Full code Family Communication: We will plan to update family Disposition Plan: Per GI.  Transfuse 1 more unit PRBCs today.  DC Cardizem drip and maintain on metoprolol.   Consultants:   GI  Procedures:   None  Antimicrobials:   None   Subjective: Patient seen and evaluated today with no new acute complaints  or concerns. No acute concerns or events noted overnight.  Her hemoglobin remains low this morning and therefore, she will require further transfusion.  Heart rates are stable and will discontinue  Cardizem drip.  She did have some agitation overnight for which Ativan was given.  Objective: Vitals:   03/24/19 0015 03/24/19 0100 03/24/19 0200 03/24/19 0435  BP: (!) 134/109 (!) 103/59 100/60   Pulse: (!) 47 66 (!) 58   Resp: 16 (!) 21 16   Temp:      TempSrc:      SpO2: 98% 98% 97%   Weight:    54.5 kg  Height:        Intake/Output Summary (Last 24 hours) at 03/24/2019 0810 Last data filed at 03/24/2019 0601 Gross per 24 hour  Intake 98.11 ml  Output 1050 ml  Net -951.89 ml   Filed Weights   03/22/19 2333 03/23/19 0500 03/24/19 0435  Weight: 54.5 kg 54.5 kg 54.5 kg    Examination:  General exam: Appears calm and comfortable, somnolent Respiratory system: Clear to auscultation. Respiratory effort normal.  On room air Cardiovascular system: S1 & S2 heard, irregular. No JVD, murmurs, rubs, gallops or clicks. No pedal edema. Gastrointestinal system: Abdomen is nondistended, soft and nontender. No organomegaly or masses felt. Normal bowel sounds heard. Central nervous system: Somnolent, but arousable Extremities: Symmetric 5 x 5 power. Skin: No rashes, lesions or ulcers Psychiatry: Flat affect    Data Reviewed: I have personally reviewed following labs and imaging studies  CBC: Recent Labs  Lab 03/22/19 1248 03/22/19 2013 03/23/19 0017 03/23/19 0814 03/24/19 0536  WBC 14.5*  --  10.8* 10.9* 11.6*  NEUTROABS 11.1*  --   --   --   --   HGB 8.3* 6.5* 5.7* 7.5* 7.0*  HCT 26.4* 19.0* 17.1* 23.4* 21.1*  MCV 96.7  --  90.0 92.9 90.6  PLT 224  --  155 183 0000000   Basic Metabolic Panel: Recent Labs  Lab 03/22/19 1248 03/23/19 0814 03/24/19 0536  NA 141 139 139  K 3.9 3.6 2.9*  CL 108 111 109  CO2 21* 20* 23  GLUCOSE 127* 110* 113*  BUN 23 23 9   CREATININE 0.61 0.67 0.43*  CALCIUM 8.5* 7.8* 8.2*  MG 1.8  --  1.6*   GFR: Estimated Creatinine Clearance: 46.6 mL/min (A) (by C-G formula based on SCr of 0.43 mg/dL (L)). Liver Function Tests: Recent Labs  Lab  03/22/19 1248 03/23/19 0814 03/24/19 0536  AST 78* 53* 45*  ALT 55* 38 31  ALKPHOS 169* 111 99  BILITOT 2.0* 1.7* 1.2  PROT 6.0* 5.2* 5.3*  ALBUMIN 3.4* 3.1* 3.1*   Recent Labs  Lab 03/22/19 1959  LIPASE 18   Recent Labs  Lab 03/22/19 1248  AMMONIA 21   Coagulation Profile: Recent Labs  Lab 03/22/19 1440 03/23/19 0814 03/24/19 0536  INR >10.0* 1.4* 1.1   Cardiac Enzymes: No results for input(s): CKTOTAL, CKMB, CKMBINDEX, TROPONINI in the last 168 hours. BNP (last 3 results) No results for input(s): PROBNP in the last 8760 hours. HbA1C: No results for input(s): HGBA1C in the last 72 hours. CBG: No results for input(s): GLUCAP in the last 168 hours. Lipid Profile: No results for input(s): CHOL, HDL, LDLCALC, TRIG, CHOLHDL, LDLDIRECT in the last 72 hours. Thyroid Function Tests: Recent Labs    03/22/19 1248  TSH 1.464   Anemia Panel: No results for input(s): VITAMINB12, FOLATE, FERRITIN, TIBC, IRON, RETICCTPCT in the last 72 hours. Sepsis  Labs: No results for input(s): PROCALCITON, LATICACIDVEN in the last 168 hours.  Recent Results (from the past 240 hour(s))  SARS CORONAVIRUS 2 (TAT 6-24 HRS) Nasopharyngeal Nasopharyngeal Swab     Status: None   Collection Time: 03/22/19  5:26 PM   Specimen: Nasopharyngeal Swab  Result Value Ref Range Status   SARS Coronavirus 2 NEGATIVE NEGATIVE Final    Comment: (NOTE) SARS-CoV-2 target nucleic acids are NOT DETECTED. The SARS-CoV-2 RNA is generally detectable in upper and lower respiratory specimens during the acute phase of infection. Negative results do not preclude SARS-CoV-2 infection, do not rule out co-infections with other pathogens, and should not be used as the sole basis for treatment or other patient management decisions. Negative results must be combined with clinical observations, patient history, and epidemiological information. The expected result is Negative. Fact Sheet for  Patients: SugarRoll.be Fact Sheet for Healthcare Providers: https://www.woods-mathews.com/ This test is not yet approved or cleared by the [place] FDA and  has been authorized for detection and/or diagnosis of SARS-CoV-2 by FDA under an Emergency Use Authorization (EUA). This EUA will remain  in effect (meaning this test can be used) for the duration of the COVID-19 declaration under Section 56 4(b)(1) of the Act, 21 U.S.C. section 360bbb-3(b)(1), unless the authorization is terminated or revoked sooner. Performed at [facility], [place] [address], [place], [place] [address]   MRSA PCR Screening     Status: None   Collection Time: 03/22/19 11:17 PM   Specimen: Nasal Mucosa; Nasopharyngeal  Result Value Ref Range Status   MRSA by PCR NEGATIVE NEGATIVE Final    Comment:        The GeneXpert MRSA Assay (FDA approved for NASAL specimens only), is one component of a comprehensive MRSA colonization surveillance program. It is not intended to diagnose MRSA infection nor to guide or monitor treatment for MRSA infections. Performed at [facility], [address] [place], [place] [address]          Radiology Studies: Ct Head Wo Contrast  Result Date: 03/22/2019 CLINICAL DATA:  Fall in bathroom striking back of head. EXAM: CT HEAD WITHOUT CONTRAST CT CERVICAL SPINE WITHOUT CONTRAST TECHNIQUE: Multidetector CT imaging of the head and cervical spine was performed following the standard protocol without intravenous contrast. Multiplanar CT image reconstructions of the cervical spine were also generated. COMPARISON:  None. FINDINGS: CT HEAD FINDINGS Brain: Ventricles and cisterns are within normal. There is mild age related atrophy as well as chronic ischemic microvascular disease. There is no mass, mass effect, shift of midline structures or acute hemorrhage. Suggestion of an old small left posterior watershed infarct. No acute infarction.  Vascular: No hyperdense vessel or unexpected calcification. Skull: Normal. Negative for fracture or focal lesion. Sinuses/Orbits: No acute finding. Other: None. CT CERVICAL SPINE FINDINGS Alignment: Normal. Skull base and vertebrae: Mild spondylosis of the cervical spine. Vertebral body heights are maintained. Atlantoaxial articulation is normal. Mild uncovertebral joint spurring. Mild left-sided neural from narrowing at the C5-6 level and right-sided neural foraminal narrowing at the C6-7 level. No acute fracture or subluxation. Soft tissues and spinal canal: No prevertebral fluid or swelling. No visible canal hematoma. Disc levels:  Mild disc space narrowing at the C5-6 and C6-7 levels. Upper chest: No acute findings. Mild calcified plaque over the aortic arch. Other: None. IMPRESSION: 1.  No acute brain injury. 2. Mild chronic ischemic microvascular disease and age related atrophy. 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 3.  No acute cervical spine  injury. 4. Mild spondylosis of the cervical spine with disc disease at the C5-6 and C6-7 levels and mild neural foraminal narrowing as described. 5.  Aortic Atherosclerosis (ICD10-I70.0). Electronically Signed   By: Marin Olp M.D.   On: 03/22/2019 15:41   Ct Cervical Spine Wo Contrast  Result Date: 03/22/2019 CLINICAL DATA:  Fall in bathroom striking back of head. EXAM: CT HEAD WITHOUT CONTRAST CT CERVICAL SPINE WITHOUT CONTRAST TECHNIQUE: Multidetector CT imaging of the head and cervical spine was performed following the standard protocol without intravenous contrast. Multiplanar CT image reconstructions of the cervical spine were also generated. COMPARISON:  None. FINDINGS: CT HEAD FINDINGS Brain: Ventricles and cisterns are within normal. There is mild age related atrophy as well as chronic ischemic microvascular disease. There is no mass, mass effect, shift of midline structures or acute hemorrhage. Suggestion of an old small left posterior watershed  infarct. No acute infarction. Vascular: No hyperdense vessel or unexpected calcification. Skull: Normal. Negative for fracture or focal lesion. Sinuses/Orbits: No acute finding. Other: None. CT CERVICAL SPINE FINDINGS Alignment: Normal. Skull base and vertebrae: Mild spondylosis of the cervical spine. Vertebral body heights are maintained. Atlantoaxial articulation is normal. Mild uncovertebral joint spurring. Mild left-sided neural from narrowing at the C5-6 level and right-sided neural foraminal narrowing at the C6-7 level. No acute fracture or subluxation. Soft tissues and spinal canal: No prevertebral fluid or swelling. No visible canal hematoma. Disc levels:  Mild disc space narrowing at the C5-6 and C6-7 levels. Upper chest: No acute findings. Mild calcified plaque over the aortic arch. Other: None. IMPRESSION: 1.  No acute brain injury. 2. Mild chronic ischemic microvascular disease and age related atrophy. 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 3.  No acute cervical spine injury. 4. Mild spondylosis of the cervical spine with disc disease at the C5-6 and C6-7 levels and mild neural foraminal narrowing as described. 5.  Aortic Atherosclerosis (ICD10-I70.0). Electronically Signed   By: Marin Olp M.D.   On: 03/22/2019 15:41   Ct Abdomen Pelvis W Contrast  Result Date: 03/22/2019 CLINICAL DATA:  85 year old female with history of trauma from a fall in the bathroom. Abdominal pain. Blood in stool. EXAM: CT ABDOMEN AND PELVIS WITH CONTRAST TECHNIQUE: Multidetector CT imaging of the abdomen and pelvis was performed using the standard protocol following bolus administration of intravenous contrast. CONTRAST:  32mL OMNIPAQUE IOHEXOL 300 MG/ML  SOLN COMPARISON:  None. FINDINGS: Lower chest: Aortic atherosclerosis. Mild cardiomegaly. Pacemaker lead extending in the right ventricle. Status post median sternotomy. Hepatobiliary: No suspicious cystic or solid hepatic lesions. Very mild intrahepatic biliary  ductal dilatation, particularly and segments 2 and 3 of the liver. Common bile duct is dilated measuring up to 12 mm in the porta hepatis. No definite calcified stone in the common bile duct. Gallbladder is moderately distended. No definite calcified gallstones. No gallbladder wall thickening or pericholecystic fluid or inflammatory changes. Pancreas: No pancreatic mass. No pancreatic ductal dilatation. No pancreatic or peripancreatic fluid collections or inflammatory changes. Spleen: Unremarkable. Adrenals/Urinary Tract: 1.3 cm low-attenuation lesion in the upper pole the right kidney, compatible with a simple cyst. Subcentimeter low-attenuation lesion in the anterior aspect of the interpolar region of the left kidney, too small to characterize, but statistically likely to represent a cyst. No hydroureteronephrosis. Urinary bladder is normal in appearance. Bilateral adrenal glands are normal in appearance. Stomach/Bowel: Normal appearance of the stomach. No pathologic dilatation of small bowel or colon. The appendix is not confidently identified and may be surgically absent.  Regardless, there are no inflammatory changes noted adjacent to the cecum to suggest the presence of an acute appendicitis at this time. Vascular/Lymphatic: Aortic atherosclerosis, without evidence of aneurysm or dissection in the abdominal or pelvic vasculature. No lymphadenopathy noted in the abdomen or pelvis. Reproductive: Status post hysterectomy.  Ovaries are a trophic. Other: No significant volume of ascites.  No pneumoperitoneum. Musculoskeletal: Chronic appearing compression fracture of superior endplate of L1 with [id number] loss of anterior vertebral body height. There are no aggressive appearing lytic or blastic lesions noted in the visualized portions of the skeleton. IMPRESSION: 1. No evidence of significant acute traumatic injury to the abdomen or pelvis. 2. Mild intra and extrahepatic biliary ductal dilatation. No choledocholithiasis.  No definite pancreatic head mass. These findings could simply be age related, however, if there is any biochemical evidence of biliary tract obstruction, further evaluation with abdominal MRI with and without IV gadolinium with MRCP should be considered to exclude the possibility of small ampullary neoplasm. 3. Cardiomegaly. 4. Aortic atherosclerosis. Electronically Signed   By: Vinnie Langton M.D.   On: 03/22/2019 15:41   Ct Abdomen W Wo Contrast  Result Date: 03/23/2019 CLINICAL DATA:  Abnormal liver function tests. EXAM: CT ABDOMEN WITHOUT AND WITH CONTRAST TECHNIQUE: Multidetector CT imaging of the abdomen was performed following the standard protocol before and following the bolus administration of intravenous contrast. CONTRAST:  150mL OMNIPAQUE IOHEXOL 300 MG/ML  SOLN COMPARISON:  01/20/2019 FINDINGS: Lower chest: Centrilobular emphsyema noted. Hepatobiliary: No hypervascular lesion in the liver parenchyma on arterial phase postcontrast imaging. Gallbladder is distended with intermediate attenuation material in the lumen, potentially sludge or vicarious excretion of contrast from yesterday's CT scan. Trace pericholecystic fluid noted. As noted yesterday, there is mild intrahepatic and marked extrahepatic biliary duct dilatation. Extrahepatic common duct measures 11 mm diameter today compared to 12 mm yesterday. Common bile duct in the head of the pancreas is 12 mm diameter. The bile in the common duct is low-density, approaching water attenuation at 11 Hounsfield units. This becomes gradually higher in attenuation towards the ampulla where average attenuation of luminal contents in the common bile duct at the head of pancreas are 49 Hounsfield units on postcontrast series 9, image 40. There is an abrupt cut off of the pancreatic duct at the ampulla. Pancreas: Pancreatic parenchyma is atrophic without dilatation of the main pancreatic duct. No pancreatic head mass evident. Spleen: No splenomegaly. No  focal mass lesion. Adrenals/Urinary Tract: No adrenal nodule or mass. Small cyst again noted upper pole right kidney. Left kidney unremarkable. Stomach/Bowel: Tiny hiatal hernia. Stomach otherwise unremarkable. Duodenum is normally positioned as is the ligament of Treitz. No small bowel or colonic dilatation within the visualized abdomen. Vascular/Lymphatic: There is abdominal aortic atherosclerosis without aneurysm. There is no gastrohepatic or hepatoduodenal ligament lymphadenopathy. No intraperitoneal or retroperitoneal lymphadenopathy. Other: No intraperitoneal free fluid. Musculoskeletal: No worrisome lytic or sclerotic osseous abnormality. Mild superior endplate compression deformity at L1. IMPRESSION: 1. Similar appearance of intra and extrahepatic biliary duct dilatation with common bile duct dilatation extending right to the ampulla. Intraluminal contents of the common bile duct in the head of pancreas just proximal to the ampulla are higher attenuation than in the common duct at the level of the porta hepatis. This could be related to intraluminal sludge, noncalcified stone, or hemorrhage. This region does not appear to substantially enhance after IV contrast administration making soft tissue lesion less likely but not entirely excluded. Presence of a permanent pacemaker likely precludes MRCP. ERCP may  be warranted to further evaluate given the abnormal attenuation in the distal common bile duct period. 2. Tiny hiatal hernia. 3.  Aortic Atherosclerois (ICD10-170.0) Electronically Signed   By: Misty Stanley M.D.   On: 03/23/2019 16:52   Dg Chest Portable 1 View  Result Date: 03/22/2019 CLINICAL DATA:  Pain after fall. EXAM: PORTABLE CHEST 1 VIEW COMPARISON:  None. FINDINGS: The heart size and mediastinal contours are within normal limits. Both lungs are clear. No pneumothorax or pleural effusion is noted. Sternotomy wires are noted. Left-sided pacemaker is unchanged in position. The visualized skeletal  structures are unremarkable. IMPRESSION: No active disease. Electronically Signed   By: Marijo Conception M.D.   On: 03/22/2019 12:46   Dg Humerus Left  Result Date: 03/22/2019 CLINICAL DATA:  Tripped on file, history of atrial fibrillation. EXAM: LEFT HUMERUS - 2+ VIEW COMPARISON:  None FINDINGS: There is no evidence of fracture or other focal bone lesions. Soft tissues are unremarkable. IMPRESSION: Negative. Electronically Signed   By: Zetta Bills M.D.   On: 03/22/2019 12:47        Scheduled Meds:  sodium chloride   Intravenous Once   Chlorhexidine Gluconate Cloth  6 each Topical Daily   DULoxetine  30 mg Oral Daily   feeding supplement  1 Container Oral TID BM   gabapentin  300 mg Oral QHS   levothyroxine  50 mcg Oral Q0600   metoprolol tartrate  100 mg Oral BID   pantoprazole (PROTONIX) IV  40 mg Intravenous Q12H   potassium chloride  40 mEq Oral BID   QUEtiapine  12.5 mg Oral Once   Continuous Infusions:  magnesium sulfate bolus IVPB       LOS: 2 days    Time spent: 30 minutes    Akshay Spang Darleen Crocker, DO [facility] Pager [phone]  If 7PM-7AM, please contact night-coverage [email or web address] Password [email or web address] 03/24/2019, 8:10 AM

## 2019-03-25 LAB — COMPREHENSIVE METABOLIC PANEL
ALT: 29 U/L (ref 0–44)
AST: 41 U/L (ref 15–41)
Albumin: 3.1 g/dL — ABNORMAL LOW (ref 3.5–5.0)
Alkaline Phosphatase: 99 U/L (ref 38–126)
Anion gap: 8 (ref 5–15)
BUN: 7 mg/dL — ABNORMAL LOW (ref 8–23)
CO2: 24 mmol/L (ref 22–32)
Calcium: 8.2 mg/dL — ABNORMAL LOW (ref 8.9–10.3)
Chloride: 108 mmol/L (ref 98–111)
Creatinine, Ser: 0.46 mg/dL (ref 0.44–1.00)
GFR calc Af Amer: >60 mL/min (ref >60)
GFR calc non Af Amer: >60 mL/min (ref >60)
Glucose, Bld: 92 mg/dL (ref 70–99)
Potassium: 3.6 mmol/L (ref 3.5–5.1)
Sodium: 140 mmol/L (ref 135–145)
Total Bilirubin: 1 mg/dL (ref 0.3–1.2)
Total Protein: 5.5 g/dL — ABNORMAL LOW (ref 6.5–8.1)

## 2019-03-25 LAB — TYPE AND SCREEN
ABO/RH(D): A POS
Antibody Screen: NEGATIVE
Unit division: 0
Unit division: 0

## 2019-03-25 LAB — BPAM RBC
Blood Product Expiration Date: 202010252359
Blood Product Expiration Date: 202011072359
ISSUE DATE / TIME: 202010090148
ISSUE DATE / TIME: 202010101115
Unit Type and Rh: 6200
Unit Type and Rh: 6200

## 2019-03-25 LAB — CBC
HCT: 30.1 % — ABNORMAL LOW (ref 36.0–46.0)
Hemoglobin: 10.2 g/dL — ABNORMAL LOW (ref 12.0–15.0)
MCH: 30.1 pg (ref 26.0–34.0)
MCHC: 33.9 g/dL (ref 30.0–36.0)
MCV: 88.8 fL (ref 80.0–100.0)
Platelets: 227 10*3/uL (ref 150–400)
RBC: 3.39 MIL/uL — ABNORMAL LOW (ref 3.87–5.11)
RDW: 15 % (ref 11.5–15.5)
WBC: 10.2 10*3/uL (ref 4.0–10.5)
nRBC: 0.8 % — ABNORMAL HIGH (ref 0.0–0.2)

## 2019-03-25 LAB — MAGNESIUM: Magnesium: 1.8 mg/dL (ref 1.7–2.4)

## 2019-03-25 LAB — PROTIME-INR
INR: 1.4 — ABNORMAL HIGH (ref 0.8–1.2)
Prothrombin Time: 16.5 seconds — ABNORMAL HIGH (ref 11.4–15.2)

## 2019-03-25 NOTE — Progress Notes (Signed)
Dark liquid incontinent stool in bed followed by ruby colored smears while on BSC.

## 2019-03-25 NOTE — Evaluation (Addendum)
Physical Therapy Evaluation Patient Details Name: Angelica Hernandez MRN: [id number] DOB: 01-24-1934 Today's Date: 03/25/2019   History of Present Illness  Angelica Hernandez is a 85 y.o. female with medical history significant for  Atria fib, pacemaker status, depression, ischemic cardiomyopathy, coronary artery disease.  Patient was brought to the ED via EMS with reports of a fall.  Patient is alone, she is able to answer questions.  She tells me she has had abdominal pain for 2 days now-Upper abdomen.  Patient reports she fell yesterday and today.  She reports dizziness when standing, which caused her fall yesterday.  Today she is unable to tell me the circumstances surrounding her fall, but per notes patient was straining to have a bowel movement when she fell.  She hit the back of her head when she fell.  She also reports a dry cough of 2 days, no difficulty breathing.  She denies chest pain.  She is unaware of rapid heart rate.Patient reports yesterday and today she had bowel movements that had small amount of blood.  She denies black stools.  No vomiting of blood.    Clinical Impression  Physical therapy evaluation completed, patient is at baseline and no further PT services recommended at this time. Pt ambulates throughout room and hallway without AD, no unsteadiness noted. Patient discharged to care of nursing for ambulation daily as tolerated for length of stay.     Follow Up Recommendations No PT follow up    Equipment Recommendations  None recommended by PT    Recommendations for Other Services       Precautions / Restrictions Precautions Precautions: None Restrictions Weight Bearing Restrictions: No      Mobility  Bed Mobility Overal bed mobility: Modified Independent             General bed mobility comments: increased time  Transfers Overall transfer level: Modified independent Equipment used: Rolling walker (2 wheeled);None             General transfer  comment: steadiness with and without RW, no loss of balance  Ambulation/Gait Ambulation/Gait assistance: Modified independent (Device/Increase time) Gait Distance (Feet): 100 Feet Assistive device: None Gait Pattern/deviations: WFL(Within Functional Limits) Gait velocity: slightly decreased   General Gait Details: pt ambulates around room environment clearing past obstacles safely, ambulates in hallway performing 180 degree turns, no unsteadiness noted  Stairs            Wheelchair Mobility    Modified Rankin (Stroke Patients Only)       Balance Overall balance assessment: No apparent balance deficits (not formally assessed)                                           Pertinent Vitals/Pain Pain Assessment: No/denies pain    Home Living Family/patient expects to be discharged to:: Private residence Living Arrangements: Children(Daughter) Available Help at Discharge: Family;Available 24 hours/day Type of Home: House Home Access: Level entry     Home Layout: Two level;Laundry or work area in [place]: Kasandra Knudsen - single point Additional Comments: Pt reports can enter through basement or level entry at kitchen    Prior Function Level of Independence: Independent with assistive device(s)         Comments: Pt reports Ind with community and household ambulation using SPC, daughter provides cleaning, cooking, and driving assistnace     Hand Dominance  Extremity/Trunk Assessment   Upper Extremity Assessment Upper Extremity Assessment: Overall WFL for tasks assessed    Lower Extremity Assessment Lower Extremity Assessment: Overall WFL for tasks assessed    Cervical / Trunk Assessment Cervical / Trunk Assessment: Normal  Communication   Communication: No difficulties  Cognition Arousal/Alertness: Awake/alert Behavior During Therapy: WFL for tasks assessed/performed Overall Cognitive Status: Within Functional Limits for  tasks assessed                                 General Comments: Patient oriented to place, location, self, month and year      General Comments      Exercises     Assessment/Plan    PT Assessment Patent does not need any further PT services  PT Problem List Decreased activity tolerance       PT Treatment Interventions      PT Goals (Current goals can be found in the Care Plan section)  Acute Rehab PT Goals Patient Stated Goal: return home with daughter PT Goal Formulation: With patient Time For Goal Achievement: 03/25/19 Potential to Achieve Goals: Good    Frequency     Barriers to discharge        Co-evaluation               AM-PAC PT "6 Clicks" Mobility  Outcome Measure Help needed turning from your back to your side while in a flat bed without using bedrails?: None Help needed moving from lying on your back to sitting on the side of a flat bed without using bedrails?: None Help needed moving to and from a bed to a chair (including a wheelchair)?: None Help needed standing up from a chair using your arms (e.g., wheelchair or bedside chair)?: None Help needed to walk in hospital room?: None Help needed climbing 3-5 steps with a railing? : None 6 Click Score: 24    End of Session Equipment Utilized During Treatment: Gait belt Activity Tolerance: Patient tolerated treatment well Patient left: in chair;with call bell/phone within reach Nurse Communication: Mobility status PT Visit Diagnosis: Other abnormalities of gait and mobility (R26.89)    Time: 1030-1050 PT Time Calculation (min) (ACUTE ONLY): 20 min   Charges:   PT Evaluation $PT Eval Moderate Complexity: 1 Mod PT Treatments $Gait Training: 8-22 mins        Tori Nazire Fruth PT, DPT 03/25/19, 11:03 AM [phone]

## 2019-03-25 NOTE — Progress Notes (Signed)
Earlier in the shift patient was congenial and obliging. Patient now agreeable to take only her Cardiac related medications (as scanned), upon giving patient her medications in a medicine cup and water for administration, she threw the medication cup across the room and stated "these are the wrong pills" .. she is now not agreeable to any medications.  She has attempted to stand and walk around unaided without using the call bell twice at this time.  She is resting at this time, in bed, the call bell is in reach and bed alarm is on.

## 2019-03-25 NOTE — Progress Notes (Signed)
PROGRESS NOTE    Angelica Hernandez  [id number] DOB: 07-04-1933 DOA: 03/22/2019 PCP: Caryl Bis, MD   Brief Narrative:  Per HPI: Angelica Hernandez a 85 y.o.femalewith medical history significant forAtria fib,pacemaker status, depression, ischemic cardiomyopathy, coronary artery disease.Patient was brought to the ED via EMS with reports of a fall. Patient is alone, she is able to answer questions.She tells me she has had abdominal pain for 2 days now-Upper abdomen. Patient reports she fell yesterday and today.She reports dizziness when standing,which causedherfall yesterday. Today she is unable to tell me the circumstances surrounding her fall, but per notes patient was straining to have a bowel movement when she fell. She hit the back of her head when she fell. She also reports a drycough of 2 days,no difficulty breathing. She denies chest pain. She is unaware of rapid heart rate. Patient reports yesterday and today she had bowel movements that had small amount of blood. She denies black stools. No vomiting of blood.  10/9:Patient was admitted with acute symptomatic blood loss anemia and had just received 1 unit PRBCs, but will likely need more. Heart rates demonstrate improved control at this time, while on Cardizem drip.  10/10: Patient still has hemoglobin of 7 and will require 1 more unit of PRBC transfusion.  LFTs are downtrending.  INR is now 1.1.  No further overt bleeding noted overnight.  Low potassium and magnesium noted which will be repleted.  Heart rates are controlled.  GI planning for likely EGD soon.  10/11: Patient's hemoglobin this morning 10.2 and INR 1.4.  Magnesium and potassium repleted.  She still had some dark stool overnight.  Plans are for EGD and colonoscopy on 10/12.  No agitation noted overnight.  She is stable for transfer to telemetry.  Assessment & Plan:   Active Problems:   Cardiac pacemaker in situ   Atrial fibrillation (HCC)    Supratherapeutic INR   Acute anemia   CAD (coronary artery disease)   Atrial fibrillation with RVR (HCC)   Gastrointestinal hemorrhage   Hypercoagulopathy (HCC)   Dilation of biliary tract   Elevated LFTs   Acute symptomatic blood loss anemia-improved status post 2 unit PRBC transfusion -Continue on IV Protonix twice daily for now -Continue clear liquid diet and plan for EGD/colonoscopy on 10/12 -She has received 1 unit PRBC on 10/9 -1 more unit PRBC transfusion on 10/10 given hemoglobin of 7  Atrial fibrillation with RVR and supratherapeutic INR-improved -Holding home warfarin until further GI evaluation -Vitamin K 5 mg IV given in ED and INR is 1.1 on 10/10 -Cardizem drip discontinued on 10/10 -Continue home metoprolol with increased dose to 100 mg every 12 hours -Patient is status post pacemaker interrogation with no evidence of lethal arrhythmias -Hold benazepril -TSH 1.4 -Okay for transfer to telemetry  Ischemic cardiomyopathy -History of three-vessel CABG in 1970 -No chest pain currently noted -High-sensitivity troponin noted to be 8 -Hold home atorvastatin due to transaminitis -Hold benazepril for now  Abdominal pain with mild transaminitis-downtrending -CT of the abdomen and pelvis with concern for biliary ductal dilatation even on repeat 10/9 -GI to evaluate for EGD, could not perform MRCP on account of pacemaker -Lipase 18 -Repeat labs ordered for a.m.  Multiple falls -Likely secondary to acute anemia and A. fib with RVR -Head and cervical CT negative for acute abnormalities -PT evaluation once stabilized  Hypomagnesemia/hypokalemia -Resolved -Recheck in a.m.  Agitation/delirium-improved -Ativan ordered as needed   DVT prophylaxis:SCDs Code Status:Full code Family Communication:We will plan to update  family Disposition Plan: Plans for EGD/colonoscopy in a.m. per GI.  Maintain on clear liquids for now.  Transfer to telemetry.  PT  evaluation.   Consultants:  GI  Procedures:  None  Antimicrobials:   None  Subjective: Patient seen and evaluated today with no new acute complaints or concerns. No acute concerns or events noted overnight.  She did have some dark liquidy stool overnight x1.  Heart rates have remained controlled as well as blood pressures.  No agitation noted overnight.  Objective: Vitals:   03/25/19 0500 03/25/19 0600 03/25/19 0700 03/25/19 0740  BP: 128/69 136/76 134/86   Pulse: 69 87 (!) 41 89  Resp: 17 (!) 28 15 (!) 25  Temp:    97.7 F (36.5 C)  TempSrc:    Oral  SpO2: 94% 97% 99% 98%  Weight: 55.6 kg     Height:        Intake/Output Summary (Last 24 hours) at 03/25/2019 0743 Last data filed at 03/25/2019 0650 Gross per 24 hour  Intake 400 ml  Output 1850 ml  Net -1450 ml   Filed Weights   03/23/19 0500 03/24/19 0435 03/25/19 0500  Weight: 54.5 kg 54.5 kg 55.6 kg    Examination:  General exam: Appears calm and comfortable  Respiratory system: Clear to auscultation. Respiratory effort normal. Cardiovascular system: S1 & S2 heard, irregular. No JVD, murmurs, rubs, gallops or clicks. No pedal edema. Gastrointestinal system: Abdomen is nondistended, soft and nontender. No organomegaly or masses felt. Normal bowel sounds heard. Central nervous system: Alert and oriented. No focal neurological deficits. Extremities: Symmetric 5 x 5 power. Skin: No rashes, lesions or ulcers Psychiatry: Judgement and insight appear normal. Mood & affect appropriate.     Data Reviewed: I have personally reviewed following labs and imaging studies  CBC: Recent Labs  Lab 03/22/19 1248 03/22/19 2013 03/23/19 0017 03/23/19 0814 03/24/19 0536 03/25/19 0447  WBC 14.5*  --  10.8* 10.9* 11.6* 10.2  NEUTROABS 11.1*  --   --   --   --   --   HGB 8.3* 6.5* 5.7* 7.5* 7.0* 10.2*  HCT 26.4* 19.0* 17.1* 23.4* 21.1* 30.1*  MCV 96.7  --  90.0 92.9 90.6 88.8  PLT 224  --  155 183 195 [id number]    Basic Metabolic Panel: Recent Labs  Lab 03/22/19 1248 03/23/19 0814 03/24/19 0536 03/25/19 0447  NA 141 139 139 140  K 3.9 3.6 2.9* 3.6  CL 108 111 109 108  CO2 21* 20* 23 24  GLUCOSE 127* 110* 113* 92  BUN 23 23 9  7*  CREATININE 0.61 0.67 0.43* 0.46  CALCIUM 8.5* 7.8* 8.2* 8.2*  MG 1.8  --  1.6* 1.8   GFR: Estimated Creatinine Clearance: 47.6 mL/min (by C-G formula based on SCr of 0.46 mg/dL). Liver Function Tests: Recent Labs  Lab 03/22/19 1248 03/23/19 0814 03/24/19 0536 03/25/19 0447  AST 78* 53* 45* 41  ALT 55* 38 31 29  ALKPHOS 169* 111 99 99  BILITOT 2.0* 1.7* 1.2 1.0  PROT 6.0* 5.2* 5.3* 5.5*  ALBUMIN 3.4* 3.1* 3.1* 3.1*   Recent Labs  Lab 03/22/19 1959  LIPASE 18   Recent Labs  Lab 03/22/19 1248  AMMONIA 21   Coagulation Profile: Recent Labs  Lab 03/22/19 1440 03/23/19 0814 03/24/19 0536 03/25/19 0447  INR >10.0* 1.4* 1.1 1.4*   Cardiac Enzymes: No results for input(s): CKTOTAL, CKMB, CKMBINDEX, TROPONINI in the last 168 hours. BNP (last 3 results) No results for  input(s): PROBNP in the last 8760 hours. HbA1C: No results for input(s): HGBA1C in the last 72 hours. CBG: No results for input(s): GLUCAP in the last 168 hours. Lipid Profile: No results for input(s): CHOL, HDL, LDLCALC, TRIG, CHOLHDL, LDLDIRECT in the last 72 hours. Thyroid Function Tests: Recent Labs    03/22/19 1248  TSH 1.464   Anemia Panel: No results for input(s): VITAMINB12, FOLATE, FERRITIN, TIBC, IRON, RETICCTPCT in the last 72 hours. Sepsis Labs: No results for input(s): PROCALCITON, LATICACIDVEN in the last 168 hours.  Recent Results (from the past 240 hour(s))  SARS CORONAVIRUS 2 (TAT 6-24 HRS) Nasopharyngeal Nasopharyngeal Swab     Status: None   Collection Time: 03/22/19  5:26 PM   Specimen: Nasopharyngeal Swab  Result Value Ref Range Status   SARS Coronavirus 2 NEGATIVE NEGATIVE Final    Comment: (NOTE) SARS-CoV-2 target nucleic acids are NOT DETECTED.  The SARS-CoV-2 RNA is generally detectable in upper and lower respiratory specimens during the acute phase of infection. Negative results do not preclude SARS-CoV-2 infection, do not rule out co-infections with other pathogens, and should not be used as the sole basis for treatment or other patient management decisions. Negative results must be combined with clinical observations, patient history, and epidemiological information. The expected result is Negative. Fact Sheet for Patients: SugarRoll.be Fact Sheet for Healthcare Providers: https://www.woods-mathews.com/ This test is not yet approved or cleared by the [place] FDA and  has been authorized for detection and/or diagnosis of SARS-CoV-2 by FDA under an Emergency Use Authorization (EUA). This EUA will remain  in effect (meaning this test can be used) for the duration of the COVID-19 declaration under Section 56 4(b)(1) of the Act, 21 U.S.C. section 360bbb-3(b)(1), unless the authorization is terminated or revoked sooner. Performed at [facility], [place] [address] [place], [place] [address]   MRSA PCR Screening     Status: None   Collection Time: 03/22/19 11:17 PM   Specimen: Nasal Mucosa; Nasopharyngeal  Result Value Ref Range Status   MRSA by PCR NEGATIVE NEGATIVE Final    Comment:        The GeneXpert MRSA Assay (FDA approved for NASAL specimens only), is one component of a comprehensive MRSA colonization surveillance program. It is not intended to diagnose MRSA infection nor to guide or monitor treatment for MRSA infections. Performed at [facility], [address], [place], [place] [address]          Radiology Studies: Ct Abdomen W Wo Contrast  Result Date: 03/23/2019 CLINICAL DATA:  Abnormal liver function tests. EXAM: CT ABDOMEN WITHOUT AND WITH CONTRAST TECHNIQUE: Multidetector CT imaging of the abdomen was performed following the standard protocol  before and following the bolus administration of intravenous contrast. CONTRAST:  140mL OMNIPAQUE IOHEXOL 300 MG/ML  SOLN COMPARISON:  01/20/2019 FINDINGS: Lower chest: Centrilobular emphsyema noted. Hepatobiliary: No hypervascular lesion in the liver parenchyma on arterial phase postcontrast imaging. Gallbladder is distended with intermediate attenuation material in the lumen, potentially sludge or vicarious excretion of contrast from yesterday's CT scan. Trace pericholecystic fluid noted. As noted yesterday, there is mild intrahepatic and marked extrahepatic biliary duct dilatation. Extrahepatic common duct measures 11 mm diameter today compared to 12 mm yesterday. Common bile duct in the head of the pancreas is 12 mm diameter. The bile in the common duct is low-density, approaching water attenuation at 11 Hounsfield units. This becomes gradually higher in attenuation towards the ampulla where average attenuation of luminal contents in the common bile  duct at the head of pancreas are 49 Hounsfield units on postcontrast series 9, image 40. There is an abrupt cut off of the pancreatic duct at the ampulla. Pancreas: Pancreatic parenchyma is atrophic without dilatation of the main pancreatic duct. No pancreatic head mass evident. Spleen: No splenomegaly. No focal mass lesion. Adrenals/Urinary Tract: No adrenal nodule or mass. Small cyst again noted upper pole right kidney. Left kidney unremarkable. Stomach/Bowel: Tiny hiatal hernia. Stomach otherwise unremarkable. Duodenum is normally positioned as is the ligament of Treitz. No small bowel or colonic dilatation within the visualized abdomen. Vascular/Lymphatic: There is abdominal aortic atherosclerosis without aneurysm. There is no gastrohepatic or hepatoduodenal ligament lymphadenopathy. No intraperitoneal or retroperitoneal lymphadenopathy. Other: No intraperitoneal free fluid. Musculoskeletal: No worrisome lytic or sclerotic osseous abnormality. Mild superior  endplate compression deformity at L1. IMPRESSION: 1. Similar appearance of intra and extrahepatic biliary duct dilatation with common bile duct dilatation extending right to the ampulla. Intraluminal contents of the common bile duct in the head of pancreas just proximal to the ampulla are higher attenuation than in the common duct at the level of the porta hepatis. This could be related to intraluminal sludge, noncalcified stone, or hemorrhage. This region does not appear to substantially enhance after IV contrast administration making soft tissue lesion less likely but not entirely excluded. Presence of a permanent pacemaker likely precludes MRCP. ERCP may be warranted to further evaluate given the abnormal attenuation in the distal common bile duct period. 2. Tiny hiatal hernia. 3.  Aortic Atherosclerois (ICD10-170.0) Electronically Signed   By: Misty Stanley M.D.   On: 03/23/2019 16:52        Scheduled Meds: . Chlorhexidine Gluconate Cloth  6 each Topical Daily  . DULoxetine  30 mg Oral Daily  . feeding supplement  1 Container Oral TID BM  . gabapentin  300 mg Oral QHS  . levothyroxine  50 mcg Oral Q0600  . metoprolol tartrate  100 mg Oral BID  . pantoprazole (PROTONIX) IV  40 mg Intravenous Q12H  . polyethylene glycol-electrolytes  4,000 mL Oral Once  . QUEtiapine  12.5 mg Oral Once   Continuous Infusions:   LOS: 3 days    Time spent: 30 minutes    Chazz Philson Darleen Crocker, DO [facility] Pager [phone]  If 7PM-7AM, please contact night-coverage [email or web address] Password [facility] 03/25/2019, 7:43 AM

## 2019-03-26 ENCOUNTER — Inpatient Hospital Stay (HOSPITAL_COMMUNITY): Payer: Medicare Other | Admitting: Anesthesiology

## 2019-03-26 ENCOUNTER — Encounter (HOSPITAL_COMMUNITY)
Admission: EM | Disposition: A | Discharge status: Home / Self Care | Payer: Self-pay | Source: Home / Self Care | Attending: Internal Medicine

## 2019-03-26 ENCOUNTER — Encounter (HOSPITAL_COMMUNITY): Payer: Self-pay | Admitting: Anesthesiology

## 2019-03-26 DIAGNOSIS — K31819 Angiodysplasia of stomach and duodenum without bleeding: Secondary | ICD-10-CM

## 2019-03-26 DIAGNOSIS — K222 Esophageal obstruction: Secondary | ICD-10-CM

## 2019-03-26 DIAGNOSIS — K921 Melena: Secondary | ICD-10-CM

## 2019-03-26 DIAGNOSIS — K259 Gastric ulcer, unspecified as acute or chronic, without hemorrhage or perforation: Secondary | ICD-10-CM

## 2019-03-26 HISTORY — PX: COLONOSCOPY: SHX5424

## 2019-03-26 HISTORY — PX: ESOPHAGOGASTRODUODENOSCOPY (EGD) WITH PROPOFOL: SHX5813

## 2019-03-26 LAB — CBC
HCT: 30.1 % — ABNORMAL LOW (ref 36.0–46.0)
Hemoglobin: 10 g/dL — ABNORMAL LOW (ref 12.0–15.0)
MCH: 29.8 pg (ref 26.0–34.0)
MCHC: 33.2 g/dL (ref 30.0–36.0)
MCV: 89.6 fL (ref 80.0–100.0)
Platelets: 238 10*3/uL (ref 150–400)
RBC: 3.36 MIL/uL — ABNORMAL LOW (ref 3.87–5.11)
RDW: 15.1 % (ref 11.5–15.5)
WBC: 7 10*3/uL (ref 4.0–10.5)
nRBC: 0.3 % — ABNORMAL HIGH (ref 0.0–0.2)

## 2019-03-26 LAB — COMPREHENSIVE METABOLIC PANEL
ALT: 28 U/L (ref 0–44)
AST: 38 U/L (ref 15–41)
Albumin: 3.1 g/dL — ABNORMAL LOW (ref 3.5–5.0)
Alkaline Phosphatase: 101 U/L (ref 38–126)
Anion gap: 8 (ref 5–15)
BUN: <5 mg/dL — ABNORMAL LOW (ref 8–23)
CO2: 26 mmol/L (ref 22–32)
Calcium: 8.4 mg/dL — ABNORMAL LOW (ref 8.9–10.3)
Chloride: 106 mmol/L (ref 98–111)
Creatinine, Ser: 0.51 mg/dL (ref 0.44–1.00)
GFR calc Af Amer: >60 mL/min (ref >60)
GFR calc non Af Amer: >60 mL/min (ref >60)
Glucose, Bld: 102 mg/dL — ABNORMAL HIGH (ref 70–99)
Potassium: 3.3 mmol/L — ABNORMAL LOW (ref 3.5–5.1)
Sodium: 140 mmol/L (ref 135–145)
Total Bilirubin: 1.3 mg/dL — ABNORMAL HIGH (ref 0.3–1.2)
Total Protein: 5.5 g/dL — ABNORMAL LOW (ref 6.5–8.1)

## 2019-03-26 LAB — MAGNESIUM: Magnesium: 1.7 mg/dL (ref 1.7–2.4)

## 2019-03-26 LAB — PROTIME-INR
INR: 1.4 — ABNORMAL HIGH (ref 0.8–1.2)
Prothrombin Time: 16.8 seconds — ABNORMAL HIGH (ref 11.4–15.2)

## 2019-03-26 SURGERY — ESOPHAGOGASTRODUODENOSCOPY (EGD) WITH PROPOFOL
Anesthesia: General

## 2019-03-26 MED ORDER — GABAPENTIN 300 MG PO CAPS: 300.0000 mg | ORAL_CAPSULE | Freq: Every day | ORAL | Status: DC

## 2019-03-26 MED ORDER — POTASSIUM CHLORIDE 20 MEQ/15ML (10%) PO SOLN: 40.0000 meq | Freq: Once | ORAL | Status: DC

## 2019-03-26 MED ORDER — PROMETHAZINE HCL 25 MG/ML IJ SOLN: 6.2500 mg | INTRAMUSCULAR | Status: DC | PRN

## 2019-03-26 MED ORDER — LEVOTHYROXINE SODIUM 50 MCG PO TABS
50.0000 ug | ORAL_TABLET | Freq: Every day | ORAL | Status: DC
  Administered 2019-03-27: 06:00:00 50 ug via ORAL
  Filled 2019-03-26: qty 1

## 2019-03-26 MED ORDER — SODIUM CHLORIDE 0.9 % IV SOLN: INTRAVENOUS | Status: DC

## 2019-03-26 MED ORDER — LIDOCAINE 2% (20 MG/ML) 5 ML SYRINGE
INTRAMUSCULAR | Status: AC
  Filled 2019-03-26: qty 15

## 2019-03-26 MED ORDER — PROPOFOL 500 MG/50ML IV EMUL
INTRAVENOUS | Status: DC | PRN
  Administered 2019-03-26: 150 ug/kg/min via INTRAVENOUS

## 2019-03-26 MED ORDER — LACTATED RINGERS IV SOLN: INTRAVENOUS | Status: DC

## 2019-03-26 MED ORDER — LIDOCAINE HCL 1 % IJ SOLN
INTRAMUSCULAR | Status: DC | PRN
  Administered 2019-03-26: 40 mg via INTRADERMAL

## 2019-03-26 MED ORDER — ATORVASTATIN CALCIUM 10 MG PO TABS
10.0000 mg | ORAL_TABLET | Freq: Every day | ORAL | Status: DC
  Administered 2019-03-26 – 2019-03-27 (×2): 10 mg via ORAL
  Filled 2019-03-26 (×2): qty 1

## 2019-03-26 MED ORDER — PROPOFOL 10 MG/ML IV BOLUS
INTRAVENOUS | Status: DC | PRN
  Administered 2019-03-26: 30 mg via INTRAVENOUS

## 2019-03-26 MED ORDER — POTASSIUM CHLORIDE 10 MEQ/100ML IV SOLN
10.0000 meq | INTRAVENOUS | Status: AC
  Administered 2019-03-26 (×2): 10 meq via INTRAVENOUS
  Filled 2019-03-26 (×2): qty 100

## 2019-03-26 MED ORDER — KETAMINE HCL 50 MG/5ML IJ SOSY
PREFILLED_SYRINGE | INTRAMUSCULAR | Status: AC
  Filled 2019-03-26: qty 5

## 2019-03-26 MED ORDER — DULOXETINE HCL 30 MG PO CPEP: 30.0000 mg | ORAL_CAPSULE | Freq: Every day | ORAL | Status: DC

## 2019-03-26 MED ORDER — HYDROCODONE-ACETAMINOPHEN 7.5-325 MG PO TABS: 1.0000 | ORAL_TABLET | Freq: Once | ORAL | Status: DC | PRN

## 2019-03-26 MED ORDER — LACTATED RINGERS IV SOLN
INTRAVENOUS | Status: DC | PRN
  Administered 2019-03-26: 15:00:00 via INTRAVENOUS

## 2019-03-26 MED ORDER — HYDROMORPHONE HCL 1 MG/ML IJ SOLN: 0.2500 mg | INTRAMUSCULAR | Status: DC | PRN

## 2019-03-26 NOTE — Interval H&P Note (Signed)
History and Physical Interval Note:  03/26/2019 3:16 PM  Angelica Hernandez  has presented today for surgery, with the diagnosis of GI bleed.  The various methods of treatment have been discussed with the patient and family. After consideration of risks, benefits and other options for treatment, the patient has consented to  Procedure(s): ESOPHAGOGASTRODUODENOSCOPY (EGD) WITH PROPOFOL (N/A) COLONOSCOPY (N/A) as a surgical intervention.  The patient's history has been reviewed, patient examined, no change in status, stable for surgery.  I have reviewed the patient's chart and labs.  Questions were answered to the patient's satisfaction.     Angelica Hernandez  Seen in short stay.  Hemoglobin 10 this morning INR 1.4.  EGD and colonoscopy this afternoon per plan.  The risks, benefits, limitations, imponderables and alternatives regarding both EGD and colonoscopy have been reviewed with the patient. Questions have been answered. All parties agreeable.

## 2019-03-26 NOTE — Care Management Important Message (Signed)
Important Message  Patient Details  Name: Angelica Hernandez MRN: [id number] Date of Birth: [birth year]   Medicare Important Message Given:  Yes     Tommy Medal 03/26/2019, 3:23 PM

## 2019-03-26 NOTE — Progress Notes (Signed)
Patient completed approximately 3/4 of Golytely. Spoke with nursing staff. Output is clear. NPO. Nursing staff obtaining consents this morning. Potassium mildly low at 3.3, replacement ordered. Patient aware of plans for colonoscopy/EGD today. Aware of risks and benefits.

## 2019-03-26 NOTE — H&P (View-Only) (Signed)
Patient completed approximately 3/4 of Golytely. Spoke with nursing staff. Output is clear. NPO. Nursing staff obtaining consents this morning. Potassium mildly low at 3.3, replacement ordered. Patient aware of plans for colonoscopy/EGD today. Aware of risks and benefits.

## 2019-03-26 NOTE — Op Note (Signed)
[facility] Patient Name: Angelica Hernandez Procedure Date: 03/26/2019 3:45 PM MRN: [id number] Date of Birth: [birth year] Attending MD: Norvel Richards , MD CSN: [id number] Age: 85 Admit Type: Inpatient Procedure:                Colonoscopy Indications:              Hematochezia Providers:                Norvel Richards, MD, Janeece Riggers, RN, Aram Candela Referring MD:              Medicines:                Propofol per Anesthesia Complications:            No immediate complications. Estimated Blood Loss:     Estimated blood loss was minimal. Estimated blood                            loss was minimal. Procedure:                Pre-Anesthesia Assessment:                           - Prior to the procedure, a History and Physical                            was performed, and patient medications and                            allergies were reviewed. The patient's tolerance of                            previous anesthesia was also reviewed. The risks                            and benefits of the procedure and the sedation                            options and risks were discussed with the patient.                            All questions were answered, and informed consent                            was obtained. Prior Anticoagulants: The patient has                            taken no previous anticoagulant or antiplatelet                            agents. ASA Grade Assessment: IV - A patient with                            severe systemic disease  that is a constant threat                            to life. After reviewing the risks and benefits,                            the patient was deemed in satisfactory condition to                            undergo the procedure.                           After obtaining informed consent, the colonoscope                            was passed under direct vision. Throughout the                             procedure, the patient's blood pressure, pulse, and                            oxygen saturations were monitored continuously. The                            [id number] scope was introduced through                            the anus and advanced to the the cecum, identified                            by appendiceal orifice and ileocecal valve. The                            colonoscopy was performed without difficulty. The                            patient tolerated the procedure well. The quality                            of the bowel preparation was adequate. The                            ileocecal valve, appendiceal orifice, and rectum                            were photographed. The entire colon was well                            visualized. Scope In: 3:50:38 PM Scope Out: 4:03:25 PM Scope Withdrawal Time: 0 hours 6 minutes 46 seconds  Total Procedure Duration: 0 hours 12 minutes 47 seconds  Findings:      The perianal and digital rectal examinations were normal.      Scattered medium-mouthed diverticula were found in the sigmoid colon and  descending colon. Old clot drilling in the sigmoid segment without any       suspect bleeding lesion seen.      The exam was otherwise without abnormality on direct and retroflexion       views. Impression:               - Diverticulosis in the sigmoid colon and in the                            descending colon. I suspect a recent diverticular                            bleed in the setting of hypercoagulability                           - The examination was otherwise normal on direct                            and retroflexion views.                           - No specimens collected. Moderate Sedation:      Moderate (conscious) sedation was personally administered by an       anesthesia professional. The following parameters were monitored: oxygen       saturation, heart rate, blood pressure, and response to  care. Recommendation:           - Return patient to ICU for ongoing care.                           - Advance diet as tolerated.                           - Continue present medications.                           - No repeat colonoscopy due to age. See EGD report.                            Outpatient evaluation of mildly elevated                            LFTs/dilated biliary tree via endoscopic                            ultrasound. If it is still felt that the benefits                            of ongoing anticoagulation therapy outweigh the                            risks, then Coumadin can be resumed on October 15                            with tight control of INR.                           -  Return to GI clinic (date not yet determined). Procedure Code(s):        --- Professional ---                           [phone], Colonoscopy, flexible; diagnostic, including                            collection of specimen(s) by brushing or washing,                            when performed (separate procedure) Diagnosis Code(s):        --- Professional ---                           K92.1, Melena (includes Hematochezia)                           K57.30, Diverticulosis of large intestine without                            perforation or abscess without bleeding CPT copyright 2019 American Medical Association. All rights reserved. The codes documented in this report are preliminary and upon coder review may  be revised to meet current compliance requirements. [name]. Sandip Power, MD Norvel Richards, MD 03/26/2019 4:14:19 PM This report has been signed electronically. Number of Addenda: 0

## 2019-03-26 NOTE — Anesthesia Preprocedure Evaluation (Signed)
Anesthesia Evaluation  Patient identified by MRN, date of birth, ID band Patient awake    Reviewed: Allergy & Precautions, NPO status , Patient's Chart, lab work & pertinent test results  Airway Mallampati: II  TM Distance: >3 FB Neck ROM: Full    Dental no notable dental hx.    Pulmonary neg pulmonary ROS, Current Smoker and Patient abstained from smoking.,    Pulmonary exam normal breath sounds clear to auscultation       Cardiovascular Exercise Tolerance: Poor + CAD  Normal cardiovascular exam+ dysrhythmias Atrial Fibrillation + pacemaker II Rhythm:Regular Rate:Normal  Pacer placed in 2019 -states for a fib  Denies MI or CP   Neuro/Psych PSYCHIATRIC DISORDERS Anxiety Depression negative neurological ROS     GI/Hepatic Neg liver ROS, GERD  Medicated and Controlled,  Endo/Other  Hypothyroidism   Renal/GU Renal InsufficiencyRenal disease  negative genitourinary   Musculoskeletal negative musculoskeletal ROS (+)   Abdominal   Peds negative pediatric ROS (+)  Hematology negative hematology ROS (+) anemia , Severe anemia -tx'd -last ~10/30   Anesthesia Other Findings   Reproductive/Obstetrics negative OB ROS                             Anesthesia Physical Anesthesia Plan  ASA: IV and emergent  Anesthesia Plan: General   Post-op Pain Management:    Induction: Intravenous  PONV Risk Score and Plan: 2 and TIVA, Propofol infusion, Ondansetron and Treatment may vary due to age or medical condition  Airway Management Planned: Nasal Cannula and Simple Face Mask  Additional Equipment:   Intra-op Plan:   Post-operative Plan:   Informed Consent: I have reviewed the patients History and Physical, chart, labs and discussed the procedure including the risks, benefits and alternatives for the proposed anesthesia with the patient or authorized representative who has indicated his/her  understanding and acceptance.     Dental advisory given  Plan Discussed with: CRNA  Anesthesia Plan Comments: (Plan Full PPE use  Plan GA with GETA as needed d/w pt -WTP with same after Q&A  D/w pt -if needed ETT- may require post procedure ventilation -voiced understanding WTP)        Anesthesia Quick Evaluation

## 2019-03-26 NOTE — Progress Notes (Signed)
PROGRESS NOTE    Angelica Hernandez  [id number] DOB: 09-22-1933 DOA: 03/22/2019 PCP: Caryl Bis, MD   Brief Narrative:  Per HPI: Angelica Hernandez a 85 y.o.femalewith medical history significant forAtria fib,pacemaker status, depression, ischemic cardiomyopathy, coronary artery disease.Patient was brought to the ED via EMS with reports of a fall. Patient is alone, she is able to answer questions.She tells me she has had abdominal pain for 2 days now-Upper abdomen. Patient reports she fell yesterday and today.She reports dizziness when standing,which causedherfall yesterday. Today she is unable to tell me the circumstances surrounding her fall, but per notes patient was straining to have a bowel movement when she fell. She hit the back of her head when she fell. She also reports a drycough of 2 days,no difficulty breathing. She denies chest pain. She is unaware of rapid heart rate. Patient reports yesterday and today she had bowel movements that had small amount of blood. She denies black stools. No vomiting of blood.  10/9:Patient was admitted with acute symptomatic blood loss anemia and had just received 1 unit PRBCs, but will likely need more. Heart rates demonstrate improved control at this time, while on Cardizem drip.  10/10:Patient still has hemoglobin of 7 and will require 1 more unit of PRBC transfusion. LFTs are downtrending. INR is now 1.1. No further overt bleeding noted overnight. Low potassium and magnesium noted which will be repleted. Heart rates are controlled. GI planning for likely EGD soon.  10/11: Patient's hemoglobin this morning 10.2 and INR 1.4.  Magnesium and potassium repleted.  She still had some dark stool overnight.  Plans are for EGD and colonoscopy on 10/12.  No agitation noted overnight.  She is stable for transfer to telemetry.  10/12: Plans are for patient to undergo EGD and colonoscopy today.  Hemoglobin levels remained  stable.  Anticipate discharge in a.m. if stable.  Assessment & Plan:   Active Problems:   Cardiac pacemaker in situ   Atrial fibrillation (HCC)   Supratherapeutic INR   Acute anemia   CAD (coronary artery disease)   Atrial fibrillation with RVR (HCC)   Gastrointestinal hemorrhage   Hypercoagulopathy (HCC)   Dilation of biliary tract   Elevated LFTs   Acute symptomatic blood loss anemia-improved status post 2 unit PRBC transfusion -Continue on IV Protonix twice daily for now -Continue clear liquid diet and plan for EGD/colonoscopy today -She has received 1 unit PRBC on 10/9 -1 more unit PRBC transfusion on 10/10 givenhemoglobin of 7  Atrial fibrillation with RVR and supratherapeutic INR-improved -Holding home warfarinuntil further GI evaluation -Vitamin K 5 mg IV given in EDand INR is 1.1 on 10/10 -Cardizem dripdiscontinued on 10/10 -Continue home metoprolol with increased dose to 100 mg every 12 hours -Patient is status post pacemaker interrogation with no evidence of lethal arrhythmias -Hold benazepril -TSH 1.4 -Okay for transfer to telemetry  Ischemic cardiomyopathy -History of three-vessel CABG in 1970 -No chest pain currently noted -High-sensitivity troponin noted to be 8 -Hold home atorvastatin due to transaminitis -Hold benazepril for now  Abdominal pain with mild transaminitis-downtrending -CT of the abdomen and pelvis with concern for biliary ductal dilatationeven on repeat 10/9 -GI to evaluate forEGD, could not perform MRCP on account of pacemaker -Will need outpatient endoscopic ultrasound -Lipase 18 -Repeat labsordered for a.m.  Multiple falls -Likely secondary to acute anemia and A. fib with RVR -Head and cervical CT negative for acute abnormalities -PT evaluation revealing no home health needs  Hypomagnesemia/hypokalemia -Replete and recheck in a.m.  Agitation/delirium-improved -Ativan ordered as needed   DVT prophylaxis:SCDs  Code Status:Full code Family Communication:We will plan to update family Disposition Plan: Plans for EGD/colonoscopy today.  We will plan to discharge in a.m. if stable.   Consultants:  GI  Procedures:  None  Antimicrobials:   None  Subjective: Patient seen and evaluated today with no new acute complaints or concerns. No acute concerns or events noted overnight.  Minimal confusion overnight.  Objective: Vitals:   03/26/19 1415 03/26/19 1445 03/26/19 1500 03/26/19 1530  BP: (!) 158/89 (!) 156/78 (!) 144/88   Pulse:      Resp: (!) 24     Temp: 99.2 F (37.3 C)     TempSrc:    Oral  SpO2: 97%     Weight:      Height:        Intake/Output Summary (Last 24 hours) at 03/26/2019 1538 Last data filed at 03/25/2019 1600 Gross per 24 hour  Intake 960 ml  Output -  Net 960 ml   Filed Weights   03/23/19 0500 03/24/19 0435 03/25/19 0500  Weight: 54.5 kg 54.5 kg 55.6 kg    Examination:  General exam: Appears calm and comfortable  Respiratory system: Clear to auscultation. Respiratory effort normal. Cardiovascular system: S1 & S2 heard, RRR. No JVD, murmurs, rubs, gallops or clicks. No pedal edema. Gastrointestinal system: Abdomen is nondistended, soft and nontender. No organomegaly or masses felt. Normal bowel sounds heard. Central nervous system: Alert and oriented. No focal neurological deficits. Extremities: Symmetric 5 x 5 power. Skin: No rashes, lesions or ulcers Psychiatry: Judgement and insight appear normal. Mood & affect appropriate.     Data Reviewed: I have personally reviewed following labs and imaging studies  CBC: Recent Labs  Lab 03/22/19 1248  03/23/19 0017 03/23/19 0814 03/24/19 0536 03/25/19 0447 03/26/19 0438  WBC 14.5*  --  10.8* 10.9* 11.6* 10.2 7.0  NEUTROABS 11.1*  --   --   --   --   --   --   HGB 8.3*   < > 5.7* 7.5* 7.0* 10.2* 10.0*  HCT 26.4*   < > 17.1* 23.4* 21.1* 30.1* 30.1*  MCV 96.7  --  90.0 92.9 90.6 88.8 89.6   PLT 224  --  155 183 195 227 238   < > = values in this interval not displayed.   Basic Metabolic Panel: Recent Labs  Lab 03/22/19 1248 03/23/19 0814 03/24/19 0536 03/25/19 0447 03/26/19 0438  NA 141 139 139 140 140  K 3.9 3.6 2.9* 3.6 3.3*  CL 108 111 109 108 106  CO2 21* 20* 23 24 26   GLUCOSE 127* 110* 113* 92 102*  BUN 23 23 9  7* <5*  CREATININE 0.61 0.67 0.43* 0.46 0.51  CALCIUM 8.5* 7.8* 8.2* 8.2* 8.4*  MG 1.8  --  1.6* 1.8 1.7   GFR: Estimated Creatinine Clearance: 47.6 mL/min (by C-G formula based on SCr of 0.51 mg/dL). Liver Function Tests: Recent Labs  Lab 03/22/19 1248 03/23/19 0814 03/24/19 0536 03/25/19 0447 03/26/19 0438  AST 78* 53* 45* 41 38  ALT 55* 38 31 29 28   ALKPHOS 169* 111 99 99 101  BILITOT 2.0* 1.7* 1.2 1.0 1.3*  PROT 6.0* 5.2* 5.3* 5.5* 5.5*  ALBUMIN 3.4* 3.1* 3.1* 3.1* 3.1*   Recent Labs  Lab 03/22/19 1959  LIPASE 18   Recent Labs  Lab 03/22/19 1248  AMMONIA 21   Coagulation Profile: Recent Labs  Lab 03/22/19 1440 03/23/19 0814 03/24/19  [id number] 03/25/19 0447 03/26/19 0438  INR >10.0* 1.4* 1.1 1.4* 1.4*   Cardiac Enzymes: No results for input(s): CKTOTAL, CKMB, CKMBINDEX, TROPONINI in the last 168 hours. BNP (last 3 results) No results for input(s): PROBNP in the last 8760 hours. HbA1C: No results for input(s): HGBA1C in the last 72 hours. CBG: No results for input(s): GLUCAP in the last 168 hours. Lipid Profile: No results for input(s): CHOL, HDL, LDLCALC, TRIG, CHOLHDL, LDLDIRECT in the last 72 hours. Thyroid Function Tests: No results for input(s): TSH, T4TOTAL, FREET4, T3FREE, THYROIDAB in the last 72 hours. Anemia Panel: No results for input(s): VITAMINB12, FOLATE, FERRITIN, TIBC, IRON, RETICCTPCT in the last 72 hours. Sepsis Labs: No results for input(s): PROCALCITON, LATICACIDVEN in the last 168 hours.  Recent Results (from the past 240 hour(s))  SARS CORONAVIRUS 2 (TAT 6-24 HRS) Nasopharyngeal Nasopharyngeal  Swab     Status: None   Collection Time: 03/22/19  5:26 PM   Specimen: Nasopharyngeal Swab  Result Value Ref Range Status   SARS Coronavirus 2 NEGATIVE NEGATIVE Final    Comment: (NOTE) SARS-CoV-2 target nucleic acids are NOT DETECTED. The SARS-CoV-2 RNA is generally detectable in upper and lower respiratory specimens during the acute phase of infection. Negative results do not preclude SARS-CoV-2 infection, do not rule out co-infections with other pathogens, and should not be used as the sole basis for treatment or other patient management decisions. Negative results must be combined with clinical observations, patient history, and epidemiological information. The expected result is Negative. Fact Sheet for Patients: SugarRoll.be Fact Sheet for Healthcare Providers: https://www.woods-mathews.com/ This test is not yet approved or cleared by the [place] FDA and  has been authorized for detection and/or diagnosis of SARS-CoV-2 by FDA under an Emergency Use Authorization (EUA). This EUA will remain  in effect (meaning this test can be used) for the duration of the COVID-19 declaration under Section 56 4(b)(1) of the Act, 21 U.S.C. section 360bbb-3(b)(1), unless the authorization is terminated or revoked sooner. Performed at [facility], [place] [address], [place], [place] [address]   MRSA PCR Screening     Status: None   Collection Time: 03/22/19 11:17 PM   Specimen: Nasal Mucosa; Nasopharyngeal  Result Value Ref Range Status   MRSA by PCR NEGATIVE NEGATIVE Final    Comment:        The GeneXpert MRSA Assay (FDA approved for NASAL specimens only), is one component of a comprehensive MRSA colonization surveillance program. It is not intended to diagnose MRSA infection nor to guide or monitor treatment for MRSA infections. Performed at [facility], [address], [place], [place] [address]          Radiology Studies:  No results found.      Scheduled Meds: . [MAR Hold] Chlorhexidine Gluconate Cloth  6 each Topical Daily  . [MAR Hold] DULoxetine  30 mg Oral Daily  . [MAR Hold] feeding supplement  1 Container Oral TID BM  . [MAR Hold] gabapentin  300 mg Oral QHS  . [MAR Hold] levothyroxine  50 mcg Oral Q0600  . [MAR Hold] metoprolol tartrate  100 mg Oral BID  . [MAR Hold] pantoprazole (PROTONIX) IV  40 mg Intravenous Q12H  . [MAR Hold] QUEtiapine  12.5 mg Oral Once   Continuous Infusions:   LOS: 4 days    Time spent: 30 minutes    Denman Pichardo Darleen Crocker, DO [facility] Pager [phone]  If 7PM-7AM, please contact night-coverage [email or web address] Password [facility] 03/26/2019, 3:38 PM

## 2019-03-26 NOTE — Transfer of Care (Signed)
Immediate Anesthesia Transfer of Care Note  Patient: Angelica Hernandez  Procedure(s) Performed: ESOPHAGOGASTRODUODENOSCOPY (EGD) WITH PROPOFOL (N/A ) COLONOSCOPY (N/A )  Patient Location: PACU  Anesthesia Type:General  Level of Consciousness: awake  Airway & Oxygen Therapy: Patient Spontanous Breathing  Post-op Assessment: Report given to RN  Post vital signs: Reviewed and stable  Last Vitals:  Vitals Value Taken Time  BP 149/82 03/26/19 1615  Temp    Pulse 99 03/26/19 1616  Resp 24 03/26/19 1616  SpO2 99 % 03/26/19 1616  Vitals shown include unvalidated device data.  Last Pain:  Vitals:   03/26/19 1530  TempSrc: Oral  PainSc:          Complications: No apparent anesthesia complications

## 2019-03-26 NOTE — Anesthesia Postprocedure Evaluation (Signed)
Anesthesia Post Note  Patient: Angelica Hernandez  Procedure(s) Performed: ESOPHAGOGASTRODUODENOSCOPY (EGD) WITH PROPOFOL (N/A ) COLONOSCOPY (N/A )  Patient location during evaluation: PACU Anesthesia Type: General Level of consciousness: awake and alert Pain management: pain level controlled Vital Signs Assessment: post-procedure vital signs reviewed and stable Respiratory status: spontaneous breathing Postop Assessment: no apparent nausea or vomiting Anesthetic complications: no     Last Vitals:  Vitals:   03/26/19 1500 03/26/19 1612  BP: (!) 144/88 (!) (P) 149/82  Pulse:  (P) 76  Resp:  (!) (P) 25  Temp:  (P) 36.6 C  SpO2:  (P) 99%    Last Pain:  Vitals:   03/26/19 1612  TempSrc:   PainSc: (P) 0-No pain                 Norlan Rann

## 2019-03-27 ENCOUNTER — Encounter: Payer: Self-pay | Admitting: Nurse Practitioner

## 2019-03-27 ENCOUNTER — Telehealth: Payer: Self-pay | Admitting: Gastroenterology

## 2019-03-27 ENCOUNTER — Other Ambulatory Visit: Payer: Self-pay

## 2019-03-27 DIAGNOSIS — D649 Anemia, unspecified: Secondary | ICD-10-CM

## 2019-03-27 DIAGNOSIS — K838 Other specified diseases of biliary tract: Secondary | ICD-10-CM

## 2019-03-27 DIAGNOSIS — R7989 Other specified abnormal findings of blood chemistry: Secondary | ICD-10-CM

## 2019-03-27 LAB — BASIC METABOLIC PANEL
Anion gap: 10 (ref 5–15)
BUN: 6 mg/dL — ABNORMAL LOW (ref 8–23)
CO2: 25 mmol/L (ref 22–32)
Calcium: 8.5 mg/dL — ABNORMAL LOW (ref 8.9–10.3)
Chloride: 104 mmol/L (ref 98–111)
Creatinine, Ser: 0.52 mg/dL (ref 0.44–1.00)
GFR calc Af Amer: >60 mL/min (ref >60)
GFR calc non Af Amer: >60 mL/min (ref >60)
Glucose, Bld: 95 mg/dL (ref 70–99)
Potassium: 3.5 mmol/L (ref 3.5–5.1)
Sodium: 139 mmol/L (ref 135–145)

## 2019-03-27 LAB — MAGNESIUM: Magnesium: 1.8 mg/dL (ref 1.7–2.4)

## 2019-03-27 LAB — CBC
HCT: 32.9 % — ABNORMAL LOW (ref 36.0–46.0)
Hemoglobin: 10.7 g/dL — ABNORMAL LOW (ref 12.0–15.0)
MCH: 29.9 pg (ref 26.0–34.0)
MCHC: 32.5 g/dL (ref 30.0–36.0)
MCV: 91.9 fL (ref 80.0–100.0)
Platelets: 248 10*3/uL (ref 150–400)
RBC: 3.58 MIL/uL — ABNORMAL LOW (ref 3.87–5.11)
RDW: 15.5 % (ref 11.5–15.5)
WBC: 7.2 10*3/uL (ref 4.0–10.5)
nRBC: 0 % (ref 0.0–0.2)

## 2019-03-27 LAB — PROTIME-INR
INR: 1.3 — ABNORMAL HIGH (ref 0.8–1.2)
Prothrombin Time: 16.4 seconds — ABNORMAL HIGH (ref 11.4–15.2)

## 2019-03-27 MED ORDER — PANTOPRAZOLE SODIUM 40 MG PO TBEC: 40.0000 mg | DELAYED_RELEASE_TABLET | Freq: Every day | ORAL | 1 refills | Status: DC

## 2019-03-27 MED ORDER — METOPROLOL TARTRATE 100 MG PO TABS: 100.0000 mg | ORAL_TABLET | Freq: Two times a day (BID) | ORAL | 3 refills | Status: DC

## 2019-03-27 MED ORDER — WARFARIN SODIUM 5 MG PO TABS: 5.0000 mg | ORAL_TABLET | Freq: Every day | ORAL | 0 refills | Status: DC

## 2019-03-27 NOTE — Telephone Encounter (Signed)
PATIENT SCHEDULED AND LETTER SENT  °

## 2019-03-27 NOTE — Telephone Encounter (Signed)
Angelica Hernandez, patient discharged from hospital today. Anemia, mildly elevated LFTs/dilated biliary tree. She needs follow-up in 1-2 weeks. She saw Randall Hiss while inpatient.   Doris: Can you arrange for patient to have repeat CBC in 1 week?   [facility] Clinical Pool: Patient needs referral to Dr. Ardis Hughs for endoscopic ultrasound to evaluate dilated biliary tree/transietnt LFT elevation as MRI could not be performed secondary to implanted device.

## 2019-03-27 NOTE — Telephone Encounter (Signed)
Referral sent to Dr. Ardis Hughs for EUS via Epic.

## 2019-03-27 NOTE — Addendum Note (Signed)
Addended by: Hassan Rowan on: 03/27/2019 02:22 PM   Modules accepted: Orders

## 2019-03-27 NOTE — Discharge Summary (Signed)
Physician Discharge Summary  Angelica Hernandez [id number] DOB: 01-03-1934 DOA: 03/22/2019  PCP: Caryl Bis, MD  Admit date: 03/22/2019  Discharge date: 03/27/2019  Admitted From:Home  Disposition:  Home  Recommendations for Outpatient Follow-up:  1. Follow up with PCP in 3 to 5 days to recheck INR levels once Coumadin resumed on 10/15 2. Will need close cardiology follow-up to monitor heart rate control on increased dose of metoprolol 3. Follow-up with GI as scheduled for outpatient endoscopic ultrasound 4. Continue on Protonix as prescribed 5. Resume warfarin with tight INR control on 10/15 6. Remain now on metoprolol 100 mg twice daily and hold ACE inhibitor  Home Health: None  Equipment/Devices: None  Discharge Condition: Stable  CODE STATUS: Full  Diet recommendation: Heart Healthy  Brief/Interim Summary: Per HPI: [place] Burdenis a 85 y.o.femalewith medical history significant foratrial fib-pacemaker status, depression, ischemic cardiomyopathy, coronary artery disease.Patient was brought to the ED via EMS with reports of a fall. Patient is alone, she is able to answer questions.She tells me she has had abdominal pain for 2 days now-Upper abdomen. Patient reports she fell yesterday and today.She reports dizziness when standing,which causedherfall yesterday. Today she is unable to tell me the circumstances surrounding her fall, but per notes patient was straining to have a bowel movement when she fell. She hit the back of her head when she fell. She also reports a drycough of 2 days,no difficulty breathing. She denies chest pain. She is unaware of rapid heart rate. Patient reports yesterday and today she had bowel movements that had small amount of blood. She denies black stools. No vomiting of blood.  10/9:Patient was admitted with acute symptomatic blood loss anemia and had just received 1 unit PRBCs, but will likely need more. Heart rates  demonstrate improved control at this time, while on Cardizem drip.  10/10:Patient still has hemoglobin of 7 and will require 1 more unit of PRBC transfusion. LFTs are downtrending. INR is now 1.1. No further overt bleeding noted overnight. Low potassium and magnesium noted which will be repleted. Heart rates are controlled. GI planning for likely EGD soon.  10/11:Patient's hemoglobin this morning 10.2 and INR 1.4. Magnesium and potassium repleted. She still had some dark stool overnight. Plans are for EGD and colonoscopy on 10/12. No agitation noted overnight. She is stable for transfer to telemetry.  10/12: Plans are for patient to undergo EGD and colonoscopy today.  Hemoglobin levels remained stable.  Anticipate discharge in a.m. if stable.  10/13: Patient had undergone EGD and colonoscopy on 10/12.  No significant findings noted on EGD, but colonoscopy revealed possible source of diverticular bleed from elevated INR levels.  No active bleeding was otherwise noted at the time.  She may resume her warfarin on 10/15 and will need close INR follow-up checks.  She has had no further active bleeding and INR this morning is 1.3.  Hemoglobin is stable at 10.7.  She is tolerating her diet and has been seen by physical therapy with no home health needs noted.  She is stable for discharge with close follow-up as noted above.  Discharge Diagnoses:  Active Problems:   Cardiac pacemaker in situ   Atrial fibrillation (HCC)   Supratherapeutic INR   Acute anemia   CAD (coronary artery disease)   Atrial fibrillation with RVR (HCC)   Gastrointestinal hemorrhage   Hypercoagulopathy (HCC)   Dilation of biliary tract   Elevated LFTs  Principal discharge diagnosis: Acute symptomatic blood loss anemia from likely diverticular bleed with  supratherapeutic INR; status post 2 unit PRBC transfusion.  Discharge Instructions  Discharge Instructions    Diet - low sodium heart healthy   Complete by: As  directed    Increase activity slowly   Complete by: As directed      Allergies as of 03/27/2019   No Known Allergies     Medication List    STOP taking these medications   benazepril 5 MG tablet Commonly known as: LOTENSIN   metoprolol succinate 50 MG 24 hr tablet Commonly known as: TOPROL-XL     TAKE these medications   atorvastatin 10 MG tablet Commonly known as: LIPITOR Take 10 mg by mouth daily.   DULoxetine 30 MG capsule Commonly known as: CYMBALTA Take 1 capsule by mouth daily.   Ferrex 150 150 MG capsule Generic drug: iron polysaccharides Take 150 mg by mouth daily.   gabapentin 300 MG capsule Commonly known as: NEURONTIN Take 300 mg by mouth at bedtime. For pain/sleep   levothyroxine 50 MCG tablet Commonly known as: SYNTHROID Take 50 mcg by mouth daily before breakfast.   metoprolol tartrate 100 MG tablet Commonly known as: LOPRESSOR Take 1 tablet (100 mg total) by mouth 2 (two) times daily.   nitroGLYCERIN 0.4 MG SL tablet Commonly known as: NITROSTAT Place 1 tablet (0.4 mg total) under the tongue every 5 (five) minutes as needed for chest pain.   pantoprazole 40 MG tablet Commonly known as: Protonix Take 1 tablet (40 mg total) by mouth daily.   vitamin C 500 MG tablet Commonly known as: ASCORBIC ACID Take 500 mg by mouth 2 (two) times daily.   Vitamin D3 25 MCG (1000 UT) Caps Take 1,000 Units by mouth daily.   warfarin 5 MG tablet Commonly known as: COUMADIN Take as directed. If you are unsure how to take this medication, talk to your nurse or doctor. Original instructions: Take 1 tablet (5 mg total) by mouth daily. MANAGED BY PMD Start taking on: March 29, 2019 What changed: These instructions start on March 29, 2019. If you are unsure what to do until then, ask your doctor or other care provider.      Follow-up Information    Caryl Bis, MD Follow up in 1 week(s).   Specialty: Family Medicine Contact information: [place] [address] [phone]        Herminio Commons, MD .   Specialty: Cardiology Contact information: [place] [address] [phone]        Thompson Grayer, MD .   Specialty: Cardiology Contact information: [place] [address] [phone]          No Known Allergies  Consultations:  GI   Procedures/Studies: Ct Head Wo Contrast  Result Date: 03/22/2019 CLINICAL DATA:  Fall in bathroom striking back of head. EXAM: CT HEAD WITHOUT CONTRAST CT CERVICAL SPINE WITHOUT CONTRAST TECHNIQUE: Multidetector CT imaging of the head and cervical spine was performed following the standard protocol without intravenous contrast. Multiplanar CT image reconstructions of the cervical spine were also generated. COMPARISON:  None. FINDINGS: CT HEAD FINDINGS Brain: Ventricles and cisterns are within normal. There is mild age related atrophy as well as chronic ischemic microvascular disease. There is no mass, mass effect, shift of midline structures or acute hemorrhage. Suggestion of an old small left posterior watershed infarct. No acute infarction. Vascular: No hyperdense vessel or unexpected calcification. Skull: Normal. Negative for fracture or focal lesion. Sinuses/Orbits:  No acute finding. Other: None. CT CERVICAL SPINE FINDINGS Alignment: Normal. Skull base and vertebrae: Mild spondylosis of the cervical spine. Vertebral body heights are maintained. Atlantoaxial articulation is normal. Mild uncovertebral joint spurring. Mild left-sided neural from narrowing at the C5-6 level and right-sided neural foraminal narrowing at the C6-7 level. No acute fracture or subluxation. Soft tissues and spinal canal: No prevertebral fluid or swelling. No visible canal hematoma. Disc levels:  Mild disc space narrowing at the C5-6 and C6-7 levels. Upper chest: No acute findings. Mild calcified plaque over the aortic arch. Other: None. IMPRESSION: 1.  No  acute brain injury. 2. Mild chronic ischemic microvascular disease and age related atrophy. 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 3.  No acute cervical spine injury. 4. Mild spondylosis of the cervical spine with disc disease at the C5-6 and C6-7 levels and mild neural foraminal narrowing as described. 5.  Aortic Atherosclerosis (ICD10-I70.0). Electronically Signed   By: Marin Olp M.D.   On: 03/22/2019 15:41   Ct Cervical Spine Wo Contrast  Result Date: 03/22/2019 CLINICAL DATA:  Fall in bathroom striking back of head. EXAM: CT HEAD WITHOUT CONTRAST CT CERVICAL SPINE WITHOUT CONTRAST TECHNIQUE: Multidetector CT imaging of the head and cervical spine was performed following the standard protocol without intravenous contrast. Multiplanar CT image reconstructions of the cervical spine were also generated. COMPARISON:  None. FINDINGS: CT HEAD FINDINGS Brain: Ventricles and cisterns are within normal. There is mild age related atrophy as well as chronic ischemic microvascular disease. There is no mass, mass effect, shift of midline structures or acute hemorrhage. Suggestion of an old small left posterior watershed infarct. No acute infarction. Vascular: No hyperdense vessel or unexpected calcification. Skull: Normal. Negative for fracture or focal lesion. Sinuses/Orbits: No acute finding. Other: None. CT CERVICAL SPINE FINDINGS Alignment: Normal. Skull base and vertebrae: Mild spondylosis of the cervical spine. Vertebral body heights are maintained. Atlantoaxial articulation is normal. Mild uncovertebral joint spurring. Mild left-sided neural from narrowing at the C5-6 level and right-sided neural foraminal narrowing at the C6-7 level. No acute fracture or subluxation. Soft tissues and spinal canal: No prevertebral fluid or swelling. No visible canal hematoma. Disc levels:  Mild disc space narrowing at the C5-6 and C6-7 levels. Upper chest: No acute findings. Mild calcified plaque over the aortic arch.  Other: None. IMPRESSION: 1.  No acute brain injury. 2. Mild chronic ischemic microvascular disease and age related atrophy. 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 3.  No acute cervical spine injury. 4. Mild spondylosis of the cervical spine with disc disease at the C5-6 and C6-7 levels and mild neural foraminal narrowing as described. 5.  Aortic Atherosclerosis (ICD10-I70.0). Electronically Signed   By: Marin Olp M.D.   On: 03/22/2019 15:41   Ct Abdomen Pelvis W Contrast  Result Date: 03/22/2019 CLINICAL DATA:  85 year old female with history of trauma from a fall in the bathroom. Abdominal pain. Blood in stool. EXAM: CT ABDOMEN AND PELVIS WITH CONTRAST TECHNIQUE: Multidetector CT imaging of the abdomen and pelvis was performed using the standard protocol following bolus administration of intravenous contrast. CONTRAST:  55mL OMNIPAQUE IOHEXOL 300 MG/ML  SOLN COMPARISON:  None. FINDINGS: Lower chest: Aortic atherosclerosis. Mild cardiomegaly. Pacemaker lead extending in the right ventricle. Status post median sternotomy. Hepatobiliary: No suspicious cystic or solid hepatic lesions. Very mild intrahepatic biliary ductal dilatation, particularly and segments 2 and 3 of the liver. Common bile duct is dilated measuring up to 12 mm in the porta hepatis. No definite calcified  stone in the common bile duct. Gallbladder is moderately distended. No definite calcified gallstones. No gallbladder wall thickening or pericholecystic fluid or inflammatory changes. Pancreas: No pancreatic mass. No pancreatic ductal dilatation. No pancreatic or peripancreatic fluid collections or inflammatory changes. Spleen: Unremarkable. Adrenals/Urinary Tract: 1.3 cm low-attenuation lesion in the upper pole the right kidney, compatible with a simple cyst. Subcentimeter low-attenuation lesion in the anterior aspect of the interpolar region of the left kidney, too small to characterize, but statistically likely to represent a cyst. No  hydroureteronephrosis. Urinary bladder is normal in appearance. Bilateral adrenal glands are normal in appearance. Stomach/Bowel: Normal appearance of the stomach. No pathologic dilatation of small bowel or colon. The appendix is not confidently identified and may be surgically absent. Regardless, there are no inflammatory changes noted adjacent to the cecum to suggest the presence of an acute appendicitis at this time. Vascular/Lymphatic: Aortic atherosclerosis, without evidence of aneurysm or dissection in the abdominal or pelvic vasculature. No lymphadenopathy noted in the abdomen or pelvis. Reproductive: Status post hysterectomy.  Ovaries are a trophic. Other: No significant volume of ascites.  No pneumoperitoneum. Musculoskeletal: Chronic appearing compression fracture of superior endplate of L1 with [id number] loss of anterior vertebral body height. There are no aggressive appearing lytic or blastic lesions noted in the visualized portions of the skeleton. IMPRESSION: 1. No evidence of significant acute traumatic injury to the abdomen or pelvis. 2. Mild intra and extrahepatic biliary ductal dilatation. No choledocholithiasis. No definite pancreatic head mass. These findings could simply be age related, however, if there is any biochemical evidence of biliary tract obstruction, further evaluation with abdominal MRI with and without IV gadolinium with MRCP should be considered to exclude the possibility of small ampullary neoplasm. 3. Cardiomegaly. 4. Aortic atherosclerosis. Electronically Signed   By: Vinnie Langton M.D.   On: 03/22/2019 15:41   Ct Abdomen W Wo Contrast  Result Date: 03/23/2019 CLINICAL DATA:  Abnormal liver function tests. EXAM: CT ABDOMEN WITHOUT AND WITH CONTRAST TECHNIQUE: Multidetector CT imaging of the abdomen was performed following the standard protocol before and following the bolus administration of intravenous contrast. CONTRAST:  152mL OMNIPAQUE IOHEXOL 300 MG/ML  SOLN COMPARISON:   01/20/2019 FINDINGS: Lower chest: Centrilobular emphsyema noted. Hepatobiliary: No hypervascular lesion in the liver parenchyma on arterial phase postcontrast imaging. Gallbladder is distended with intermediate attenuation material in the lumen, potentially sludge or vicarious excretion of contrast from yesterday's CT scan. Trace pericholecystic fluid noted. As noted yesterday, there is mild intrahepatic and marked extrahepatic biliary duct dilatation. Extrahepatic common duct measures 11 mm diameter today compared to 12 mm yesterday. Common bile duct in the head of the pancreas is 12 mm diameter. The bile in the common duct is low-density, approaching water attenuation at 11 Hounsfield units. This becomes gradually higher in attenuation towards the ampulla where average attenuation of luminal contents in the common bile duct at the head of pancreas are 49 Hounsfield units on postcontrast series 9, image 40. There is an abrupt cut off of the pancreatic duct at the ampulla. Pancreas: Pancreatic parenchyma is atrophic without dilatation of the main pancreatic duct. No pancreatic head mass evident. Spleen: No splenomegaly. No focal mass lesion. Adrenals/Urinary Tract: No adrenal nodule or mass. Small cyst again noted upper pole right kidney. Left kidney unremarkable. Stomach/Bowel: Tiny hiatal hernia. Stomach otherwise unremarkable. Duodenum is normally positioned as is the ligament of Treitz. No small bowel or colonic dilatation within the visualized abdomen. Vascular/Lymphatic: There is abdominal aortic atherosclerosis without aneurysm. There is  no gastrohepatic or hepatoduodenal ligament lymphadenopathy. No intraperitoneal or retroperitoneal lymphadenopathy. Other: No intraperitoneal free fluid. Musculoskeletal: No worrisome lytic or sclerotic osseous abnormality. Mild superior endplate compression deformity at L1. IMPRESSION: 1. Similar appearance of intra and extrahepatic biliary duct dilatation with common bile  duct dilatation extending right to the ampulla. Intraluminal contents of the common bile duct in the head of pancreas just proximal to the ampulla are higher attenuation than in the common duct at the level of the porta hepatis. This could be related to intraluminal sludge, noncalcified stone, or hemorrhage. This region does not appear to substantially enhance after IV contrast administration making soft tissue lesion less likely but not entirely excluded. Presence of a permanent pacemaker likely precludes MRCP. ERCP may be warranted to further evaluate given the abnormal attenuation in the distal common bile duct period. 2. Tiny hiatal hernia. 3.  Aortic Atherosclerois (ICD10-170.0) Electronically Signed   By: Misty Stanley M.D.   On: 03/23/2019 16:52   Dg Chest Portable 1 View  Result Date: 03/22/2019 CLINICAL DATA:  Pain after fall. EXAM: PORTABLE CHEST 1 VIEW COMPARISON:  None. FINDINGS: The heart size and mediastinal contours are within normal limits. Both lungs are clear. No pneumothorax or pleural effusion is noted. Sternotomy wires are noted. Left-sided pacemaker is unchanged in position. The visualized skeletal structures are unremarkable. IMPRESSION: No active disease. Electronically Signed   By: Marijo Conception M.D.   On: 03/22/2019 12:46   Dg Humerus Left  Result Date: 03/22/2019 CLINICAL DATA:  Tripped on file, history of atrial fibrillation. EXAM: LEFT HUMERUS - 2+ VIEW COMPARISON:  None FINDINGS: There is no evidence of fracture or other focal bone lesions. Soft tissues are unremarkable. IMPRESSION: Negative. Electronically Signed   By: Zetta Bills M.D.   On: 03/22/2019 12:47     Discharge Exam: Vitals:   03/27/19 0534 03/27/19 0907  BP: 91/65 124/74  Pulse: (!) 58 86  Resp: 17   Temp: 98.6 F (37 C)   SpO2: 97%    Vitals:   03/26/19 1635 03/26/19 2024 03/27/19 0534 03/27/19 0907  BP: (!) 140/98 132/78 91/65 124/74  Pulse: 78 87 (!) 58 86  Resp: 17 18 17    Temp: 98 F  (36.7 C) 98.7 F (37.1 C) 98.6 F (37 C)   TempSrc: Oral Oral Oral   SpO2: 99% 90% 97%   Weight:      Height:        General: Pt is alert, awake, not in acute distress Cardiovascular: RRR, S1/S2 +, no rubs, no gallops Respiratory: CTA bilaterally, no wheezing, no rhonchi Abdominal: Soft, NT, ND, bowel sounds + Extremities: no edema, no cyanosis    The results of significant diagnostics from this hospitalization (including imaging, microbiology, ancillary and laboratory) are listed below for reference.     Microbiology: Recent Results (from the past 240 hour(s))  SARS CORONAVIRUS 2 (TAT 6-24 HRS) Nasopharyngeal Nasopharyngeal Swab     Status: None   Collection Time: 03/22/19  5:26 PM   Specimen: Nasopharyngeal Swab  Result Value Ref Range Status   SARS Coronavirus 2 NEGATIVE NEGATIVE Final    Comment: (NOTE) SARS-CoV-2 target nucleic acids are NOT DETECTED. The SARS-CoV-2 RNA is generally detectable in upper and lower respiratory specimens during the acute phase of infection. Negative results do not preclude SARS-CoV-2 infection, do not rule out co-infections with other pathogens, and should not be used as the sole basis for treatment or other patient management decisions. Negative results must be  combined with clinical observations, patient history, and epidemiological information. The expected result is Negative. Fact Sheet for Patients: SugarRoll.be Fact Sheet for Healthcare Providers: https://www.woods-mathews.com/ This test is not yet approved or cleared by the [place] FDA and  has been authorized for detection and/or diagnosis of SARS-CoV-2 by FDA under an Emergency Use Authorization (EUA). This EUA will remain  in effect (meaning this test can be used) for the duration of the COVID-19 declaration under Section 56 4(b)(1) of the Act, 21 U.S.C. section 360bbb-3(b)(1), unless the authorization is terminated or revoked  sooner. Performed at [facility], [place] [address] [place], [place] [address]   MRSA PCR Screening     Status: None   Collection Time: 03/22/19 11:17 PM   Specimen: Nasal Mucosa; Nasopharyngeal  Result Value Ref Range Status   MRSA by PCR NEGATIVE NEGATIVE Final    Comment:        The GeneXpert MRSA Assay (FDA approved for NASAL specimens only), is one component of a comprehensive MRSA colonization surveillance program. It is not intended to diagnose MRSA infection nor to guide or monitor treatment for MRSA infections. Performed at [facility], [address], [place], [place] [address]      Labs: BNP (last 3 results) No results for input(s): BNP in the last 8760 hours. Basic Metabolic Panel: Recent Labs  Lab 03/22/19 1248 03/23/19 0814 03/24/19 0536 03/25/19 0447 03/26/19 0438 03/27/19 0553  NA 141 139 139 140 140 139  K 3.9 3.6 2.9* 3.6 3.3* 3.5  CL 108 111 109 108 106 104  CO2 21* 20* 23 24 26 25   GLUCOSE 127* 110* 113* 92 102* 95  BUN 23 23 9  7* <5* 6*  CREATININE 0.61 0.67 0.43* 0.46 0.51 0.52  CALCIUM 8.5* 7.8* 8.2* 8.2* 8.4* 8.5*  MG 1.8  --  1.6* 1.8 1.7 1.8   Liver Function Tests: Recent Labs  Lab 03/22/19 1248 03/23/19 0814 03/24/19 0536 03/25/19 0447 03/26/19 0438  AST 78* 53* 45* 41 38  ALT 55* 38 31 29 28   ALKPHOS 169* 111 99 99 101  BILITOT 2.0* 1.7* 1.2 1.0 1.3*  PROT 6.0* 5.2* 5.3* 5.5* 5.5*  ALBUMIN 3.4* 3.1* 3.1* 3.1* 3.1*   Recent Labs  Lab 03/22/19 1959  LIPASE 18   Recent Labs  Lab 03/22/19 1248  AMMONIA 21   CBC: Recent Labs  Lab 03/22/19 1248  03/23/19 0814 03/24/19 0536 03/25/19 0447 03/26/19 0438 03/27/19 0553  WBC 14.5*   < > 10.9* 11.6* 10.2 7.0 7.2  NEUTROABS 11.1*  --   --   --   --   --   --   HGB 8.3*   < > 7.5* 7.0* 10.2* 10.0* 10.7*  HCT 26.4*   < > 23.4* 21.1* 30.1* 30.1* 32.9*  MCV 96.7   < > 92.9 90.6 88.8 89.6 91.9  PLT 224   < > 183 195 227 238 248   < > = values in this interval not  displayed.   Cardiac Enzymes: No results for input(s): CKTOTAL, CKMB, CKMBINDEX, TROPONINI in the last 168 hours. BNP: Invalid input(s): POCBNP CBG: No results for input(s): GLUCAP in the last 168 hours. D-Dimer No results for input(s): DDIMER in the last 72 hours. Hgb A1c No results for input(s): HGBA1C in the last 72 hours. Lipid Profile No results for input(s): CHOL, HDL, LDLCALC, TRIG, CHOLHDL, LDLDIRECT in the last 72 hours. Thyroid function studies No results for input(s): TSH, T4TOTAL, T3FREE, THYROIDAB in  the last 72 hours.  Invalid input(s): FREET3 Anemia work up No results for input(s): VITAMINB12, FOLATE, FERRITIN, TIBC, IRON, RETICCTPCT in the last 72 hours. Urinalysis    Component Value Date/Time   COLORURINE YELLOW 03/22/2019 1226   APPEARANCEUR HAZY (A) 03/22/2019 1226   LABSPEC >1.046 (H) 03/22/2019 1226   PHURINE 5.0 03/22/2019 1226   GLUCOSEU NEGATIVE 03/22/2019 1226   HGBUR NEGATIVE 03/22/2019 1226   BILIRUBINUR NEGATIVE 03/22/2019 1226   KETONESUR NEGATIVE 03/22/2019 1226   PROTEINUR NEGATIVE 03/22/2019 1226   NITRITE NEGATIVE 03/22/2019 1226   LEUKOCYTESUR NEGATIVE 03/22/2019 1226   Sepsis Labs Invalid input(s): PROCALCITONIN,  WBC,  LACTICIDVEN Microbiology Recent Results (from the past 240 hour(s))  SARS CORONAVIRUS 2 (TAT 6-24 HRS) Nasopharyngeal Nasopharyngeal Swab     Status: None   Collection Time: 03/22/19  5:26 PM   Specimen: Nasopharyngeal Swab  Result Value Ref Range Status   SARS Coronavirus 2 NEGATIVE NEGATIVE Final    Comment: (NOTE) SARS-CoV-2 target nucleic acids are NOT DETECTED. The SARS-CoV-2 RNA is generally detectable in upper and lower respiratory specimens during the acute phase of infection. Negative results do not preclude SARS-CoV-2 infection, do not rule out co-infections with other pathogens, and should not be used as the sole basis for treatment or other patient management decisions. Negative results must be combined  with clinical observations, patient history, and epidemiological information. The expected result is Negative. Fact Sheet for Patients: SugarRoll.be Fact Sheet for Healthcare Providers: https://www.woods-mathews.com/ This test is not yet approved or cleared by the [place] FDA and  has been authorized for detection and/or diagnosis of SARS-CoV-2 by FDA under an Emergency Use Authorization (EUA). This EUA will remain  in effect (meaning this test can be used) for the duration of the COVID-19 declaration under Section 56 4(b)(1) of the Act, 21 U.S.C. section 360bbb-3(b)(1), unless the authorization is terminated or revoked sooner. Performed at [facility], [place] [address], [place], [place] [address]   MRSA PCR Screening     Status: None   Collection Time: 03/22/19 11:17 PM   Specimen: Nasal Mucosa; Nasopharyngeal  Result Value Ref Range Status   MRSA by PCR NEGATIVE NEGATIVE Final    Comment:        The GeneXpert MRSA Assay (FDA approved for NASAL specimens only), is one component of a comprehensive MRSA colonization surveillance program. It is not intended to diagnose MRSA infection nor to guide or monitor treatment for MRSA infections. Performed at [facility], [address], [place],  [address]      Time coordinating discharge: 40 minutes  SIGNED:   Rodena Goldmann, DO [facility] 03/27/2019, 10:52 AM  If 7PM-7AM, please contact night-coverage [email or web address] Password [email or web address]

## 2019-03-28 ENCOUNTER — Other Ambulatory Visit: Payer: Self-pay

## 2019-03-28 ENCOUNTER — Telehealth: Payer: Self-pay

## 2019-03-28 DIAGNOSIS — D649 Anemia, unspecified: Secondary | ICD-10-CM

## 2019-03-28 LAB — SURGICAL PATHOLOGY

## 2019-03-28 NOTE — Telephone Encounter (Signed)
Angelica Banister, MD  Timothy Lasso, RN        She was admitted recently to an outside hospital with an INR of greater than 10 and a hemoglobin of 6. Liver tests were elevated at admission and then returned to normal as she was transfused, stabilized. CT scan showed dilated biliary tree. Question of intraductal sludge.   Seems reasonable to proceed with endoscopic ultrasound of the biliary tree and endoscopic visualization of the major papilla. Please offer next available endoscopic ultrasound with either myself or Dr. Rush Landmark.  She is on a blood thinner Coumadin and that will need to be held for 5 days prior to the scheduled endoscopic ultrasound. The prescribing physician for that blood thinner will need to weigh in on the safety of that recommendation. Thank you   Please advise the referring team to repeat her liver tests in 2 weeks and to forward those results here.   Please convert this to a phone note for record-keeping purposes.   Previous Messages

## 2019-03-28 NOTE — Telephone Encounter (Signed)
Pt's daughter, Clarise Cruz, is aware of the results and plan. I am mailing the lab order to her to do in one week. She is aware she will have an appointment. Aware the referral was made to Dr. Ardis Hughs and they will call her.

## 2019-03-29 ENCOUNTER — Telehealth: Payer: Self-pay | Admitting: *Deleted

## 2019-03-29 ENCOUNTER — Other Ambulatory Visit: Payer: Self-pay

## 2019-03-29 DIAGNOSIS — R7989 Other specified abnormal findings of blood chemistry: Secondary | ICD-10-CM

## 2019-03-29 DIAGNOSIS — K838 Other specified diseases of biliary tract: Secondary | ICD-10-CM

## 2019-03-29 NOTE — Telephone Encounter (Signed)
Noted. Ok to repeat LFTs and send results to [facility]. She is scheduled to see Randall Hiss on 04/16/19

## 2019-03-29 NOTE — Telephone Encounter (Signed)
Received a call from [facility]. Dr. Ardis Hughs wants patient to have repeat LFT's in 2 weeks and send them the results. Patient scheduled for EUS 11/19. Please advise Steward thanks

## 2019-03-29 NOTE — Telephone Encounter (Signed)
I spoke with Mindy RN at [facility] and she will have the MD order repeat LFT in 2 weeks.  I have also mailed the information to the pt home and instructed over the phone.  COVID testing has also been advised

## 2019-03-29 NOTE — Telephone Encounter (Signed)
LMOVM for pt to advise to go for lab work in 2 weeks at [facility].

## 2019-03-30 ENCOUNTER — Ambulatory Visit: Payer: Medicare Other | Admitting: Cardiovascular Disease

## 2019-03-30 ENCOUNTER — Encounter: Payer: Self-pay | Admitting: Cardiovascular Disease

## 2019-03-30 ENCOUNTER — Other Ambulatory Visit: Payer: Self-pay

## 2019-03-30 ENCOUNTER — Telehealth: Payer: Self-pay | Admitting: Pharmacist

## 2019-03-30 VITALS — BP 113/65 | HR 84 | Temp 97.8°F | Ht 66.5 in | Wt 118.0 lb

## 2019-03-30 DIAGNOSIS — Z23 Encounter for immunization: Secondary | ICD-10-CM | POA: Diagnosis not present

## 2019-03-30 DIAGNOSIS — I5022 Chronic systolic (congestive) heart failure: Secondary | ICD-10-CM

## 2019-03-30 DIAGNOSIS — I25708 Atherosclerosis of coronary artery bypass graft(s), unspecified, with other forms of angina pectoris: Secondary | ICD-10-CM

## 2019-03-30 DIAGNOSIS — R001 Bradycardia, unspecified: Secondary | ICD-10-CM

## 2019-03-30 DIAGNOSIS — Z950 Presence of cardiac pacemaker: Secondary | ICD-10-CM

## 2019-03-30 DIAGNOSIS — I4821 Permanent atrial fibrillation: Secondary | ICD-10-CM | POA: Diagnosis not present

## 2019-03-30 DIAGNOSIS — I429 Cardiomyopathy, unspecified: Secondary | ICD-10-CM

## 2019-03-30 DIAGNOSIS — Z951 Presence of aortocoronary bypass graft: Secondary | ICD-10-CM | POA: Diagnosis not present

## 2019-03-30 MED ORDER — ASPIRIN EC 325 MG PO TBEC: 325.0000 mg | DELAYED_RELEASE_TABLET | Freq: Every day | ORAL | 0 refills | Status: DC

## 2019-03-30 NOTE — Telephone Encounter (Signed)
Timothy Lasso, RN on 03/29/2019    [facility] [place], [place]  [address] Phone:  [phone]   Fax:  [phone]   03/29/2019   RE:      Angelica Hernandez DOB:   1933-10-12 MRN:   [id number]   Dear Dr Bronson Ing,    We have scheduled the above patient for an endoscopic procedure. Our records show that she is on anticoagulation therapy.   Please advise as to how long the patient may come off her therapy of Coumadin prior to the procedure, which is scheduled for 05/03/19.  Please fax or route back to [phone] ATTN Koren Shiver BSN/RN

## 2019-03-30 NOTE — Telephone Encounter (Signed)
Called pt and spoke with daughter Clarise Cruz (on dpr) and she is aware. She will take patient for lab work.

## 2019-03-30 NOTE — Progress Notes (Signed)
SUBJECTIVE: The patient presents for posthospitalization follow-up after being hospitalized for acute symptomatic blood loss anemia from diverticular bleed with supratherapeutic INR.  She received 2 units of PRBC transfusion.  She has a history of coronary artery bypass graft surgery, sick sinus syndrome, permanent atrial fibrillation, mild dilation of ascending aorta, and pacemaker. Three-vessel CABG was performed in the 1970s. She had implantation of a Engineer, production pacemaker in 2008. She underwent generator replacement on 06/15/2017.  The patient denies any symptoms of chest pain, palpitations, shortness of breath, lightheadedness, dizziness, leg swelling, orthopnea, PND, and syncope.  She is here with her daughter, Judson Roch.  The patient, Judson Roch and Judson Roch is 8 year old son live together.  She denies any further GI bleeding.  She would prefer to be off of blood thinners altogether.  She is willing to be on aspirin.    Social history: She was born in [place]. Her father used to work in the [employer]. She moved to [place] when her daughter was 29 years old and she is now over 50 years old. She moved to [place] in 2019 as her daughter Judson Roch) bought a house here. The patient has 2 children. Judson Roch has 4 sons and her son who is in his 30s lives with them.  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  Review of Systems: As per "subjective", otherwise negative.  No Known Allergies  Current Outpatient Medications  Medication Sig Dispense Refill  . atorvastatin (LIPITOR) 10 MG tablet Take 10 mg by mouth daily.    . Cholecalciferol (VITAMIN D3) 1000 units CAPS Take 1,000 Units by mouth daily.    . DULoxetine (CYMBALTA) 30 MG capsule Take 1 capsule by mouth daily.    Marland Kitchen FERREX 150 150 MG capsule Take 150 mg by mouth daily.   1  . gabapentin (NEURONTIN) 300 MG capsule Take 300 mg by mouth at bedtime. For pain/sleep    .  levothyroxine (SYNTHROID, LEVOTHROID) 50 MCG tablet Take 50 mcg by mouth daily before breakfast.    . metoprolol tartrate (LOPRESSOR) 100 MG tablet Take 1 tablet (100 mg total) by mouth 2 (two) times daily. 60 tablet 3  . nitroGLYCERIN (NITROSTAT) 0.4 MG SL tablet Place 1 tablet (0.4 mg total) under the tongue every 5 (five) minutes as needed for chest pain. 25 tablet 3  . pantoprazole (PROTONIX) 40 MG tablet Take 1 tablet (40 mg total) by mouth daily. 30 tablet 1  . vitamin C (ASCORBIC ACID) 500 MG tablet Take 500 mg by mouth 2 (two) times daily.     Marland Kitchen warfarin (COUMADIN) 5 MG tablet Take 1 tablet (5 mg total) by mouth daily. MANAGED BY PMD 30 tablet 0   No current facility-administered medications for this visit.     Past Medical History:  Diagnosis Date  . Anxiety   . CAD (coronary artery disease)   . CKD (chronic kidney disease)   . Gastro-esophageal reflux disease without esophagitis   . Hypothyroidism   . Ischemic cardiomyopathy   . Mild dilation of ascending aorta (HCC)   . Moderate protein-calorie malnutrition ([place])   . Permanent atrial fibrillation ([place])   . Severe episode of recurrent major depressive disorder, without psychotic features ([place])   . Symptomatic bradycardia     Past Surgical History:  Procedure Laterality Date  . CESAREAN SECTION    . CORONARY ARTERY BYPASS GRAFT    . OTHER SURGICAL HISTORY  cesearan section   . PACEMAKER GENERATOR CHANGE  06/15/2017   [employer] Accolade SR L 300 ([place] [id number]) implanted by Dr Ernestina Patches at [facility] in [place] for symptomatic bradycardia  . PACEMAKER IMPLANT  11/16/2006   PPM implanted for bradycardia    Social History   Socioeconomic History  . Marital status: Widowed    Spouse name: Not on file  . Number of children: Not on file  . Years of education: Not on file  . Highest education level: Not on file  Occupational History  . Not on file  Social Needs  . Financial  resource strain: Not on file  . Food insecurity    Worry: Not on file    Inability: Not on file  . Transportation needs    Medical: Not on file    Non-medical: Not on file  Tobacco Use  . Smoking status: Current Every Day Smoker    Packs/day: 0.50    Types: Cigarettes  . Smokeless tobacco: Never Used  Substance and Sexual Activity  . Alcohol use: Never    Frequency: Never  . Drug use: Never  . Sexual activity: Not on file  Lifestyle  . Physical activity    Days per week: Not on file    Minutes per session: Not on file  . Stress: Not on file  Relationships  . Social Herbalist on phone: Not on file    Gets together: Not on file    Attends religious service: Not on file    Active member of club or organization: Not on file    Attends meetings of clubs or organizations: Not on file    Relationship status: Not on file  . Intimate partner violence    Fear of current or ex partner: Not on file    Emotionally abused: Not on file    Physically abused: Not on file    Forced sexual activity: Not on file  Other Topics Concern  . Not on file  Social History Narrative   Lives with daughter in [place]:   03/30/19 1326  BP: 113/65  Pulse: 84  Temp: 97.8 F (36.6 C)  TempSrc: Temporal  SpO2: 96%  Weight: 118 lb (53.5 kg)  Height: 5' 6.5" (1.689 m)    Wt Readings from Last 3 Encounters:  03/30/19 118 lb (53.5 kg)  03/25/19 122 lb 9.2 oz (55.6 kg)  02/16/19 124 lb (56.2 kg)     PHYSICAL EXAM General: NAD HEENT: Normal. Neck: No JVD, no thyromegaly. Lungs: Clear to auscultation bilaterally with normal respiratory effort. CV: Regular rate and irregular rhythm, normal S1/S2, no S3, no murmur. No pretibial or periankle edema.  No carotid bruit.   Abdomen: Soft, nontender, no distention.  Neurologic: Alert and oriented.  Psych: Normal affect. Skin: Normal. Musculoskeletal: No gross deformities.      Labs: Lab Results  Component Value Date/Time    K 3.5 03/27/2019 05:53 AM   BUN 6 (L) 03/27/2019 05:53 AM   CREATININE 0.52 03/27/2019 05:53 AM   ALT 28 03/26/2019 04:38 AM   TSH 1.464 03/22/2019 12:48 PM   HGB 10.7 (L) 03/27/2019 05:53 AM     Lipids: No results found for: LDLCALC, LDLDIRECT, CHOL, TRIG, HDL    Echocardiogram 02/22/18:  - Left ventricle: The cavity size was normal. Wall thickness was normal. Systolic function was mildly reduced. The estimated ejection fraction was in the range of 45% to 50%.  Diffuse hypokinesis. Abnormal global longitudinal strain of -12.2%. The study was not technically sufficient to allow evaluation of LV diastolic dysfunction due to atrial fibrillation. - Aortic valve: Mildly calcified annulus. Trileaflet. - Mitral valve: Mildly calcified annulus. There was mild regurgitation. - Right ventricle: Pacer wire or catheter noted in right ventricle. - Right atrium: Pacer wire or catheter noted in right atrium. - Atrial septum: No defect or patent foramen ovale was identified. - Tricuspid valve: There was moderate regurgitation. - Pulmonary arteries: PA peak pressure: 35 mm Hg (S). - Pericardium, extracardiac: There was no pericardial effusion.   ASSESSMENT AND PLAN:  1. Coronary artery disease with three-vessel CABG: Symptomatically stable. Currently on metoprolol tartrate 100 mg twice daily and Lipitor. LVEF 45-50%.  She would like to come off of warfarin so I will start aspirin 325 mg daily.  2. Permanent atrial fibrillation: Symptomatically stable on metoprolol tartrate 100 mg twice daily. She would like to come off of warfarin so I will start aspirin 325 mg daily. I asked her to inform her gastroenterologist and myself if she were to have a recurrent GI bleed.  3. Mild aortic dilatation: Normal Ao root size by echo in 02/2018.  4. Pacemaker: Followed by EP. She underwent generator change in January 2019. She has a Chiropractor initially implanted in  2008.  5.Cardiomyopathy/chronic systolic heart failure: She is symptomatically stable and not in need of diuretics. She is no longer on Toprol-XL as he was switched to Lopressor 100 mg twice daily while hospitalized earlier this month.  She had been on benazepril in the past. No changes to therapy. LVEF 45-50%.    Disposition: Follow up 4 months   Kate Sable, M.D., F.A.C.C.

## 2019-03-30 NOTE — Patient Instructions (Signed)
Medication Instructions:  STOP WARFARIN  START ASPIRIN 325 MG DAILY   Labwork: NONE  Testing/Procedures: NONE  Follow-Up: Your physician recommends that you schedule a follow-up appointment in: 4 MONTHS    Any Other Special Instructions Will Be Listed Below (If Applicable).  PLEASE CALL GI DOCTOR & LET us KNOW IF YOU NOTICE ANY BLEEDING FROM STOOL    If you need a refill on your cardiac medications before your next appointment, please call your pharmacy.

## 2019-03-30 NOTE — Telephone Encounter (Signed)
Pt takes warfarin for afib with CHADS2VASc score of 5 (age x2, sex, CHF, CAD). Ok to hold warfarin for 5 days prior to endoscopy.

## 2019-04-02 ENCOUNTER — Encounter: Payer: Self-pay | Admitting: Internal Medicine

## 2019-04-02 NOTE — Telephone Encounter (Signed)
Pt takes warfarin for afib with CHADS2VASc score of 5 (age x2, sex, CHF, CAD). Ok to hold warfarin for 5 days prior to endoscopy.

## 2019-04-02 NOTE — Op Note (Signed)
[facility] Patient Name: Angelica Hernandez Procedure Date: 03/26/2019 3:21 PM MRN: [id number] Date of Birth: December 09, 1933 Attending MD: Norvel Richards , MD CSN: [id number] Age: 85 Admit Type: Inpatient Procedure:                Upper GI endoscopy Indications:              Heme positive stool Providers:                Norvel Richards, MD, Janeece Riggers, RN, Nelma Rothman, Technician Referring MD:              Medicines:                Propofol per Anesthesia Complications:            No immediate complications. Estimated Blood Loss:     Estimated blood loss was minimal. Procedure:                Pre-Anesthesia Assessment:                           - Prior to the procedure, a History and Physical                            was performed, and patient medications and                            allergies were reviewed. The patient's tolerance of                            previous anesthesia was also reviewed. The risks                            and benefits of the procedure and the sedation                            options and risks were discussed with the patient.                            All questions were answered, and informed consent                            was obtained. Prior Anticoagulants: The patient                            last took Coumadin (warfarin) 5 days prior to the                            procedure. ASA Grade Assessment: III - A patient                            with severe systemic disease. After reviewing the  risks and benefits, the patient was deemed in                            satisfactory condition to undergo the procedure.                           After obtaining informed consent, the endoscope was                            passed under direct vision. Throughout the                            procedure, the patient's blood pressure, pulse, and                            oxygen  saturations were monitored continuously. The                            GIF-H190 [id number]) was introduced through the                            mouth, and advanced to the third part of duodenum. Scope In: 3:35:27 PM Scope Out: 3:43:30 PM Total Procedure Duration: 0 hours 8 minutes 3 seconds  Findings:      A small hiatal hernia was present.      A non-obstructing and mild Schatzki ring was found at the       gastroesophageal junction.      Diffuse mucosal changes were found in the stomach. Some snakeskin/fish       scale appearance of the gastric mucosa diffusely. No ulcer or       infitrating process. Patent pylorus..      Patchy moderately erythematous mucosa was found in the entire examined       stomach. No ulcer or infiltrating process. Pylorus patent.      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 AVM's ablated with Gold probe at 20 J /       application. Finally, biopsies of the abnormal gstric mucosa were taken       with a cold forceps for histology. Estimated blood loss was minimal. Impression:               - Mild Schatzki ring-not manipulated                           - Small hiatal hernia.                           - Abnormal gastric mucolas of uncertain                            significance-biopsied.                           - Duodenal AVMs ablated.                           -  Gastric biopsies were taken with a cold forceps                            for histology. Moderate Sedation:      Moderate (conscious) sedation was personally administered by an       anesthesia professional. The following parameters were monitored: oxygen       saturation, heart rate, blood pressure, respiratory rate, EKG, adequacy       of pulmonary ventilation, and response to care. Recommendation:           - Return patient to ICU for ongoing care.                           - Advance diet as tolerated.                           -  Continue present medications. See colonoscopy                            report. Follow-up on pathology. See colonoscopy                            report. Procedure Code(s):        --- Professional ---                           [phone], Esophagogastroduodenoscopy, flexible,                            transoral; with biopsy, single or multiple Diagnosis Code(s):        --- Professional ---                           J39.2, Other diseases of pharynx                           K25.9, Gastric ulcer, unspecified as acute or                            chronic, without hemorrhage or perforation                           K44.9, Diaphragmatic hernia without obstruction or                            gangrene                           K22.2, Esophageal obstruction                           R19.5, Other fecal abnormalities CPT copyright 2019 American Medical Association. All rights reserved. The codes documented in this report are preliminary and upon coder review may  be revised to meet current compliance requirements. [name]. [name], MD Norvel Richards, MD 04/02/2019 11:50:14 AM This report has been signed electronically. Number of Addenda: 0

## 2019-04-03 NOTE — Telephone Encounter (Signed)
Called and notified the pt of the ok to hold coumadin 5 days prior to the procedure and was advised she is no longer taking the coumadin at this time.  She will call if she is put back on it prior to the procedure.

## 2019-04-04 ENCOUNTER — Telehealth: Payer: Self-pay | Admitting: Internal Medicine

## 2019-04-04 ENCOUNTER — Other Ambulatory Visit: Payer: Self-pay

## 2019-04-04 ENCOUNTER — Telehealth: Payer: Self-pay | Admitting: *Deleted

## 2019-04-04 ENCOUNTER — Encounter (HOSPITAL_COMMUNITY): Payer: Self-pay | Admitting: Internal Medicine

## 2019-04-04 DIAGNOSIS — R791 Abnormal coagulation profile: Secondary | ICD-10-CM

## 2019-04-04 DIAGNOSIS — R7989 Other specified abnormal findings of blood chemistry: Secondary | ICD-10-CM

## 2019-04-04 NOTE — Telephone Encounter (Signed)
Pt's daughter called to let us know that the patient's prescription hasn't been called in yet. She doesn't know the name of the medication. Patient uses [facility] in [place]

## 2019-04-04 NOTE — Telephone Encounter (Signed)
Spoke with pt's daughter. Prev pac or generic x 14 days was called into [facility]. Pt is to hold Lipitor while on medication. Pt's daughter is aware.

## 2019-04-04 NOTE — Telephone Encounter (Signed)
Pt's daughter said RX at [facility] in [place] was too expensive.  Can we send something cheaper?  Says her mom is having trouble keeping anything down.  [phone]

## 2019-04-05 NOTE — Telephone Encounter (Signed)
I called [facility] and spoke to the pharmacist, Threasa Beards. She is aware that Dr. Gala Romney is OK to fill as the 3 individual prescriptions and she will do so. I have informed the pt's daughter, Judson Roch.

## 2019-04-09 ENCOUNTER — Telehealth: Payer: Self-pay

## 2019-04-09 DIAGNOSIS — R791 Abnormal coagulation profile: Secondary | ICD-10-CM | POA: Diagnosis not present

## 2019-04-09 DIAGNOSIS — D649 Anemia, unspecified: Secondary | ICD-10-CM | POA: Diagnosis not present

## 2019-04-09 DIAGNOSIS — R7989 Other specified abnormal findings of blood chemistry: Secondary | ICD-10-CM | POA: Diagnosis not present

## 2019-04-09 NOTE — Telephone Encounter (Signed)
I would have her follow-up with her PCP regarding her urine.

## 2019-04-09 NOTE — Telephone Encounter (Signed)
Is she having any abdominal pain, nausea, vomiting, fever, or chills? Is she drinking plenty of water?

## 2019-04-09 NOTE — Telephone Encounter (Signed)
Spoke with pt's daughter. Pt isn't having abdominal pain, nausea, fever or chills. When pt first came home from the hospital, pt wasn't able to keep anything down. Pt isn't currently having those symptoms.  Pt is drinking water all throughout the day.

## 2019-04-09 NOTE — Telephone Encounter (Signed)
Spoke with pt's daughter. She is going to pick a copy of the lab orders needed for her mother and she is going to discuss the urine with pt's pcp

## 2019-04-09 NOTE — Telephone Encounter (Signed)
Pt's daughter called. Pt was seen at the hospital.  She is going to have her mothers lab work drawn. Judson Roch pt's daughter forgot to mention that her mothers urine is a Photographer. The color has been orangey since before pt was in the hospital.  She isn't sure if she should have her urine tested when she has her blood drawn (CBC, HFP, PT/INR)? Please advise.

## 2019-04-10 LAB — CBC WITH DIFFERENTIAL/PLATELET
Absolute Monocytes: 855 cells/uL (ref 200–950)
Basophils Absolute: 56 cells/uL (ref 0–200)
Basophils Relative: 0.6 %
Eosinophils Absolute: 216 cells/uL (ref 15–500)
Eosinophils Relative: 2.3 %
HCT: 39.8 % (ref 35.0–45.0)
Hemoglobin: 13.1 g/dL (ref 11.7–15.5)
Lymphs Abs: 1372 cells/uL (ref 850–3900)
MCH: 28.9 pg (ref 27.0–33.0)
MCHC: 32.9 g/dL (ref 32.0–36.0)
MCV: 87.9 fL (ref 80.0–100.0)
MPV: 11.4 fL (ref 7.5–12.5)
Monocytes Relative: 9.1 %
Neutro Abs: 6900 cells/uL (ref 1500–7800)
Neutrophils Relative %: 73.4 %
Platelets: 454 10*3/uL — ABNORMAL HIGH (ref 140–400)
RBC: 4.53 10*6/uL (ref 3.80–5.10)
RDW: 13.8 % (ref 11.0–15.0)
Total Lymphocyte: 14.6 %
WBC: 9.4 10*3/uL (ref 3.8–10.8)

## 2019-04-10 LAB — PROTIME-INR
INR: 1.2 — ABNORMAL HIGH
Prothrombin Time: 12.4 s — ABNORMAL HIGH (ref 9.0–11.5)

## 2019-04-10 LAB — HEPATIC FUNCTION PANEL
AG Ratio: 1.4 (calc) (ref 1.0–2.5)
ALT: 133 U/L — ABNORMAL HIGH (ref 6–29)
AST: 202 U/L — ABNORMAL HIGH (ref 10–35)
Albumin: 3.9 g/dL (ref 3.6–5.1)
Alkaline phosphatase (APISO): 461 U/L — ABNORMAL HIGH (ref 37–153)
Bilirubin, Direct: 6.1 mg/dL — ABNORMAL HIGH (ref 0.0–0.2)
Globulin: 2.7 g/dL (calc) (ref 1.9–3.7)
Indirect Bilirubin: 3.9 mg/dL (calc) — ABNORMAL HIGH (ref 0.2–1.2)
Total Bilirubin: 10 mg/dL — ABNORMAL HIGH (ref 0.2–1.2)
Total Protein: 6.6 g/dL (ref 6.1–8.1)

## 2019-04-11 ENCOUNTER — Other Ambulatory Visit: Payer: Self-pay

## 2019-04-11 ENCOUNTER — Inpatient Hospital Stay (HOSPITAL_COMMUNITY)
Admission: EM | Admit: 2019-04-11 | Discharge: 2019-04-18 | DRG: 444 | Disposition: A | Discharge status: Home / Self Care | Payer: Medicare Other | Attending: Internal Medicine | Admitting: Internal Medicine

## 2019-04-11 ENCOUNTER — Encounter (HOSPITAL_COMMUNITY): Payer: Self-pay

## 2019-04-11 DIAGNOSIS — I4821 Permanent atrial fibrillation: Secondary | ICD-10-CM | POA: Diagnosis present

## 2019-04-11 DIAGNOSIS — K8071 Calculus of gallbladder and bile duct without cholecystitis with obstruction: Principal | ICD-10-CM | POA: Diagnosis present

## 2019-04-11 DIAGNOSIS — Z833 Family history of diabetes mellitus: Secondary | ICD-10-CM

## 2019-04-11 DIAGNOSIS — R945 Abnormal results of liver function studies: Secondary | ICD-10-CM | POA: Diagnosis present

## 2019-04-11 DIAGNOSIS — E039 Hypothyroidism, unspecified: Secondary | ICD-10-CM | POA: Diagnosis present

## 2019-04-11 DIAGNOSIS — Z9911 Dependence on respirator [ventilator] status: Secondary | ICD-10-CM | POA: Diagnosis not present

## 2019-04-11 DIAGNOSIS — Z8249 Family history of ischemic heart disease and other diseases of the circulatory system: Secondary | ICD-10-CM

## 2019-04-11 DIAGNOSIS — I77810 Thoracic aortic ectasia: Secondary | ICD-10-CM | POA: Diagnosis present

## 2019-04-11 DIAGNOSIS — F419 Anxiety disorder, unspecified: Secondary | ICD-10-CM | POA: Diagnosis present

## 2019-04-11 DIAGNOSIS — K269 Duodenal ulcer, unspecified as acute or chronic, without hemorrhage or perforation: Secondary | ICD-10-CM | POA: Diagnosis present

## 2019-04-11 DIAGNOSIS — J9691 Respiratory failure, unspecified with hypoxia: Secondary | ICD-10-CM | POA: Diagnosis not present

## 2019-04-11 DIAGNOSIS — Z9889 Other specified postprocedural states: Secondary | ICD-10-CM

## 2019-04-11 DIAGNOSIS — K5721 Diverticulitis of large intestine with perforation and abscess with bleeding: Secondary | ICD-10-CM | POA: Diagnosis not present

## 2019-04-11 DIAGNOSIS — K8020 Calculus of gallbladder without cholecystitis without obstruction: Secondary | ICD-10-CM

## 2019-04-11 DIAGNOSIS — Z7901 Long term (current) use of anticoagulants: Secondary | ICD-10-CM

## 2019-04-11 DIAGNOSIS — Z978 Presence of other specified devices: Secondary | ICD-10-CM

## 2019-04-11 DIAGNOSIS — Z0189 Encounter for other specified special examinations: Secondary | ICD-10-CM

## 2019-04-11 DIAGNOSIS — K31819 Angiodysplasia of stomach and duodenum without bleeding: Secondary | ICD-10-CM | POA: Diagnosis present

## 2019-04-11 DIAGNOSIS — I255 Ischemic cardiomyopathy: Secondary | ICD-10-CM | POA: Diagnosis present

## 2019-04-11 DIAGNOSIS — K828 Other specified diseases of gallbladder: Secondary | ICD-10-CM | POA: Diagnosis present

## 2019-04-11 DIAGNOSIS — F332 Major depressive disorder, recurrent severe without psychotic features: Secondary | ICD-10-CM | POA: Diagnosis present

## 2019-04-11 DIAGNOSIS — I2510 Atherosclerotic heart disease of native coronary artery without angina pectoris: Secondary | ICD-10-CM | POA: Diagnosis present

## 2019-04-11 DIAGNOSIS — D649 Anemia, unspecified: Secondary | ICD-10-CM | POA: Diagnosis not present

## 2019-04-11 DIAGNOSIS — K839 Disease of biliary tract, unspecified: Secondary | ICD-10-CM | POA: Diagnosis not present

## 2019-04-11 DIAGNOSIS — K265 Chronic or unspecified duodenal ulcer with perforation: Secondary | ICD-10-CM | POA: Diagnosis present

## 2019-04-11 DIAGNOSIS — K8050 Calculus of bile duct without cholangitis or cholecystitis without obstruction: Secondary | ICD-10-CM | POA: Diagnosis not present

## 2019-04-11 DIAGNOSIS — Z7982 Long term (current) use of aspirin: Secondary | ICD-10-CM | POA: Diagnosis not present

## 2019-04-11 DIAGNOSIS — Z951 Presence of aortocoronary bypass graft: Secondary | ICD-10-CM

## 2019-04-11 DIAGNOSIS — R748 Abnormal levels of other serum enzymes: Secondary | ICD-10-CM | POA: Diagnosis not present

## 2019-04-11 DIAGNOSIS — I420 Dilated cardiomyopathy: Secondary | ICD-10-CM | POA: Diagnosis not present

## 2019-04-11 DIAGNOSIS — I130 Hypertensive heart and chronic kidney disease with heart failure and stage 1 through stage 4 chronic kidney disease, or unspecified chronic kidney disease: Secondary | ICD-10-CM | POA: Diagnosis present

## 2019-04-11 DIAGNOSIS — R54 Age-related physical debility: Secondary | ICD-10-CM | POA: Diagnosis present

## 2019-04-11 DIAGNOSIS — K8051 Calculus of bile duct without cholangitis or cholecystitis with obstruction: Secondary | ICD-10-CM | POA: Diagnosis not present

## 2019-04-11 DIAGNOSIS — K449 Diaphragmatic hernia without obstruction or gangrene: Secondary | ICD-10-CM | POA: Diagnosis present

## 2019-04-11 DIAGNOSIS — I495 Sick sinus syndrome: Secondary | ICD-10-CM | POA: Diagnosis present

## 2019-04-11 DIAGNOSIS — R17 Unspecified jaundice: Secondary | ICD-10-CM | POA: Diagnosis not present

## 2019-04-11 DIAGNOSIS — N189 Chronic kidney disease, unspecified: Secondary | ICD-10-CM | POA: Diagnosis present

## 2019-04-11 DIAGNOSIS — K832 Perforation of bile duct: Secondary | ICD-10-CM | POA: Diagnosis not present

## 2019-04-11 DIAGNOSIS — I4891 Unspecified atrial fibrillation: Secondary | ICD-10-CM | POA: Diagnosis present

## 2019-04-11 DIAGNOSIS — K869 Disease of pancreas, unspecified: Secondary | ICD-10-CM | POA: Diagnosis present

## 2019-04-11 DIAGNOSIS — R7401 Elevation of levels of liver transaminase levels: Secondary | ICD-10-CM

## 2019-04-11 DIAGNOSIS — Z79890 Hormone replacement therapy (postmenopausal): Secondary | ICD-10-CM

## 2019-04-11 DIAGNOSIS — K922 Gastrointestinal hemorrhage, unspecified: Secondary | ICD-10-CM | POA: Diagnosis not present

## 2019-04-11 DIAGNOSIS — K631 Perforation of intestine (nontraumatic): Secondary | ICD-10-CM

## 2019-04-11 DIAGNOSIS — I5022 Chronic systolic (congestive) heart failure: Secondary | ICD-10-CM | POA: Diagnosis not present

## 2019-04-11 DIAGNOSIS — E440 Moderate protein-calorie malnutrition: Secondary | ICD-10-CM | POA: Diagnosis not present

## 2019-04-11 DIAGNOSIS — Z20828 Contact with and (suspected) exposure to other viral communicable diseases: Secondary | ICD-10-CM | POA: Diagnosis present

## 2019-04-11 DIAGNOSIS — E876 Hypokalemia: Secondary | ICD-10-CM | POA: Diagnosis not present

## 2019-04-11 DIAGNOSIS — K219 Gastro-esophageal reflux disease without esophagitis: Secondary | ICD-10-CM | POA: Diagnosis present

## 2019-04-11 DIAGNOSIS — Z950 Presence of cardiac pacemaker: Secondary | ICD-10-CM

## 2019-04-11 DIAGNOSIS — K838 Other specified diseases of biliary tract: Secondary | ICD-10-CM | POA: Diagnosis not present

## 2019-04-11 DIAGNOSIS — R932 Abnormal findings on diagnostic imaging of liver and biliary tract: Secondary | ICD-10-CM | POA: Diagnosis not present

## 2019-04-11 DIAGNOSIS — Z03818 Encounter for observation for suspected exposure to other biological agents ruled out: Secondary | ICD-10-CM | POA: Diagnosis not present

## 2019-04-11 DIAGNOSIS — K2970 Gastritis, unspecified, without bleeding: Secondary | ICD-10-CM | POA: Diagnosis present

## 2019-04-11 DIAGNOSIS — Z79899 Other long term (current) drug therapy: Secondary | ICD-10-CM

## 2019-04-11 DIAGNOSIS — E861 Hypovolemia: Secondary | ICD-10-CM | POA: Diagnosis present

## 2019-04-11 DIAGNOSIS — K831 Obstruction of bile duct: Secondary | ICD-10-CM

## 2019-04-11 DIAGNOSIS — F17210 Nicotine dependence, cigarettes, uncomplicated: Secondary | ICD-10-CM | POA: Diagnosis present

## 2019-04-11 LAB — COMPREHENSIVE METABOLIC PANEL
ALT: 131 U/L — ABNORMAL HIGH (ref 0–44)
AST: 211 U/L — ABNORMAL HIGH (ref 15–41)
Albumin: 2.8 g/dL — ABNORMAL LOW (ref 3.5–5.0)
Alkaline Phosphatase: 469 U/L — ABNORMAL HIGH (ref 38–126)
Anion gap: 14 (ref 5–15)
BUN: 10 mg/dL (ref 8–23)
CO2: 26 mmol/L (ref 22–32)
Calcium: 8.9 mg/dL (ref 8.9–10.3)
Chloride: 98 mmol/L (ref 98–111)
Creatinine, Ser: 0.8 mg/dL (ref 0.44–1.00)
GFR calc Af Amer: >60 mL/min (ref >60)
GFR calc non Af Amer: >60 mL/min (ref >60)
Glucose, Bld: 134 mg/dL — ABNORMAL HIGH (ref 70–99)
Potassium: 3.3 mmol/L — ABNORMAL LOW (ref 3.5–5.1)
Sodium: 138 mmol/L (ref 135–145)
Total Bilirubin: 9.5 mg/dL — ABNORMAL HIGH (ref 0.3–1.2)
Total Protein: 6.4 g/dL — ABNORMAL LOW (ref 6.5–8.1)

## 2019-04-11 LAB — CBC
HCT: 37.9 % (ref 36.0–46.0)
Hemoglobin: 12.3 g/dL (ref 12.0–15.0)
MCH: 29.3 pg (ref 26.0–34.0)
MCHC: 32.5 g/dL (ref 30.0–36.0)
MCV: 90.2 fL (ref 80.0–100.0)
Platelets: 460 10*3/uL — ABNORMAL HIGH (ref 150–400)
RBC: 4.2 MIL/uL (ref 3.87–5.11)
RDW: 15.3 % (ref 11.5–15.5)
WBC: 9.3 10*3/uL (ref 4.0–10.5)
nRBC: 0 % (ref 0.0–0.2)

## 2019-04-11 NOTE — ED Triage Notes (Signed)
Pt here for jaundice.  PCP office called pt this morning and referred to ED d/t elevated LFT labs.  Daughter noticed pt had yellowing skin this morning.  Pt face, chest, abd yellow.

## 2019-04-12 ENCOUNTER — Encounter (HOSPITAL_COMMUNITY): Payer: Self-pay | Admitting: Family Medicine

## 2019-04-12 ENCOUNTER — Emergency Department (HOSPITAL_COMMUNITY): Payer: Medicare Other

## 2019-04-12 ENCOUNTER — Other Ambulatory Visit: Payer: Self-pay

## 2019-04-12 DIAGNOSIS — R945 Abnormal results of liver function studies: Secondary | ICD-10-CM | POA: Diagnosis not present

## 2019-04-12 DIAGNOSIS — K631 Perforation of intestine (nontraumatic): Secondary | ICD-10-CM | POA: Diagnosis not present

## 2019-04-12 DIAGNOSIS — K3189 Other diseases of stomach and duodenum: Secondary | ICD-10-CM | POA: Diagnosis not present

## 2019-04-12 DIAGNOSIS — Z833 Family history of diabetes mellitus: Secondary | ICD-10-CM | POA: Diagnosis not present

## 2019-04-12 DIAGNOSIS — E876 Hypokalemia: Secondary | ICD-10-CM | POA: Diagnosis not present

## 2019-04-12 DIAGNOSIS — E039 Hypothyroidism, unspecified: Secondary | ICD-10-CM | POA: Diagnosis present

## 2019-04-12 DIAGNOSIS — R748 Abnormal levels of other serum enzymes: Secondary | ICD-10-CM | POA: Diagnosis not present

## 2019-04-12 DIAGNOSIS — E440 Moderate protein-calorie malnutrition: Secondary | ICD-10-CM | POA: Diagnosis present

## 2019-04-12 DIAGNOSIS — F332 Major depressive disorder, recurrent severe without psychotic features: Secondary | ICD-10-CM | POA: Diagnosis present

## 2019-04-12 DIAGNOSIS — K8071 Calculus of gallbladder and bile duct without cholecystitis with obstruction: Secondary | ICD-10-CM | POA: Diagnosis not present

## 2019-04-12 DIAGNOSIS — R17 Unspecified jaundice: Secondary | ICD-10-CM

## 2019-04-12 DIAGNOSIS — K838 Other specified diseases of biliary tract: Secondary | ICD-10-CM

## 2019-04-12 DIAGNOSIS — Z9911 Dependence on respirator [ventilator] status: Secondary | ICD-10-CM | POA: Diagnosis not present

## 2019-04-12 DIAGNOSIS — K760 Fatty (change of) liver, not elsewhere classified: Secondary | ICD-10-CM | POA: Diagnosis not present

## 2019-04-12 DIAGNOSIS — I420 Dilated cardiomyopathy: Secondary | ICD-10-CM | POA: Diagnosis not present

## 2019-04-12 DIAGNOSIS — Z4682 Encounter for fitting and adjustment of non-vascular catheter: Secondary | ICD-10-CM | POA: Diagnosis not present

## 2019-04-12 DIAGNOSIS — I4821 Permanent atrial fibrillation: Secondary | ICD-10-CM | POA: Diagnosis not present

## 2019-04-12 DIAGNOSIS — F419 Anxiety disorder, unspecified: Secondary | ICD-10-CM | POA: Diagnosis present

## 2019-04-12 DIAGNOSIS — I4891 Unspecified atrial fibrillation: Secondary | ICD-10-CM | POA: Diagnosis not present

## 2019-04-12 DIAGNOSIS — K832 Perforation of bile duct: Secondary | ICD-10-CM | POA: Diagnosis not present

## 2019-04-12 DIAGNOSIS — K269 Duodenal ulcer, unspecified as acute or chronic, without hemorrhage or perforation: Secondary | ICD-10-CM | POA: Diagnosis not present

## 2019-04-12 DIAGNOSIS — K261 Acute duodenal ulcer with perforation: Secondary | ICD-10-CM | POA: Diagnosis not present

## 2019-04-12 DIAGNOSIS — Z7901 Long term (current) use of anticoagulants: Secondary | ICD-10-CM | POA: Diagnosis not present

## 2019-04-12 DIAGNOSIS — N189 Chronic kidney disease, unspecified: Secondary | ICD-10-CM | POA: Diagnosis not present

## 2019-04-12 DIAGNOSIS — K219 Gastro-esophageal reflux disease without esophagitis: Secondary | ICD-10-CM | POA: Diagnosis present

## 2019-04-12 DIAGNOSIS — K5721 Diverticulitis of large intestine with perforation and abscess with bleeding: Secondary | ICD-10-CM | POA: Diagnosis not present

## 2019-04-12 DIAGNOSIS — I2510 Atherosclerotic heart disease of native coronary artery without angina pectoris: Secondary | ICD-10-CM | POA: Diagnosis not present

## 2019-04-12 DIAGNOSIS — Z79890 Hormone replacement therapy (postmenopausal): Secondary | ICD-10-CM | POA: Diagnosis not present

## 2019-04-12 DIAGNOSIS — I130 Hypertensive heart and chronic kidney disease with heart failure and stage 1 through stage 4 chronic kidney disease, or unspecified chronic kidney disease: Secondary | ICD-10-CM | POA: Diagnosis present

## 2019-04-12 DIAGNOSIS — Z48815 Encounter for surgical aftercare following surgery on the digestive system: Secondary | ICD-10-CM | POA: Diagnosis not present

## 2019-04-12 DIAGNOSIS — J9691 Respiratory failure, unspecified with hypoxia: Secondary | ICD-10-CM | POA: Diagnosis not present

## 2019-04-12 DIAGNOSIS — K828 Other specified diseases of gallbladder: Secondary | ICD-10-CM | POA: Diagnosis not present

## 2019-04-12 DIAGNOSIS — R932 Abnormal findings on diagnostic imaging of liver and biliary tract: Secondary | ICD-10-CM | POA: Diagnosis not present

## 2019-04-12 DIAGNOSIS — Z8249 Family history of ischemic heart disease and other diseases of the circulatory system: Secondary | ICD-10-CM | POA: Diagnosis not present

## 2019-04-12 DIAGNOSIS — K8051 Calculus of bile duct without cholangitis or cholecystitis with obstruction: Secondary | ICD-10-CM | POA: Diagnosis not present

## 2019-04-12 DIAGNOSIS — Z951 Presence of aortocoronary bypass graft: Secondary | ICD-10-CM | POA: Diagnosis not present

## 2019-04-12 DIAGNOSIS — K449 Diaphragmatic hernia without obstruction or gangrene: Secondary | ICD-10-CM | POA: Diagnosis not present

## 2019-04-12 DIAGNOSIS — K8050 Calculus of bile duct without cholangitis or cholecystitis without obstruction: Secondary | ICD-10-CM | POA: Diagnosis not present

## 2019-04-12 DIAGNOSIS — Z20828 Contact with and (suspected) exposure to other viral communicable diseases: Secondary | ICD-10-CM | POA: Diagnosis present

## 2019-04-12 DIAGNOSIS — K839 Disease of biliary tract, unspecified: Secondary | ICD-10-CM | POA: Diagnosis not present

## 2019-04-12 DIAGNOSIS — K265 Chronic or unspecified duodenal ulcer with perforation: Secondary | ICD-10-CM | POA: Diagnosis not present

## 2019-04-12 DIAGNOSIS — I5022 Chronic systolic (congestive) heart failure: Secondary | ICD-10-CM | POA: Diagnosis present

## 2019-04-12 DIAGNOSIS — K2950 Unspecified chronic gastritis without bleeding: Secondary | ICD-10-CM | POA: Diagnosis not present

## 2019-04-12 DIAGNOSIS — I255 Ischemic cardiomyopathy: Secondary | ICD-10-CM | POA: Diagnosis present

## 2019-04-12 DIAGNOSIS — D649 Anemia, unspecified: Secondary | ICD-10-CM | POA: Diagnosis not present

## 2019-04-12 DIAGNOSIS — Z7982 Long term (current) use of aspirin: Secondary | ICD-10-CM | POA: Diagnosis not present

## 2019-04-12 DIAGNOSIS — K831 Obstruction of bile duct: Secondary | ICD-10-CM | POA: Diagnosis not present

## 2019-04-12 LAB — CBG MONITORING, ED: Glucose-Capillary: 93 mg/dL (ref 70–99)

## 2019-04-12 LAB — HEPATITIS PANEL, ACUTE
HCV Ab: NONREACTIVE
Hep A IgM: NONREACTIVE
Hep B C IgM: NONREACTIVE
Hepatitis B Surface Ag: NONREACTIVE

## 2019-04-12 LAB — BILIRUBIN, FRACTIONATED(TOT/DIR/INDIR)
Bilirubin, Direct: 5.5 mg/dL — ABNORMAL HIGH (ref 0.0–0.2)
Indirect Bilirubin: 3.4 mg/dL — ABNORMAL HIGH (ref 0.3–0.9)
Total Bilirubin: 8.9 mg/dL — ABNORMAL HIGH (ref 0.3–1.2)

## 2019-04-12 LAB — ACETAMINOPHEN LEVEL: Acetaminophen (Tylenol), Serum: <10 ug/mL — ABNORMAL LOW (ref 10–30)

## 2019-04-12 LAB — PROTIME-INR
INR: 1.2 (ref 0.8–1.2)
Prothrombin Time: 14.6 seconds (ref 11.4–15.2)

## 2019-04-12 LAB — SARS CORONAVIRUS 2 (TAT 6-24 HRS): SARS Coronavirus 2: NEGATIVE

## 2019-04-12 MED ORDER — SODIUM CHLORIDE 0.9% FLUSH
3.0000 mL | Freq: Two times a day (BID) | INTRAVENOUS | Status: DC
  Administered 2019-04-12 – 2019-04-17 (×7): 3 mL via INTRAVENOUS

## 2019-04-12 MED ORDER — ONDANSETRON HCL 4 MG/2ML IJ SOLN: 4.0000 mg | Freq: Four times a day (QID) | INTRAMUSCULAR | Status: DC | PRN

## 2019-04-12 MED ORDER — DILTIAZEM HCL 25 MG/5ML IV SOLN
15.0000 mg | INTRAVENOUS | Status: DC | PRN
  Administered 2019-04-12 (×3): 15 mg via INTRAVENOUS
  Filled 2019-04-12 (×5): qty 5

## 2019-04-12 MED ORDER — SODIUM CHLORIDE 0.9% FLUSH
3.0000 mL | Freq: Two times a day (BID) | INTRAVENOUS | Status: DC
  Administered 2019-04-12 – 2019-04-17 (×3): 3 mL via INTRAVENOUS

## 2019-04-12 MED ORDER — LEVOTHYROXINE SODIUM 50 MCG PO TABS
50.0000 ug | ORAL_TABLET | Freq: Every day | ORAL | Status: DC
  Administered 2019-04-12 – 2019-04-18 (×7): 50 ug via ORAL
  Filled 2019-04-12 (×7): qty 1

## 2019-04-12 MED ORDER — SODIUM CHLORIDE 0.9% FLUSH: 3.0000 mL | INTRAVENOUS | Status: DC | PRN

## 2019-04-12 MED ORDER — POTASSIUM CHLORIDE 10 MEQ/100ML IV SOLN
10.0000 meq | INTRAVENOUS | Status: AC
  Administered 2019-04-12 (×2): 10 meq via INTRAVENOUS
  Filled 2019-04-12 (×2): qty 100

## 2019-04-12 MED ORDER — SODIUM CHLORIDE 0.9 % IV SOLN
250.0000 mL | INTRAVENOUS | Status: DC | PRN
  Administered 2019-04-18: 250 mL via INTRAVENOUS

## 2019-04-12 MED ORDER — SODIUM CHLORIDE 0.9 % IV SOLN
INTRAVENOUS | Status: AC
  Administered 2019-04-12: 08:00:00 via INTRAVENOUS

## 2019-04-12 MED ORDER — SODIUM CHLORIDE 0.9 % IV BOLUS
1000.0000 mL | Freq: Once | INTRAVENOUS | Status: AC
  Administered 2019-04-12: 1000 mL via INTRAVENOUS

## 2019-04-12 MED ORDER — ONDANSETRON HCL 4 MG PO TABS: 4.0000 mg | ORAL_TABLET | Freq: Four times a day (QID) | ORAL | Status: DC | PRN

## 2019-04-12 MED ORDER — PANTOPRAZOLE SODIUM 40 MG PO TBEC
40.0000 mg | DELAYED_RELEASE_TABLET | Freq: Every day | ORAL | Status: DC
  Administered 2019-04-12 – 2019-04-14 (×3): 40 mg via ORAL
  Filled 2019-04-12 (×3): qty 1

## 2019-04-12 NOTE — ED Provider Notes (Addendum)
[facility] EMERGENCY DEPARTMENT Provider Note  CSN: [id number] Arrival date & time: 04/11/19 1509  Chief Complaint(s) Jaundice  HPI Angelica Hernandez is a 85 y.o. female with a past medical history listed below recently admitted to [facility] where she obtained a CT scan notable for biliary dilatation with elevated LFTs.  Patient did not undergo MRCP due to pacemaker.  She followed up with gastroenterology who performed endoscopy and colonoscopy.  Patient presents now for jaundice.  She denies any abdominal pain, altered mental status.  She does note inability to tolerate oral intake for the past week.  No chest pain or shortness of breath.  No fevers or chills.  No other physical complaints.  HPI  Past Medical History Past Medical History:  Diagnosis Date  . Anxiety   . CAD (coronary artery disease)   . CKD (chronic kidney disease)   . Gastro-esophageal reflux disease without esophagitis   . Hypothyroidism   . Ischemic cardiomyopathy   . Mild dilation of ascending aorta (HCC)   . Moderate protein-calorie malnutrition ([place])   . Permanent atrial fibrillation ([place])   . Severe episode of recurrent major depressive disorder, without psychotic features ([place])   . Symptomatic bradycardia    Patient Active Problem List   Diagnosis Date Noted  . Gastrointestinal hemorrhage   . Hypercoagulopathy ([place])   . Dilation of biliary tract   . Elevated LFTs   . Supratherapeutic INR 03/22/2019  . Acute anemia 03/22/2019  . CAD (coronary artery disease) 03/22/2019  . Atrial fibrillation with RVR ([place]) 03/22/2019  . Atrial fibrillation ([place]) 02/14/2018  . Encounter for therapeutic drug monitoring 02/14/2018  . Cardiac pacemaker in situ 01/19/2018   Home Medication(s) Prior to Admission medications   Medication Sig Start Date End Date Taking? Authorizing Provider  aspirin EC 325 MG tablet Take 1 tablet (325 mg total) by mouth daily. 03/30/19   Herminio Commons, MD   atorvastatin (LIPITOR) 10 MG tablet Take 10 mg by mouth daily.    [provider]  Cholecalciferol (VITAMIN D3) 1000 units CAPS Take 1,000 Units by mouth daily.    [provider]  DULoxetine (CYMBALTA) 30 MG capsule Take 1 capsule by mouth daily. 03/01/19   [provider]  FERREX 150 150 MG capsule Take 150 mg by mouth daily.  02/07/18   [provider]  gabapentin (NEURONTIN) 300 MG capsule Take 300 mg by mouth at bedtime. For pain/sleep 01/18/19   [provider]  levothyroxine (SYNTHROID, LEVOTHROID) 50 MCG tablet Take 50 mcg by mouth daily before breakfast.    [provider]  metoprolol tartrate (LOPRESSOR) 100 MG tablet Take 1 tablet (100 mg total) by mouth 2 (two) times daily. 03/27/19 04/26/19  Manuella Ghazi, Pratik D, DO  nitroGLYCERIN (NITROSTAT) 0.4 MG SL tablet Place 1 tablet (0.4 mg total) under the tongue every 5 (five) minutes as needed for chest pain. 09/18/18   Herminio Commons, MD  pantoprazole (PROTONIX) 40 MG tablet Take 1 tablet (40 mg total) by mouth daily. 03/27/19 03/26/20  Manuella Ghazi, Pratik D, DO  vitamin C (ASCORBIC ACID) 500 MG tablet Take 500 mg by mouth 2 (two) times daily.     [provider]  Past Surgical History Past Surgical History:  Procedure Laterality Date  . CESAREAN SECTION    . COLONOSCOPY N/A 03/26/2019   Procedure: COLONOSCOPY;  Surgeon: Daneil Dolin, MD;  [facility];  Service: Endoscopy;  Laterality: N/A;  . CORONARY ARTERY BYPASS GRAFT    . ESOPHAGOGASTRODUODENOSCOPY (EGD) WITH PROPOFOL N/A 03/26/2019   Procedure: ESOPHAGOGASTRODUODENOSCOPY (EGD) WITH PROPOFOL;  Surgeon: Daneil Dolin, MD;  Location[facility];  Service: Endoscopy;  Laterality: N/A;  . OTHER SURGICAL HISTORY     cesearan section   . PACEMAKER GENERATOR CHANGE  06/15/2017   [employer] Accolade SR L 300 ([place] [id number]) implanted by Dr Ernestina Patches at [facility] in [place] for symptomatic bradycardia  . PACEMAKER IMPLANT  11/16/2006   PPM implanted for bradycardia   Family History Family History  Problem Relation Age of Onset  . Heart disease Mother   . Hypertension Mother   . Diabetes Mother   . Hypertension Father   . Heart disease Father   . Hypertension Brother     Social History Social History   Tobacco Use  . Smoking status: Current Every Day Smoker    Packs/day: 0.50    Types: Cigarettes  . Smokeless tobacco: Never Used  Substance Use Topics  . Alcohol use: Never    Frequency: Never  . Drug use: Never   Allergies Patient has no known allergies.  Review of Systems Review of Systems All other systems are reviewed and are negative for acute change except as noted in the HPI  Physical Exam Vital Signs  I have reviewed the triage vital signs BP 127/73 (BP Location: Left Arm)   Pulse 133   Temp 98.3 F (36.8 C) (Oral)   Resp 17   SpO2 98%   Physical Exam Vitals signs reviewed.  Constitutional:      General: She is not in acute distress.    Appearance: She is well-developed. She is not diaphoretic.  HENT:     Head: Normocephalic and atraumatic.     Nose: Nose normal.  Eyes:     General: Scleral icterus present.        Right eye: No discharge.        Left eye: No discharge.     Conjunctiva/sclera: Conjunctivae normal.     Pupils: Pupils are equal, round, and reactive to light.  Neck:     Musculoskeletal: Normal range of motion and neck supple.  Cardiovascular:     Rate and Rhythm: Tachycardia present. Rhythm irregularly irregular.     Heart sounds: No murmur. No friction rub. No gallop.   Pulmonary:     Effort: Pulmonary effort is normal. No respiratory distress.     Breath sounds: Normal breath sounds. No stridor. No rales.  Chest:    Abdominal:     General: There is no distension.      Palpations: Abdomen is soft.     Tenderness: There is no abdominal tenderness.  Musculoskeletal:        General: No tenderness.  Skin:    General: Skin is warm and dry.     Coloration: Skin is jaundiced.     Findings: No erythema or rash.  Neurological:     Mental Status: She is alert and oriented to person, place, and time.     ED Results and Treatments Labs (all labs ordered are listed, but only abnormal results are displayed) Labs Reviewed  CBC - Abnormal; Notable for the following  components:      Result Value   Platelets 460 (*)    All other components within normal limits  COMPREHENSIVE METABOLIC PANEL - Abnormal; Notable for the following components:   Potassium 3.3 (*)    Glucose, Bld 134 (*)    Total Protein 6.4 (*)    Albumin 2.8 (*)    AST 211 (*)    ALT 131 (*)    Alkaline Phosphatase 469 (*)    Total Bilirubin 9.5 (*)    All other components within normal limits  PROTIME-INR  BILIRUBIN, FRACTIONATED(TOT/DIR/INDIR)  HEPATITIS PANEL, ACUTE  ACETAMINOPHEN LEVEL                                                                                                                         EKG  EKG Interpretation  Date/Time:  Thursday April 12 2019 05:36:04 EDT Ventricular Rate:  102 PR Interval:    QRS Duration: 94 QT Interval:  310 QTC Calculation: 404 R Axis:   66 Text Interpretation: Atrial fibrillation Abnormal R-wave progression, early transition Nonspecific repol abnormality, diffuse leads Confirmed by Addison Lank [phone]) on 04/12/2019 6:58:39 AM      Radiology US Abdomen Limited Ruq  Result Date: 04/12/2019 CLINICAL DATA:  85 year old female with jaundice. EXAM: ULTRASOUND ABDOMEN LIMITED RIGHT UPPER QUADRANT COMPARISON:  CT abdomen pelvis dated 03/23/2019 FINDINGS: Gallbladder: There is sludge within the gallbladder. The gallbladder is distended. No gallbladder wall thickening or pericholecystic fluid. Negative sonographic Murphy's sign. Common bile  duct: Diameter: The common bile duct is dilated measuring up to 14 mm. No shadowing stone noted in the visualized CBD. The central portion of the CBD at the head of the pancreas is not well visualized. Liver: The liver is grossly unremarkable as visualized. There is mild intrahepatic biliary ductal dilatation. Portal vein is patent on color Doppler imaging with normal direction of blood flow towards the liver. Other: There is a 13 mm right renal upper pole cyst. There is advanced atherosclerotic calcification of the aorta. IMPRESSION: 1. Distended gallbladder containing sludge. No sonographic findings of acute cholecystitis. 2. Dilated common bile duct. No stone identified in the visualized portion of the CBD. The central CBD at the head of the pancreas is suboptimally visualized. 3.  Aortic Atherosclerosis (ICD10-I70.0). Electronically Signed   By: Anner Crete M.D.   On: 04/12/2019 00:41    Pertinent labs & imaging results that were available during my care of the patient were reviewed by me and considered in my medical decision making (see chart for details).  Medications Ordered in ED Medications  sodium chloride 0.9 % bolus 1,000 mL (has no administration in time range)  Procedures .Critical Care Performed by: Fatima Blank, MD Authorized by: Fatima Blank, MD     CRITICAL CARE Performed by: Grayce Sessions Cameo Schmiesing Total critical care time: 30 minutes Critical care time was exclusive of separately billable procedures and treating other patients. Critical care was necessary to treat or prevent imminent or life-threatening deterioration. Critical care was time spent personally by me on the following activities: development of treatment plan with patient and/or surrogate as well as nursing, discussions with consultants, evaluation of patient's  response to treatment, examination of patient, obtaining history from patient or surrogate, ordering and performing treatments and interventions, ordering and review of laboratory studies, ordering and review of radiographic studies, pulse oximetry and re-evaluation of patient's condition.   (including critical care time)  Medical Decision Making / ED Course I have reviewed the nursing notes for this encounter and the patient's prior records (if available in EHR or on provided paperwork).   Steffanie Yearwood was evaluated in Emergency Department on 04/12/2019 for the symptoms described in the history of present illness. She was evaluated in the context of the global COVID-19 pandemic, which necessitated consideration that the patient might be at risk for infection with the SARS-CoV-2 virus that causes COVID-19. Institutional protocols and algorithms that pertain to the evaluation of patients at risk for COVID-19 are in a state of rapid change based on information released by regulatory bodies including the CDC and federal and state organizations. These policies and algorithms were followed during the patient's care in the ED.  Patient is here for jaundice.  Has worsening LFTs and total bili. Abdomen benign. No fevers. Not suspicious of cholangitis. Ultrasound without evidence of acute cholecystitis. Will need admission and GI consultation for likely ERCP.  Noted to be in [place]. Possible dehydration given lack of oral tolerance. Will provide with IVF and reassess.  HR improved with IVF.  Spoke with Dr. Myna Hidalgo from hospitalist service for admission.      Final Clinical Impression(s) / ED Diagnoses Final diagnoses:  Jaundice  Hyperbilirubinemia  Transaminitis  Atrial fibrillation with RVR ([place])      This chart was dictated using voice recognition software.  Despite best efforts to proofread,  errors can occur which can change the documentation meaning.     Fatima Blank, MD  04/12/19 [phone]

## 2019-04-12 NOTE — ED Notes (Signed)
Patient CBG was 93. 

## 2019-04-12 NOTE — Progress Notes (Signed)
Patient is 85 year old lady with history of CAD, CABG, symptomatic bradycardia with pacer, chronic systolic congestive heart failure who was recently hospitalized due to GI bleed where her anticoagulation was stopped.  She was found to have slightly elevated bilirubin with dilated CBD but no stone.  Reportedly, plan was to do EUS as outpatient by GI however her basic labs were repeated by her PCP yesterday and her bilirubin jumped to over 10 so she was advised to come to the emergency department.  Patient complains of generalized weakness but no other complaint.  Denies any chest pain, shortness of breath, fever, abdominal pain but does endorse having some nausea and intermittent vomiting since last 2 days.  On examination, she looks icteric.  Lungs clear to auscultation.  Abdomen soft and nontender.  Not in acute distress.  I have consulted [facility] to help manage her painless jaundice.

## 2019-04-12 NOTE — Progress Notes (Signed)
NEW ADMISSION NOTE New Admission Note:   Arrival Method: Patient arrived from ED Mental Orientation: Alert and oriented x 4 with forgetfulness Telemetry: 66M 13 paced. Assessment: Completed Skin:  Warm, dry and intact but Jaundiced. IV: Right AC NS 70 ml/hr. Pain: Denies any pain. Tubes: N/A Safety Measures: Safety Fall Prevention Plan has been given, discussed and signed Admission: Completed [facility] [place] Orientation: Patient has been orientated to the room, unit and staff.    Orders have been reviewed and implemented. Will continue to monitor the patient. Call light has been placed within reach and bed alarm has been activated.   Amaryllis Dyke, RN

## 2019-04-12 NOTE — ED Notes (Signed)
Daughter -  Angelica Hernandez [id number] is going home, will be back.

## 2019-04-12 NOTE — H&P (Signed)
History and Physical    Angelica Hernandez [id number] DOB: 11-Sep-1933 DOA: 04/11/2019  PCP: Caryl Bis, MD   Patient coming from: Home   Chief Complaint: Painless jaundice   HPI: Angelica Hernandez is a 85 y.o. female with medical history significant for CAD status post CABG, symptomatic bradycardia with pacer, chronic systolic CHF, atrial fibrillation recently off anticoagulation, hypothyroidism, and recent admission with GI bleed felt to be diverticular in the setting of supratherapeutic INR, now returning to the emergency department for evaluation of painless jaundice.  Patient was noted to have mild elevation in transaminases and total bilirubin of 2.0 during the recent hospitalization, and was also found to have biliary dilation on imaging.  There was tentative plans for outpatient endoscopic ultrasound for evaluation of the biliary dilation.  She has had a poor appetite since before the recent hospitalization, but denies any nausea, vomiting, or diarrhea.  She denies any abdominal pain.  She had blood work repeated by her PCP and was notified of significantly elevated LFTs.  Patient's daughter noted that the patient was jaundiced beginning yesterday.    ED Course: Upon arrival to the ED, patient is found to be afebrile, saturating well on room air, bradycardic, and with systolic blood pressure of 100.  Chemistry panel is notable for alkaline phosphatase 469, albumin 2.8, AST 211, ALT 131, and total bilirubin 9.5.  CBC features a mild thrombocytosis.  Right upper quadrant ultrasound reveals dilated CBD without visualization of a stone.  Patient was given a liter of normal saline in the ED.  COVID-19 testing has been ordered but not yet performed.  Review of Systems:  All other systems reviewed and apart from HPI, are negative.  Past Medical History:  Diagnosis Date  . Anxiety   . CAD (coronary artery disease)   . CKD (chronic kidney disease)   . Gastro-esophageal reflux disease without  esophagitis   . Hypothyroidism   . Ischemic cardiomyopathy   . Mild dilation of ascending aorta (HCC)   . Moderate protein-calorie malnutrition ([place])   . Permanent atrial fibrillation ([place])   . Severe episode of recurrent major depressive disorder, without psychotic features ([place])   . Symptomatic bradycardia     Past Surgical History:  Procedure Laterality Date  . CESAREAN SECTION    . COLONOSCOPY N/A 03/26/2019   Procedure: COLONOSCOPY;  Surgeon: Daneil Dolin, MD;  [facility];  Service: Endoscopy;  Laterality: N/A;  . CORONARY ARTERY BYPASS GRAFT    . ESOPHAGOGASTRODUODENOSCOPY (EGD) WITH PROPOFOL N/A 03/26/2019   Procedure: ESOPHAGOGASTRODUODENOSCOPY (EGD) WITH PROPOFOL;  Surgeon: Daneil Dolin, MD;  [facility];  Service: Endoscopy;  Laterality: N/A;  . OTHER SURGICAL HISTORY     cesearan section   . PACEMAKER GENERATOR CHANGE  06/15/2017   [employer] Accolade SR L 300 ([place] [id number]) implanted by Dr Ernestina Patches at [facility] in [place] for symptomatic bradycardia  . PACEMAKER IMPLANT  11/16/2006   PPM implanted for bradycardia     reports that she has been smoking cigarettes. She has been smoking about 0.50 packs per day. She has never used smokeless tobacco. She reports that she does not drink alcohol or use drugs.  No Known Allergies  Family History  Problem Relation Age of Onset  . Heart disease Mother   . Hypertension Mother   . Diabetes Mother   . Hypertension Father   . Heart disease Father   . Hypertension Brother      Prior  to Admission medications   Medication Sig Start Date End Date Taking? Authorizing Provider  aspirin EC 325 MG tablet Take 1 tablet (325 mg total) by mouth daily. 03/30/19   Herminio Commons, MD  atorvastatin (LIPITOR) 10 MG tablet Take 10 mg by mouth daily.    [provider]  Cholecalciferol (VITAMIN D3) 1000 units CAPS Take 1,000 Units by mouth daily.    [provider]  DULoxetine (CYMBALTA) 30 MG capsule Take 1 capsule by mouth daily. 03/01/19   [provider]  FERREX 150 150 MG capsule Take 150 mg by mouth daily.  02/07/18   [provider]  gabapentin (NEURONTIN) 300 MG capsule Take 300 mg by mouth at bedtime. For pain/sleep 01/18/19   [provider]  levothyroxine (SYNTHROID, LEVOTHROID) 50 MCG tablet Take 50 mcg by mouth daily before breakfast.    [provider]  metoprolol tartrate (LOPRESSOR) 100 MG tablet Take 1 tablet (100 mg total) by mouth 2 (two) times daily. 03/27/19 04/26/19  Manuella Ghazi, Pratik D, DO  nitroGLYCERIN (NITROSTAT) 0.4 MG SL tablet Place 1 tablet (0.4 mg total) under the tongue every 5 (five) minutes as needed for chest pain. 09/18/18   Herminio Commons, MD  pantoprazole (PROTONIX) 40 MG tablet Take 1 tablet (40 mg total) by mouth daily. 03/27/19 03/26/20  Manuella Ghazi, Pratik D, DO  vitamin C (ASCORBIC ACID) 500 MG tablet Take 500 mg by mouth 2 (two) times daily.     [provider]    Physical Exam: Vitals:   04/11/19 1524 04/11/19 2010 04/12/19 0348  BP: 101/61 108/77 127/73  Pulse: (!) 56 87 (!) 48  Resp: 16 18 17   Temp: 98 F (36.7 C) 98.5 F (36.9 C) 98.3 F (36.8 C)  TempSrc: Oral Oral Oral  SpO2: 99% 99% 98%    Constitutional: NAD, calm  Eyes: PERTLA, lids and conjunctivae normal ENMT: Mucous membranes are moist. Posterior pharynx clear of any exudate or lesions.   Neck: normal, supple, no masses, no thyromegaly Respiratory:  no wheezing, no crackles. Normal respiratory effort. No accessory muscle use.  Cardiovascular: Rate ~100 and irregularly irregular. No extremity edema.    Abdomen: No distension, no tenderness, soft. Bowel sounds active.  Musculoskeletal: no clubbing / cyanosis. No joint deformity upper and lower extremities.  Skin: Jaundiced. Warm, dry, well-perfused. Neurologic: No gross facial asymmetry. Sensation intact. Moving all extremities.  Psychiatric:   Alert and oriented to person, place, and situation. Very pleasant, cooperative.    Labs on Admission: I have personally reviewed following labs and imaging studies  CBC: Recent Labs  Lab 04/09/19 1625 04/11/19 1538  WBC 9.4 9.3  NEUTROABS 6,900  --   HGB 13.1 12.3  HCT 39.8 37.9  MCV 87.9 90.2  PLT 454* 123456*   Basic Metabolic Panel: Recent Labs  Lab 04/11/19 1538  NA 138  K 3.3*  CL 98  CO2 26  GLUCOSE 134*  BUN 10  CREATININE 0.80  CALCIUM 8.9   GFR: Estimated Creatinine Clearance: 45.8 mL/min (by C-G formula based on SCr of 0.8 mg/dL). Liver Function Tests: Recent Labs  Lab 04/09/19 1625 04/11/19 1538  AST 202* 211*  ALT 133* 131*  ALKPHOS  --  469*  BILITOT 10.0* 9.5*  PROT 6.6 6.4*  ALBUMIN  --  2.8*   No results for input(s): LIPASE, AMYLASE in the last 168 hours. No results for input(s): AMMONIA in the last 168 hours. Coagulation Profile: Recent Labs  Lab 04/09/19 1625  INR 1.2*   Cardiac Enzymes: No results for input(s): CKTOTAL, CKMB, CKMBINDEX, TROPONINI in the last 168 hours. BNP (last 3 results) No results for input(s): PROBNP in the last 8760 hours. HbA1C: No results for input(s): HGBA1C in the last 72 hours. CBG: No results for input(s): GLUCAP in the last 168 hours. Lipid Profile: No results for input(s): CHOL, HDL, LDLCALC, TRIG, CHOLHDL, LDLDIRECT in the last 72 hours. Thyroid Function Tests: No results for input(s): TSH, T4TOTAL, FREET4, T3FREE, THYROIDAB in the last 72 hours. Anemia Panel: No results for input(s): VITAMINB12, FOLATE, FERRITIN, TIBC, IRON, RETICCTPCT in the last 72 hours. Urine analysis:    Component Value Date/Time   COLORURINE YELLOW 03/22/2019 1226   APPEARANCEUR HAZY (A) 03/22/2019 1226   LABSPEC >1.046 (H) 03/22/2019 1226   PHURINE 5.0 03/22/2019 1226   GLUCOSEU NEGATIVE 03/22/2019 1226   HGBUR NEGATIVE 03/22/2019 1226   BILIRUBINUR NEGATIVE 03/22/2019 1226   KETONESUR NEGATIVE 03/22/2019 1226    PROTEINUR NEGATIVE 03/22/2019 1226   NITRITE NEGATIVE 03/22/2019 1226   LEUKOCYTESUR NEGATIVE 03/22/2019 1226   Sepsis Labs: @LABRCNTIP (procalcitonin:4,lacticidven:4) )No results found for this or any previous visit (from the past 240 hour(s)).   Radiological Exams on Admission: US Abdomen Limited Ruq  Result Date: 04/12/2019 CLINICAL DATA:  85 year old female with jaundice. EXAM: ULTRASOUND ABDOMEN LIMITED RIGHT UPPER QUADRANT COMPARISON:  CT abdomen pelvis dated 03/23/2019 FINDINGS: Gallbladder: There is sludge within the gallbladder. The gallbladder is distended. No gallbladder wall thickening or pericholecystic fluid. Negative sonographic Murphy's sign. Common bile duct: Diameter: The common bile duct is dilated measuring up to 14 mm. No shadowing stone noted in the visualized CBD. The central portion of the CBD at the head of the pancreas is not well visualized. Liver: The liver is grossly unremarkable as visualized. There is mild intrahepatic biliary ductal dilatation. Portal vein is patent on color Doppler imaging with normal direction of blood flow towards the liver. Other: There is a 13 mm right renal upper pole cyst. There is advanced atherosclerotic calcification of the aorta. IMPRESSION: 1. Distended gallbladder containing sludge. No sonographic findings of acute cholecystitis. 2. Dilated common bile duct. No stone identified in the visualized portion of the CBD. The central CBD at the head of the pancreas is suboptimally visualized. 3.  Aortic Atherosclerosis (ICD10-I70.0). Electronically Signed   By: Anner Crete M.D.   On: 04/12/2019 00:41    EKG: Independently reviewed. Atrial fibrillation, rate 102, diffuse repolarization abnormality.   Assessment/Plan   1. Painless jaundice  - Presents with 1 day of jaundice and is found to have AST 211, ALT 131, and total bilirubin 9.5 with CBD dilation on [place] but no etiology identified  - She was noted to have biliary dilation during  recent admission, total bilirubin was 2.0 at that time, and she was planned for outpatient endoscopic [place] as MRI precluded by her pacer (patient believes she has had MRI with the pacer before, will discus with MRI tech)  - On the recent CT abd/pelvis, no pancreatic head mass noted   - Continue bowel rest, gentle IVF hydration, trend LFT's, MRCP if pacer is compatible     2. Atrial fibrillation  - She is in permanent atrial fibrillation with rate 90-110's in ED - She was recently admitted to GI bleeding and has decided that she no longer wants to be anticoagulated; cardiology stopped warfarin and started ASA 325 mg  - Continue cardiac monitoring, given diltiazem IVP as needed for rate    3. CAD  -  No anginal complaints  - Hold statin in acute liver disease, hold beta-blocker initially in light of low-normal BP in ED    4. Hypothyroidism  - Continue Synthroid   5. Ischemic cardiomyopathy  - Appears hypovolemic   - EF was 45-50% in September 2019  - Caution with IVF, follow daily wts and I/O's    PPE: Mask, face shield  DVT prophylaxis: SCD's  Code Status: Full  Family Communication: Discussed with patient  Consults called: None  Admission status: observation     Vianne Bulls, MD [facility] Pager [phone]  If 7PM-7AM, please contact night-coverage [email or web address] Password [email or web address]  04/12/2019, 5:21 AM

## 2019-04-12 NOTE — Consult Note (Signed)
Referring Provider:   [facility]        Primary Care Physician:  Caryl Bis, MD Primary Gastroenterologist:  Dr. Gala Romney           Reason for Consultation:   jaundice                ASSESSMENT /  PLAN    69. 85 yo female with obstructive jaundice.  Progression of jaundice in last two weeks. No pancreatic mass on CT scan earlier this month. RUQ today >>> gallbladder sludge, CBD dilation to14 mm without visible stone. Rule out choledocholithiasis. If malignancy would not think jaundice would progress this quickly, especially in absence of mass on CT scan -Mild biliary duct dilation and mildly elevated liver tests were found during recent hospital admission at [facility]. MRCP not done due to pacemaker.  For further work-up patient is scheduled to have EUS with Dr. Ardis Hughs next month.  In the interim her liver tests have become markedly worse.  -Patient will probably need ERCP, possibly EUS as well tomorrow. She no longer takes Coumadin , INR is 1.2 -Afebrile, normal WBC. Would hold off on antibiotics for now.   2. Recent GI bleed in setting of supra therapeutic INR. Maybe diverticular hemorrhage. EGD >> H.pylori gastritis, non-bleeding duodenal AVMs ( ablated). Colonoscopy >> diverticulosis.  She received 2 units of packed red blood cells. Hgb up to 12.3 now. NO further bleeding.  -H.pylori infection, being treated with Amoxicillin and Biaxin. On PPI   3. Ongoing intermittent abdominal pain, nausea / vomiting since leaving [facility] several days ago. No pathology on EGD to explain pain. Could be biliary colic but she points mid lower abdomen, not upper. Lipase was normal at [facility] earlier this month.   4. Afib, no longer on coumadin. Initially tachycardic, HR in 90's no w  HPI:     Angelica Hernandez is a 85 y.o. female with pmh significant for CAD / CABG, CKD, ischemic cardiomyopathy, AFib on coumadin,  pacemaker, who was hospitalized at [facility] earlier this month for after a  fall. She complained of upper abdominal pain.  Alkaline phosphatase, total bilirubin and enzymes were mildly elevated.  Her white count was elevated at 14.  Abdominal CT showed mild intra and extrahepatic biliary duct dilation, no definite pancreatic mass.  Her INR was greater than 10.  Hemoglobin had declined from 13 into the 8 range and she reported blood in her stools.  She was FOBT positive in the ED.  Liver enzymes and bilirubin noted to be mildly elevated. She was Afib with RVR in the ED. once stabilized she underwent EGD and colonoscopy by Dr. Donzetta Kohut.  AVMs were ablated.  Abnormal mucosa in the stomach was biopsy and path compatible chronic active gastritis with H. pylori.  There was focal intestinal metaplasia no dysplasia.  Colonoscopy showed only diverticulosis.  He was transfused 2 units of packed red blood cells.  Hemoglobin 10.7 by discharge   Patient says she continued to have intermittent abdominal pain, nausea and vomiting following hospital discharge on 03/27/2019.  She describes abdominal pain as being more diffuse as opposed to the upper abdominal pain described in [facility] ED earlier this month.  The pain is remittent.  She does not relate the pain to food especially since she really has not been eating much.  Her appetite is poor . She has been feeling very weak, she saw PCP, found to have markedly abnormal liver tests and sent to  ED.    Data Review:  WBC 9.3, hemoglobin 12.3 INR 1.2 Total bilirubin 10, direct bilirubin 6.1, alkaline phosphatase 469, AST 211, ALT 131 SARS pending  RUQ ultrasound 04/12/19-   distended gallbladder containing sludge no evidence for acute cholecystitis.  Common bile duct dilated to 14 mm.  No stone in seen in the distal portion of the CBD   Endoscopic History:   03/26/19 EGD and colonoscopy -Dr. Gala Romney for anemia, limited rectal bleeding with BM, anemia.   -Mild Schatzki ring-not manipulated - Small hiatal hernia. - Abnormal gastric mucolas of  uncertain significance-biopsied. - Duodenal AVMs ablated. - Gastric biopsies were taken with a cold forceps for histology.  STOMACH, BIOPSY: - Chronic active gastritis with Helicobacter pylori. - Intestinal metaplasia is present focally, negative for dysplasia.   Colonoscopy -complete exam with adequate prep Diverticulosis in the sigmoid colon and in the descending colon. I suspect a recent diverticular bleed in the setting of hypercoagulability - The examination was otherwise normal on direct and retroflexion views.   Past Medical History:  Diagnosis Date  . Anxiety   . CAD (coronary artery disease)   . CKD (chronic kidney disease)   . Gastro-esophageal reflux disease without esophagitis   . Hypothyroidism   . Ischemic cardiomyopathy   . Mild dilation of ascending aorta (HCC)   . Moderate protein-calorie malnutrition ([place])   . Permanent atrial fibrillation ([place])   . Severe episode of recurrent major depressive disorder, without psychotic features ([place])   . Symptomatic bradycardia     Past Surgical History:  Procedure Laterality Date  . CESAREAN SECTION    . COLONOSCOPY N/A 03/26/2019   Procedure: COLONOSCOPY;  Surgeon: Daneil Dolin, MD;  Location: [facility];  Service: Endoscopy;  Laterality: N/A;  . CORONARY ARTERY BYPASS GRAFT    . ESOPHAGOGASTRODUODENOSCOPY (EGD) WITH PROPOFOL N/A 03/26/2019   Procedure: ESOPHAGOGASTRODUODENOSCOPY (EGD) WITH PROPOFOL;  Surgeon: Daneil Dolin, MD;  Location: [facility];  Service: Endoscopy;  Laterality: N/A;  . OTHER SURGICAL HISTORY     cesearan section   . PACEMAKER GENERATOR CHANGE  06/15/2017   [employer] Accolade SR L 300 ([place] [id number]) implanted by Dr Ernestina Patches at [facility] in [place] for symptomatic bradycardia  . PACEMAKER IMPLANT  11/16/2006   PPM implanted for bradycardia    Prior to Admission medications   Medication Sig Start Date End Date Taking? Authorizing Provider   amoxicillin (AMOXIL) 500 MG capsule Take 500 mg by mouth 2 (two) times daily. For 14 days   Yes [provider]  aspirin EC 325 MG tablet Take 1 tablet (325 mg total) by mouth daily. 03/30/19  Yes Herminio Commons, MD  atorvastatin (LIPITOR) 10 MG tablet Take 10 mg by mouth daily.   Yes [provider]  benazepril (LOTENSIN) 5 MG tablet Take 2.5 mg by mouth daily.   Yes [provider]  Cholecalciferol (VITAMIN D3) 1000 units CAPS Take 1,000 Units by mouth daily.   Yes [provider]  clarithromycin (BIAXIN) 500 MG tablet Take 500 mg by mouth 2 (two) times daily. For 14 days   Yes [provider]  diltiazem (CARTIA XT) 240 MG 24 hr capsule Take 240 mg by mouth daily.   Yes [provider]  DULoxetine (CYMBALTA) 30 MG capsule Take 1 capsule by mouth daily. 03/01/19  Yes [provider]  FERREX 150 150 MG capsule Take 150 mg by mouth daily.  02/07/18  Yes [provider]  gabapentin (NEURONTIN) 300 MG capsule Take 300 mg by mouth at bedtime. For pain/sleep 01/18/19  Yes [provider]  lansoprazole (PREVACID) 30 MG capsule Take 30 mg by mouth 2 (two) times daily. 14 day course.   Yes [provider]  levothyroxine (SYNTHROID, LEVOTHROID) 50 MCG tablet Take 50 mcg by mouth daily before breakfast.   Yes [provider]  metoprolol succinate (TOPROL-XL) 50 MG 24 hr tablet Take 50 mg by mouth daily. Take with or immediately following a meal.   Yes [provider]  metoprolol tartrate (LOPRESSOR) 100 MG tablet Take 1 tablet (100 mg total) by mouth 2 (two) times daily. 03/27/19 04/26/19 Yes Shah, Pratik D, DO  nitroGLYCERIN (NITROSTAT) 0.4 MG SL tablet Place 1 tablet (0.4 mg total) under the tongue every 5 (five) minutes as needed for chest pain. 09/18/18  Yes Herminio Commons, MD  pantoprazole (PROTONIX) 40 MG tablet Take 1 tablet (40 mg total) by mouth daily. 03/27/19 03/26/20 Yes Shah, Pratik D,  DO  vitamin C (ASCORBIC ACID) 500 MG tablet Take 500 mg by mouth 2 (two) times daily.    Yes [provider]    Current Facility-Administered Medications  Medication Dose Route Frequency Provider Last Rate Last Dose  . 0.9 %  sodium chloride infusion  250 mL Intravenous PRN Opyd, Ilene Qua, MD      . 0.9 %  sodium chloride infusion   Intravenous Continuous Opyd, Ilene Qua, MD 70 mL/hr at 04/12/19 0755    . diltiazem (CARDIZEM) injection 15 mg  15 mg Intravenous Q4H PRN Opyd, Ilene Qua, MD   15 mg at 04/12/19 1329  . levothyroxine (SYNTHROID) tablet 50 mcg  50 mcg Oral QAC breakfast Opyd, Ilene Qua, MD   50 mcg at 04/12/19 0844  . ondansetron (ZOFRAN) tablet 4 mg  4 mg Oral Q6H PRN Opyd, Ilene Qua, MD       Or  . ondansetron (ZOFRAN) injection 4 mg  4 mg Intravenous Q6H PRN Opyd, Ilene Qua, MD      . pantoprazole (PROTONIX) EC tablet 40 mg  40 mg Oral Daily Opyd, Ilene Qua, MD   40 mg at 04/12/19 1012  . sodium chloride flush (NS) 0.9 % injection 3 mL  3 mL Intravenous Q12H Opyd, Ilene Qua, MD   3 mL at 04/12/19 1012  . sodium chloride flush (NS) 0.9 % injection 3 mL  3 mL Intravenous Q12H Opyd, Ilene Qua, MD   3 mL at 04/12/19 1012  . sodium chloride flush (NS) 0.9 % injection 3 mL  3 mL Intravenous PRN Opyd, Ilene Qua, MD       Current Outpatient Medications  Medication Sig Dispense Refill  . amoxicillin (AMOXIL) 500 MG capsule Take 500 mg by mouth 2 (two) times daily. For 14 days    . aspirin EC 325 MG tablet Take 1 tablet (325 mg total) by mouth daily. 30 tablet 0  . atorvastatin (LIPITOR) 10 MG tablet Take 10 mg by mouth daily.    . benazepril (LOTENSIN) 5 MG tablet Take 2.5 mg by mouth daily.    . Cholecalciferol (VITAMIN D3) 1000 units CAPS Take 1,000 Units by mouth daily.    . clarithromycin (BIAXIN) 500 MG tablet Take 500 mg by mouth 2 (two) times daily. For 14 days    . diltiazem (CARTIA XT) 240 MG 24 hr capsule Take 240 mg by mouth daily.    . DULoxetine (CYMBALTA) 30 MG  capsule Take 1 capsule by  mouth daily.    Marland Kitchen FERREX 150 150 MG capsule Take 150 mg by mouth daily.   1  . gabapentin (NEURONTIN) 300 MG capsule Take 300 mg by mouth at bedtime. For pain/sleep    . lansoprazole (PREVACID) 30 MG capsule Take 30 mg by mouth 2 (two) times daily. 14 day course.    . levothyroxine (SYNTHROID, LEVOTHROID) 50 MCG tablet Take 50 mcg by mouth daily before breakfast.    . metoprolol succinate (TOPROL-XL) 50 MG 24 hr tablet Take 50 mg by mouth daily. Take with or immediately following a meal.    . metoprolol tartrate (LOPRESSOR) 100 MG tablet Take 1 tablet (100 mg total) by mouth 2 (two) times daily. 60 tablet 3  . nitroGLYCERIN (NITROSTAT) 0.4 MG SL tablet Place 1 tablet (0.4 mg total) under the tongue every 5 (five) minutes as needed for chest pain. 25 tablet 3  . pantoprazole (PROTONIX) 40 MG tablet Take 1 tablet (40 mg total) by mouth daily. 30 tablet 1  . vitamin C (ASCORBIC ACID) 500 MG tablet Take 500 mg by mouth 2 (two) times daily.       Allergies as of 04/11/2019  . (No Known Allergies)    Family History  Problem Relation Age of Onset  . Heart disease Mother   . Hypertension Mother   . Diabetes Mother   . Hypertension Father   . Heart disease Father   . Hypertension Brother     Social History   Socioeconomic History  . Marital status: Widowed    Spouse name: Not on file  . Number of children: Not on file  . Years of education: Not on file  . Highest education level: Not on file  Occupational History  . Not on file  Social Needs  . Financial resource strain: Not on file  . Food insecurity    Worry: Not on file    Inability: Not on file  . Transportation needs    Medical: Not on file    Non-medical: Not on file  Tobacco Use  . Smoking status: Current Every Day Smoker    Packs/day: 0.50    Types: Cigarettes  . Smokeless tobacco: Never Used  Substance and Sexual Activity  . Alcohol use: Never    Frequency: Never  . Drug use: Never  .  Sexual activity: Not on file  Lifestyle  . Physical activity    Days per week: Not on file    Minutes per session: Not on file  . Stress: Not on file  Relationships  . Social Herbalist on phone: Not on file    Gets together: Not on file    Attends religious service: Not on file    Active member of club or organization: Not on file    Attends meetings of clubs or organizations: Not on file    Relationship status: Not on file  . Intimate partner violence    Fear of current or ex partner: Not on file    Emotionally abused: Not on file    Physically abused: Not on file    Forced sexual activity: Not on file  Other Topics Concern  . Not on file  Social History Narrative   Lives with daughter in [place]: All systems reviewed and negative except where noted in HPI.  Physical Exam: Vital signs in last 24 hours: Temp:  [98 F (36.7 C)-98.5 F (36.9 C)] 98.5 F (36.9 C) (10/29  1115) Pulse Rate:  [47-143] 94 (10/29 1345) Resp:  [9-34] 25 (10/29 1345) BP: (101-178)/(61-141) 130/63 (10/29 1345) SpO2:  [70 %-100 %] 99 % (10/29 1345)   General:   Alert, well-developed,  female in NAD Psych:  Pleasant, cooperative. Normal mood and affect. Eyes:  Pupils equal, sclera clear, no icterus.   Conjunctiva pink. Ears:  Normal auditory acuity. Nose:  No deformity, discharge,  or lesions. Neck:  Supple; no masses Lungs:  Clear throughout to auscultation.   No wheezes, crackles, or rhonchi.  Heart:  Tachycardia, irreg rhythm. No lower extremity edema Abdomen:  Soft, non-distended, nontender, BS active, no palp mass   Rectal:  Deferred  Msk:  Symmetrical without gross deformities. . Neurologic:  Alert and  oriented x4;  grossly normal neurologically. Skin:  Intact without significant lesions or rashes.   Intake/Output from previous day: No intake/output data recorded. Intake/Output this shift: Total I/O In: 100 [IV Piggyback:100] Out: -   Lab Results:  Recent Labs    04/09/19 1625 04/11/19 1538  WBC 9.4 9.3  HGB 13.1 12.3  HCT 39.8 37.9  PLT 454* 460*   BMET Recent Labs    04/11/19 1538  NA 138  K 3.3*  CL 98  CO2 26  GLUCOSE 134*  BUN 10  CREATININE 0.80  CALCIUM 8.9   LFT Recent Labs    04/11/19 1538 04/12/19 0513  PROT 6.4*  --   ALBUMIN 2.8*  --   AST 211*  --   ALT 131*  --   ALKPHOS 469*  --   BILITOT 9.5* 8.9*  BILIDIR  --  5.5*  IBILI  --  3.4*   PT/INR Recent Labs    04/09/19 1625 04/12/19 0800  LABPROT 12.4* 14.6  INR 1.2* 1.2   Hepatitis Panel Recent Labs    04/12/19 0513  HEPBSAG NON REACTIVE  HCVAB NON REACTIVE  HEPAIGM NON REACTIVE  HEPBIGM NON REACTIVE     . CBC Latest Ref Rng & Units 04/11/2019 04/09/2019 03/27/2019  WBC 4.0 - 10.5 K/uL 9.3 9.4 7.2  Hemoglobin 12.0 - 15.0 g/dL 12.3 13.1 10.7(L)  Hematocrit 36.0 - 46.0 % 37.9 39.8 32.9(L)  Platelets 150 - 400 K/uL 460(H) 454(H) 248    . CMP Latest Ref Rng & Units 04/12/2019 04/11/2019 04/09/2019  Glucose 70 - 99 mg/dL - 134(H) -  BUN 8 - 23 mg/dL - 10 -  Creatinine 0.44 - 1.00 mg/dL - 0.80 -  Sodium 135 - 145 mmol/L - 138 -  Potassium 3.5 - 5.1 mmol/L - 3.3(L) -  Chloride 98 - 111 mmol/L - 98 -  CO2 22 - 32 mmol/L - 26 -  Calcium 8.9 - 10.3 mg/dL - 8.9 -  Total Protein 6.5 - 8.1 g/dL - 6.4(L) 6.6  Total Bilirubin 0.3 - 1.2 mg/dL 8.9(H) 9.5(H) 10.0(H)  Alkaline Phos 38 - 126 U/L - 469(H) -  AST 15 - 41 U/L - 211(H) 202(H)  ALT 0 - 44 U/L - 131(H) 133(H)   Studies/Results: US Abdomen Limited Ruq  Result Date: 04/12/2019 CLINICAL DATA:  85 year old female with jaundice. EXAM: ULTRASOUND ABDOMEN LIMITED RIGHT UPPER QUADRANT COMPARISON:  CT abdomen pelvis dated 03/23/2019 FINDINGS: Gallbladder: There is sludge within the gallbladder. The gallbladder is distended. No gallbladder wall thickening or pericholecystic fluid. Negative sonographic Murphy's sign. Common bile duct: Diameter: The common bile duct is dilated  measuring up to 14 mm. No shadowing stone noted in the visualized CBD. The central portion of the CBD  at the head of the pancreas is not well visualized. Liver: The liver is grossly unremarkable as visualized. There is mild intrahepatic biliary ductal dilatation. Portal vein is patent on color Doppler imaging with normal direction of blood flow towards the liver. Other: There is a 13 mm right renal upper pole cyst. There is advanced atherosclerotic calcification of the aorta. IMPRESSION: 1. Distended gallbladder containing sludge. No sonographic findings of acute cholecystitis. 2. Dilated common bile duct. No stone identified in the visualized portion of the CBD. The central CBD at the head of the pancreas is suboptimally visualized. 3.  Aortic Atherosclerosis (ICD10-I70.0). Electronically Signed   By: Anner Crete M.D.   On: 04/12/2019 00:41    Principal Problem:   Painless jaundice Active Problems:   Atrial fibrillation (HCC)   CAD (coronary artery disease)   Hypothyroidism    Tye Savoy, NP-C @  04/12/2019, 1:59 PM

## 2019-04-12 NOTE — ED Notes (Signed)
Pt returned from US

## 2019-04-13 DIAGNOSIS — R945 Abnormal results of liver function studies: Secondary | ICD-10-CM

## 2019-04-13 DIAGNOSIS — K838 Other specified diseases of biliary tract: Secondary | ICD-10-CM | POA: Diagnosis not present

## 2019-04-13 DIAGNOSIS — I420 Dilated cardiomyopathy: Secondary | ICD-10-CM | POA: Diagnosis not present

## 2019-04-13 DIAGNOSIS — E876 Hypokalemia: Secondary | ICD-10-CM

## 2019-04-13 DIAGNOSIS — I2510 Atherosclerotic heart disease of native coronary artery without angina pectoris: Secondary | ICD-10-CM

## 2019-04-13 DIAGNOSIS — I4891 Unspecified atrial fibrillation: Secondary | ICD-10-CM | POA: Diagnosis not present

## 2019-04-13 LAB — PROTIME-INR
INR: 1.2 (ref 0.8–1.2)
Prothrombin Time: 15.4 seconds — ABNORMAL HIGH (ref 11.4–15.2)

## 2019-04-13 LAB — BASIC METABOLIC PANEL
Anion gap: 13 (ref 5–15)
BUN: 7 mg/dL — ABNORMAL LOW (ref 8–23)
CO2: 23 mmol/L (ref 22–32)
Calcium: 8.7 mg/dL — ABNORMAL LOW (ref 8.9–10.3)
Chloride: 100 mmol/L (ref 98–111)
Creatinine, Ser: 0.66 mg/dL (ref 0.44–1.00)
GFR calc Af Amer: >60 mL/min (ref >60)
GFR calc non Af Amer: >60 mL/min (ref >60)
Glucose, Bld: 147 mg/dL — ABNORMAL HIGH (ref 70–99)
Potassium: 3.5 mmol/L (ref 3.5–5.1)
Sodium: 136 mmol/L (ref 135–145)

## 2019-04-13 LAB — CBC WITH DIFFERENTIAL/PLATELET
Abs Immature Granulocytes: 0.03 10*3/uL (ref 0.00–0.07)
Basophils Absolute: 0.1 10*3/uL (ref 0.0–0.1)
Basophils Relative: 1 %
Eosinophils Absolute: 0.4 10*3/uL (ref 0.0–0.5)
Eosinophils Relative: 5 %
HCT: 31.2 % — ABNORMAL LOW (ref 36.0–46.0)
Hemoglobin: 10.6 g/dL — ABNORMAL LOW (ref 12.0–15.0)
Immature Granulocytes: 0 %
Lymphocytes Relative: 19 %
Lymphs Abs: 1.5 10*3/uL (ref 0.7–4.0)
MCH: 29.4 pg (ref 26.0–34.0)
MCHC: 34 g/dL (ref 30.0–36.0)
MCV: 86.4 fL (ref 80.0–100.0)
Monocytes Absolute: 0.9 10*3/uL (ref 0.1–1.0)
Monocytes Relative: 12 %
Neutro Abs: 4.9 10*3/uL (ref 1.7–7.7)
Neutrophils Relative %: 63 %
Platelets: 367 10*3/uL (ref 150–400)
RBC: 3.61 MIL/uL — ABNORMAL LOW (ref 3.87–5.11)
RDW: 14.9 % (ref 11.5–15.5)
WBC: 7.7 10*3/uL (ref 4.0–10.5)
nRBC: 0 % (ref 0.0–0.2)

## 2019-04-13 LAB — GLUCOSE, CAPILLARY: Glucose-Capillary: 102 mg/dL — ABNORMAL HIGH (ref 70–99)

## 2019-04-13 MED ORDER — DILTIAZEM HCL ER COATED BEADS 240 MG PO CP24
240.0000 mg | ORAL_CAPSULE | Freq: Every day | ORAL | Status: DC
  Administered 2019-04-13: 240 mg via ORAL
  Filled 2019-04-13: qty 2

## 2019-04-13 MED ORDER — METOPROLOL SUCCINATE ER 50 MG PO TB24: 50.0000 mg | ORAL_TABLET | Freq: Every day | ORAL | Status: DC

## 2019-04-13 MED ORDER — METOPROLOL TARTRATE 25 MG PO TABS
25.0000 mg | ORAL_TABLET | Freq: Four times a day (QID) | ORAL | Status: DC
  Administered 2019-04-13 – 2019-04-14 (×3): 25 mg via ORAL
  Filled 2019-04-13 (×3): qty 1

## 2019-04-13 MED ORDER — POTASSIUM CHLORIDE CRYS ER 20 MEQ PO TBCR
40.0000 meq | EXTENDED_RELEASE_TABLET | Freq: Once | ORAL | Status: AC
  Administered 2019-04-13: 40 meq via ORAL
  Filled 2019-04-13: qty 2

## 2019-04-13 MED ORDER — METOPROLOL TARTRATE 100 MG PO TABS: 100.0000 mg | ORAL_TABLET | Freq: Two times a day (BID) | ORAL | Status: DC

## 2019-04-13 MED ORDER — POTASSIUM CHLORIDE 10 MEQ/100ML IV SOLN
10.0000 meq | INTRAVENOUS | Status: AC
  Administered 2019-04-13 (×4): 10 meq via INTRAVENOUS
  Filled 2019-04-13 (×4): qty 100

## 2019-04-13 NOTE — Progress Notes (Signed)
Progress Note    ASSESSMENT AND PLAN:    85 yo female with obstructive jaundice.  Progression of jaundice over last two weeks. No pancreatic mass on CT scan earlier this month. RUQ today >>> gallbladder sludge, CBD dilation to14 mm without visible stone. Rule out choledocholithiasis. If malignancy would not think jaundice would progress this quickly, especially in absence of mass on CT scan.  -Some improvement in liver tests overnight.  -ERCP / EUS this am had to be postponed - in Afib with RVR. Hypokalemic. Potassium repletion in progress. Will give diet, make NPO after MN and plan for procedures to be done tomorrow.       SUBJECTIVE   Sleeping. Didn't realized she wasn't getting procedures done today. Asked about going home.   OBJECTIVE:     Vital signs in last 24 hours: Temp:  [97.7 F (36.5 C)-98.7 F (37.1 C)] 98.3 F (36.8 C) (10/30 0801) Pulse Rate:  [76-123] 117 (10/30 0801) Resp:  [15-26] 18 (10/30 0801) BP: (106-149)/(55-130) 106/77 (10/30 0801) SpO2:  [95 %-100 %] 99 % (10/30 0801) Weight:  [49.8 kg] 49.8 kg (10/29 2104) Last BM Date: 04/11/19 General:   Jaundiced, in NAD Heart:  Irreg rate and rhythm;  No lower extremity edema   Pulm: Normal respiratory effort. Abdomen:  Soft, nondistended, nontender.  Normal bowel sounds.          Psych:   cooperative.    Intake/Output from previous day: 10/29 0701 - 10/30 0700 In: 100 [IV Piggyback:100] Out: 300 [Urine:300] Intake/Output this shift: Total I/O In: -  Out: 350 [Urine:350]  Lab Results: Recent Labs    04/11/19 1538 04/13/19 0349  WBC 9.3 7.7  HGB 12.3 10.6*  HCT 37.9 31.2*  PLT 460* 367   BMET Recent Labs    04/11/19 1538 04/13/19 0349  NA 138 138  K 3.3* 2.4*  CL 98 100  CO2 26 26  GLUCOSE 134* 120*  BUN 10 <5*  CREATININE 0.80 0.58  CALCIUM 8.9 8.4*   LFT Recent Labs    04/12/19 0513 04/13/19 0349  PROT  --  5.7*  ALBUMIN  --  2.4*  AST  --  148*  ALT  --  102*  ALKPHOS   --  379*  BILITOT 8.9* 8.2*  BILIDIR 5.5*  --   IBILI 3.4*  --    PT/INR Recent Labs    04/12/19 0800 04/13/19 0349  LABPROT 14.6 15.4*  INR 1.2 1.2   Hepatitis Panel Recent Labs    04/12/19 0513  HEPBSAG NON REACTIVE  HCVAB NON REACTIVE  HEPAIGM NON REACTIVE  HEPBIGM NON REACTIVE    US Abdomen Limited Ruq  Result Date: 04/12/2019 CLINICAL DATA:  85 year old female with jaundice. EXAM: ULTRASOUND ABDOMEN LIMITED RIGHT UPPER QUADRANT COMPARISON:  CT abdomen pelvis dated 03/23/2019 FINDINGS: Gallbladder: There is sludge within the gallbladder. The gallbladder is distended. No gallbladder wall thickening or pericholecystic fluid. Negative sonographic Murphy's sign. Common bile duct: Diameter: The common bile duct is dilated measuring up to 14 mm. No shadowing stone noted in the visualized CBD. The central portion of the CBD at the head of the pancreas is not well visualized. Liver: The liver is grossly unremarkable as visualized. There is mild intrahepatic biliary ductal dilatation. Portal vein is patent on color Doppler imaging with normal direction of blood flow towards the liver. Other: There is a 13 mm right renal upper pole cyst. There is advanced atherosclerotic calcification of the aorta. IMPRESSION:  1. Distended gallbladder containing sludge. No sonographic findings of acute cholecystitis. 2. Dilated common bile duct. No stone identified in the visualized portion of the CBD. The central CBD at the head of the pancreas is suboptimally visualized. 3.  Aortic Atherosclerosis (ICD10-I70.0). Electronically Signed   By: Anner Crete M.D.   On: 04/12/2019 00:41     Principal Problem:   Painless jaundice Active Problems:   Atrial fibrillation (HCC)   CAD (coronary artery disease)   Hypothyroidism     LOS: 1 day   Tye Savoy ,NP 04/13/2019, 12:35 PM

## 2019-04-13 NOTE — Progress Notes (Signed)
CRITICAL VALUE ALERT  Critical Value:  K+ 2.4   Date & Time Notied: 04/13/2019 @ 604  Provider Notified: Linda Hedges, MD   Orders Received/Actions taken: IV potassium

## 2019-04-13 NOTE — Plan of Care (Signed)
  Problem: Activity: Goal: Risk for activity intolerance will decrease Outcome: Progressing   

## 2019-04-13 NOTE — Progress Notes (Signed)
Patient's heart rate went up to the 180s while using bedside commode, but non-sustaining. Patient's HR when in bed fluctuates in the 110-140s. On call Norins, MD notified. Order given to administer PRN cardizem. Will continue to monitor.  Einar Nolasco Nghus, RN

## 2019-04-13 NOTE — Consult Note (Addendum)
Cardiology Consultation:   Patient ID: Angelica Hernandez MRN: [id number]; DOB: [birth year]  Admit date: 04/11/2019 Date of Consult: 04/13/2019  Primary Care Provider: Caryl Bis, MD Primary Cardiologist: Angelica Sable, MD  Primary Electrophysiologist:  Angelica Grayer, MD    Patient Profile:   Angelica Hernandez is a 85 y.o. female with a hx of CAD s/p CABG 1980's, sick sinus syndrome s/p PPM, permanent atrial fibrillation, mild dilation of ascending aorta who is being seen today for the evaluation of atrial fibrillation with RVR at the request of Dr. Benny Lennert.  History of Present Illness:   [name] has a past medical history as above.  She has a Engineer, production pacemaker that was initially placed in 2008 with generator replacement in 2019.  Only hospitalized on 03/22/2019 with acute symptomatic blood loss anemia in the setting of supratherapeutic INR.  She had elevated heart rate requiring IV Cardizem.  Hemoglobin was 7 and she required transfusion.  Could be revealed diverticular bleeding.  She was stabilized and discharged on her normal metoprolol tartrate 100 mg twice daily and warfarin was resumed.  She was subsequently seen in the office by Dr. Bronson Ing on 03/30/2019 at which time the patient preferred to come off of warfarin and she was started on aspirin 325 mg daily.  Ms. Loeffelholz presented to the emergency department on 04/12/2019 for evaluation of jaundice having been referred by her PCP due to elevated LFT labs.  Is noted to have had a poor appetite recently.  She was found to be in A. fib with RVR in the ED, possibly influenced by dehydration.  IV fluids were initiated with improvement in her heart rate.  She was also given several doses of IV Cardizem 15 mg.  Her metoprolol has been on hold and she is now on Cardizem CD 240 mg daily.  On my assessment the patient is jaundiced.  She is resting comfortably in bed without any complaints.  She has no symptoms of rapid  A. fib.  She denies any chest discomfort, shortness of breath, orthopnea, lightheadedness, edema.   Her daughter is present and says that after the last hospitalization the patient was discharged on metoprolol tartrate 100 mg twice daily.  This apparently was changed from Toprol-XL 50 mg daily.  The patient did not understand the switch and she has been taking both.   Her daughter also says that the patient has had a very poor appetite.  The patient denies nausea or early satiety.  She says that food just does not look or smell appetizing.  Heart Pathway Score:     Past Medical History:  Diagnosis Date   Anxiety    CAD (coronary artery disease)    CKD (chronic kidney disease)    Gastro-esophageal reflux disease without esophagitis    Hypothyroidism    Ischemic cardiomyopathy    Jaundice 03/2019   Mild dilation of ascending aorta (HCC)    Moderate protein-calorie malnutrition (HCC)    Permanent atrial fibrillation (HCC)    Severe episode of recurrent major depressive disorder, without psychotic features ([place])    Symptomatic bradycardia     Past Surgical History:  Procedure Laterality Date   CESAREAN SECTION     COLONOSCOPY N/A 03/26/2019   Procedure: COLONOSCOPY;  Surgeon: Angelica Dolin, MD;  Location: [facility];  Service: Endoscopy;  Laterality: N/A;   CORONARY ARTERY BYPASS GRAFT     ESOPHAGOGASTRODUODENOSCOPY (EGD) WITH PROPOFOL N/A 03/26/2019   Procedure: ESOPHAGOGASTRODUODENOSCOPY (EGD) WITH PROPOFOL;  Surgeon: Angelica Hernandez,  Angelica Estimable, MD;  Location: [facility];  Service: Endoscopy;  Laterality: N/A;   OTHER SURGICAL HISTORY     cesearan section    PACEMAKER GENERATOR CHANGE  06/15/2017   [facility] Scientific Accolade SR L 300 ([place] [id number]) implanted by Dr Angelica Hernandez at [facility] in [place] for symptomatic bradycardia   PACEMAKER IMPLANT  11/16/2006   PPM implanted for bradycardia     Home Medications:  Prior to Admission  medications   Medication Sig Start Date End Date Taking? Authorizing Provider  amoxicillin (AMOXIL) 500 MG capsule Take 500 mg by mouth 2 (two) times daily. For 14 days   Yes [provider]  aspirin EC 325 MG tablet Take 1 tablet (325 mg total) by mouth daily. 03/30/19  Yes Angelica Commons, MD  atorvastatin (LIPITOR) 10 MG tablet Take 10 mg by mouth daily.   Yes [provider]  benazepril (LOTENSIN) 5 MG tablet Take 2.5 mg by mouth daily.   Yes [provider]  Cholecalciferol (VITAMIN D3) 1000 units CAPS Take 1,000 Units by mouth daily.   Yes [provider]  clarithromycin (BIAXIN) 500 MG tablet Take 500 mg by mouth 2 (two) times daily. For 14 days   Yes [provider]  diltiazem (CARTIA XT) 240 MG 24 hr capsule Take 240 mg by mouth daily.   Yes [provider]  DULoxetine (CYMBALTA) 30 MG capsule Take 1 capsule by mouth daily. 03/01/19  Yes [provider]  FERREX 150 150 MG capsule Take 150 mg by mouth daily.  02/07/18  Yes [provider]  gabapentin (NEURONTIN) 300 MG capsule Take 300 mg by mouth at bedtime. For pain/sleep 01/18/19  Yes [provider]  lansoprazole (PREVACID) 30 MG capsule Take 30 mg by mouth 2 (two) times daily. 14 day course.   Yes [provider]  levothyroxine (SYNTHROID, LEVOTHROID) 50 MCG tablet Take 50 mcg by mouth daily before breakfast.   Yes [provider]  metoprolol succinate (TOPROL-XL) 50 MG 24 hr tablet Take 50 mg by mouth daily. Take with or immediately following a meal.   Yes [provider]  metoprolol tartrate (LOPRESSOR) 100 MG tablet Take 1 tablet (100 mg total) by mouth 2 (two) times daily. 03/27/19 04/26/19 Yes Shah, Pratik D, DO  nitroGLYCERIN (NITROSTAT) 0.4 MG SL tablet Place 1 tablet (0.4 mg total) under the tongue every 5 (five) minutes as needed for chest pain. 09/18/18  Yes Angelica Commons, MD  pantoprazole (PROTONIX) 40 MG tablet  Take 1 tablet (40 mg total) by mouth daily. 03/27/19 03/26/20 Yes Shah, Pratik D, DO  vitamin C (ASCORBIC ACID) 500 MG tablet Take 500 mg by mouth 2 (two) times daily.    Yes [provider]    Inpatient Medications: Scheduled Meds:  diltiazem  240 mg Oral Daily   levothyroxine  50 mcg Oral QAC breakfast   pantoprazole  40 mg Oral Daily   sodium chloride flush  3 mL Intravenous Q12H   sodium chloride flush  3 mL Intravenous Q12H   Continuous Infusions:  sodium chloride     PRN Meds: sodium chloride, ondansetron **OR** ondansetron (ZOFRAN) IV, sodium chloride flush  Allergies:   No Known Allergies  Social History:   Social History   Socioeconomic History   Marital status: Widowed    Spouse name: Not on file   Number of children: Not on file   Years of education: Not on file   Highest education  level: Not on file  Occupational History   Not on file  Social Needs   Financial resource strain: Not on file   Food insecurity    Worry: Not on file    Inability: Not on file   Transportation needs    Medical: Not on file    Non-medical: Not on file  Tobacco Use   Smoking status: Current Every Day Smoker    Packs/day: 0.50    Types: Cigarettes   Smokeless tobacco: Never Used  Substance and Sexual Activity   Alcohol use: Never    Frequency: Never   Drug use: Never   Sexual activity: Not on file  Lifestyle   Physical activity    Days per week: Not on file    Minutes per session: Not on file   Stress: Not on file  Relationships   Social connections    Talks on phone: Not on file    Gets together: Not on file    Attends religious service: Not on file    Active member of club or organization: Not on file    Attends meetings of clubs or organizations: Not on file    Relationship status: Not on file   Intimate partner violence    Fear of current or ex partner: Not on file    Emotionally abused: Not on file    Physically abused: Not on  file    Forced sexual activity: Not on file  Other Topics Concern   Not on file  Social History Narrative   Lives with daughter in [place]    Family History:    Family History  Problem Relation Age of Onset   Heart disease Mother    Hypertension Mother    Diabetes Mother    Hypertension Father    Heart disease Father    Hypertension Brother      ROS:  Please see the history of present illness.   All other ROS reviewed and negative.     Physical Exam/Data:   Vitals:   04/12/19 2104 04/13/19 0517 04/13/19 0801 04/13/19 1436  BP: (!) 142/90 138/88 106/77 107/69  Pulse: (!) 120 (!) 123 (!) 117 95  Resp: 18 18 18 15   Temp: 98.6 F (37 C) 98.7 F (37.1 C) 98.3 F (36.8 C) 98 F (36.7 C)  TempSrc: Oral Oral Oral Oral  SpO2: 99% 99% 99% 99%  Weight: 49.8 kg     Height:        Intake/Output Summary (Last 24 hours) at 04/13/2019 1454 Last data filed at 04/13/2019 1021 Gross per 24 hour  Intake 0 ml  Output 650 ml  Net -650 ml   Last 3 Weights 04/12/2019 04/12/2019 03/30/2019  Weight (lbs) 109 lb 12.6 oz 109 lb 12.6 oz 118 lb  Weight (kg) 49.8 kg 49.8 kg 53.524 kg     Body mass index is 17.72 kg/m.  General: Thin, elderly female, in no acute distress HEENT: normal Lymph: no adenopathy Neck: no JVD Endocrine:  No thryomegaly Vascular: No carotid bruits; FA pulses 2+ bilaterally without bruits  Cardiac:  normal S1, S2; irregularly irregular rhythm; no murmur  Lungs:  clear to auscultation bilaterally, no wheezing, rhonchi or rales  Abd: soft, nontender, no hepatomegaly  Ext: no edema Musculoskeletal:  No deformities, BUE and BLE strength normal and equal Skin: warm and dry, skin has a yellow cast Neuro:  CNs 2-12 intact, no focal abnormalities noted Psych:  Normal affect   EKG:  The EKG was  personally reviewed and demonstrates: Atrial fibrillation, 118 bpm, mild nonspecific ST changes Telemetry:  Telemetry was personally reviewed and demonstrates: Atrial  fibrillation in the 100s  Relevant CV Studies:  Echocardiogram 02/22/2018 Study Conclusions  - Left ventricle: The cavity size was normal. Wall thickness was   normal. Systolic function was mildly reduced. The estimated   ejection fraction was in the range of 45% to 50%. Diffuse   hypokinesis. Abnormal global longitudinal strain of -12.2%. The   study was not technically sufficient to allow evaluation of LV   diastolic dysfunction due to atrial fibrillation. - Aortic valve: Mildly calcified annulus. Trileaflet. - Mitral valve: Mildly calcified annulus. There was mild   regurgitation. - Right ventricle: Pacer wire or catheter noted in right ventricle. - Right atrium: Pacer wire or catheter noted in right atrium. - Atrial septum: No defect or patent foramen ovale was identified. - Tricuspid valve: There was moderate regurgitation. - Pulmonary arteries: PA peak pressure: 35 mm Hg (S). - Pericardium, extracardiac: There was no pericardial effusion.   Laboratory Data:  High Sensitivity Troponin:   Recent Labs  Lab 03/22/19 1959  TROPONINIHS 8     Chemistry Recent Labs  Lab 04/11/19 1538 04/13/19 0349  NA 138 138  K 3.3* 2.4*  CL 98 100  CO2 26 26  GLUCOSE 134* 120*  BUN 10 <5*  CREATININE 0.80 0.58  CALCIUM 8.9 8.4*  GFRNONAA >60 >60  GFRAA >60 >60  ANIONGAP 14 12    Recent Labs  Lab 04/09/19 1625 04/11/19 1538 04/12/19 0513 04/13/19 0349  PROT 6.6 6.4*  --  5.7*  ALBUMIN  --  2.8*  --  2.4*  AST 202* 211*  --  148*  ALT 133* 131*  --  102*  ALKPHOS  --  469*  --  379*  BILITOT 10.0* 9.5* 8.9* 8.2*   Hematology Recent Labs  Lab 04/09/19 1625 04/11/19 1538 04/13/19 0349  WBC 9.4 9.3 7.7  RBC 4.53 4.20 3.61*  HGB 13.1 12.3 10.6*  HCT 39.8 37.9 31.2*  MCV 87.9 90.2 86.4  MCH 28.9 29.3 29.4  MCHC 32.9 32.5 34.0  RDW 13.8 15.3 14.9  PLT 454* 460* 367   BNPNo results for input(s): BNP, PROBNP in the last 168 hours.  DDimer No results for input(s):  DDIMER in the last 168 hours.   Radiology/Studies:  US Abdomen Limited Ruq  Result Date: 04/12/2019 CLINICAL DATA:  85 year old female with jaundice. EXAM: ULTRASOUND ABDOMEN LIMITED RIGHT UPPER QUADRANT COMPARISON:  CT abdomen pelvis dated 03/23/2019 FINDINGS: Gallbladder: There is sludge within the gallbladder. The gallbladder is distended. No gallbladder wall thickening or pericholecystic fluid. Negative sonographic Murphy's sign. Common bile duct: Diameter: The common bile duct is dilated measuring up to 14 mm. No shadowing stone noted in the visualized CBD. The central portion of the CBD at the head of the pancreas is not well visualized. Liver: The liver is grossly unremarkable as visualized. There is mild intrahepatic biliary ductal dilatation. Portal vein is patent on color Doppler imaging with normal direction of blood flow towards the liver. Other: There is a 13 mm right renal upper pole cyst. There is advanced atherosclerotic calcification of the aorta. IMPRESSION: 1. Distended gallbladder containing sludge. No sonographic findings of acute cholecystitis. 2. Dilated common bile duct. No stone identified in the visualized portion of the CBD. The central CBD at the head of the pancreas is suboptimally visualized. 3.  Aortic Atherosclerosis (ICD10-I70.0). Electronically Signed   By: Anner Crete  M.Hernandez.   On: 04/12/2019 00:41    Assessment and Plan:   Atrial fibrillation with RVR -Patient has known permanent atrial fibrillation, usually rate controlled with metoprolol.  Patient presented for evaluation of jaundice and transaminase.  She has had noted poor appetite recently. -Heart rates in the 90s-100s in the ED.  Metoprolol was initially held due to low normal blood pressure in the ED. -Patient has been given several doses of IV Cardizem bolus 15 mg.  She was started on Cardizem CD 240 mg this morning.  Her beta-blocker has been on hold.  At home she was supposed to be on metoprolol tartrate  100 mg twice daily having been switched from Toprol-XL 50 mg daily during her recent hospitalization, however, the patient was confused and she took both dosing types.  Currently I see an order for Toprol-XL 50 mg daily which appears to be an old dosing.  -Currently heart rates are in the 100-107 range.  Patient is asymptomatic. -We will restart her home metoprolol- Dr. Radford Pax to dose.  -Pt elected to stop warfarin recently.  She has been switched to aspirin 325 mg daily at home, currently on hold.  Hypokalemia -Potassium was 3.3 on presentation, 2.4 today.  Patient received IV potassium 10 mEq x 2 yesterday, 4 runs today and oral potassium 40 mEq this morning. -Will recheck BMet now   Chronic combined systolic and diastolic heart failure -EF 45-50% by echocardiogram in 02/2018, diffuse hypokinesis, GLS -12.2% -Symptomatically stable on no diuretics.  CAD -History of CABG in the 1980s -No anginal symptoms -Statin on hold due to acute liver disease  Jaundice/transaminase -Primary team: AST 211, ALT 131, and total bilirubin 9.5 with CBD dilation on [place] but no etiology identified.  GI has been consulted.  Plan for ERCP.  Permanent pacemaker in situ for symptomatic bradycardia -Followed by Dr. Rayann Heman  Hypothyroidism -TSH within normal range on 03/22/2019 -Continues on levothyroxine    For questions or updates, please contact [place] Please consult [email or web address] for contact info under     Signed, Daune Perch, NP  04/13/2019 2:54 PM

## 2019-04-13 NOTE — H&P (View-Only) (Signed)
Progress Note    ASSESSMENT AND PLAN:    85 yo female with obstructive jaundice.  Progression of jaundice over last two weeks. No pancreatic mass on CT scan earlier this month. RUQ today >>> gallbladder sludge, CBD dilation to14 mm without visible stone. Rule out choledocholithiasis. If malignancy would not think jaundice would progress this quickly, especially in absence of mass on CT scan.  -Some improvement in liver tests overnight.  -ERCP / EUS this am had to be postponed - in Afib with RVR. Hypokalemic. Potassium repletion in progress. Will give diet, make NPO after MN and plan for procedures to be done tomorrow.       SUBJECTIVE   Sleeping. Didn't realized she wasn't getting procedures done today. Asked about going home.   OBJECTIVE:     Vital signs in last 24 hours: Temp:  [97.7 F (36.5 C)-98.7 F (37.1 C)] 98.3 F (36.8 C) (10/30 0801) Pulse Rate:  [76-123] 117 (10/30 0801) Resp:  [15-26] 18 (10/30 0801) BP: (106-149)/(55-130) 106/77 (10/30 0801) SpO2:  [95 %-100 %] 99 % (10/30 0801) Weight:  [49.8 kg] 49.8 kg (10/29 2104) Last BM Date: 04/11/19 General:   Jaundiced, in NAD Heart:  Irreg rate and rhythm;  No lower extremity edema   Pulm: Normal respiratory effort. Abdomen:  Soft, nondistended, nontender.  Normal bowel sounds.          Psych:   cooperative.    Intake/Output from previous day: 10/29 0701 - 10/30 0700 In: 100 [IV Piggyback:100] Out: 300 [Urine:300] Intake/Output this shift: Total I/O In: -  Out: 350 [Urine:350]  Lab Results: Recent Labs    04/11/19 1538 04/13/19 0349  WBC 9.3 7.7  HGB 12.3 10.6*  HCT 37.9 31.2*  PLT 460* 367   BMET Recent Labs    04/11/19 1538 04/13/19 0349  NA 138 138  K 3.3* 2.4*  CL 98 100  CO2 26 26  GLUCOSE 134* 120*  BUN 10 <5*  CREATININE 0.80 0.58  CALCIUM 8.9 8.4*   LFT Recent Labs    04/12/19 0513 04/13/19 0349  PROT  --  5.7*  ALBUMIN  --  2.4*  AST  --  148*  ALT  --  102*  ALKPHOS   --  379*  BILITOT 8.9* 8.2*  BILIDIR 5.5*  --   IBILI 3.4*  --    PT/INR Recent Labs    04/12/19 0800 04/13/19 0349  LABPROT 14.6 15.4*  INR 1.2 1.2   Hepatitis Panel Recent Labs    04/12/19 0513  HEPBSAG NON REACTIVE  HCVAB NON REACTIVE  HEPAIGM NON REACTIVE  HEPBIGM NON REACTIVE    US Abdomen Limited Ruq  Result Date: 04/12/2019 CLINICAL DATA:  85 year old female with jaundice. EXAM: ULTRASOUND ABDOMEN LIMITED RIGHT UPPER QUADRANT COMPARISON:  CT abdomen pelvis dated 03/23/2019 FINDINGS: Gallbladder: There is sludge within the gallbladder. The gallbladder is distended. No gallbladder wall thickening or pericholecystic fluid. Negative sonographic Murphy's sign. Common bile duct: Diameter: The common bile duct is dilated measuring up to 14 mm. No shadowing stone noted in the visualized CBD. The central portion of the CBD at the head of the pancreas is not well visualized. Liver: The liver is grossly unremarkable as visualized. There is mild intrahepatic biliary ductal dilatation. Portal vein is patent on color Doppler imaging with normal direction of blood flow towards the liver. Other: There is a 13 mm right renal upper pole cyst. There is advanced atherosclerotic calcification of the aorta. IMPRESSION:  1. Distended gallbladder containing sludge. No sonographic findings of acute cholecystitis. 2. Dilated common bile duct. No stone identified in the visualized portion of the CBD. The central CBD at the head of the pancreas is suboptimally visualized. 3.  Aortic Atherosclerosis (ICD10-I70.0). Electronically Signed   By: Anner Crete M.D.   On: 04/12/2019 00:41     Principal Problem:   Painless jaundice Active Problems:   Atrial fibrillation (HCC)   CAD (coronary artery disease)   Hypothyroidism     LOS: 1 day   Tye Savoy ,NP 04/13/2019, 12:35 PM

## 2019-04-13 NOTE — Progress Notes (Signed)
Patient Heart rate was sustaining in the 130s went as high as 180s on the [facility]. DO was notify orders were put in and patient will now be transferred to [facility].

## 2019-04-13 NOTE — Progress Notes (Signed)
PROGRESS NOTE  Angelica Hernandez [id number] DOB: 11-Dec-1933 DOA: 04/11/2019 PCP: Angelica Bis, MD  Brief History   Patient is 85 year old lady with history of CAD, CABG, symptomatic bradycardia with pacer, chronic systolic congestive heart failure who was recently hospitalized due to GI bleed where her anticoagulation was stopped.  She was found to have slightly elevated bilirubin with dilated CBD but no stone.  Reportedly, plan was to do EUS as outpatient by GI however her basic labs were repeated by her PCP yesterday and her bilirubin jumped to over 10 so she was advised to come to the emergency department.  Patient complains of generalized weakness but no other complaint.  Denies any chest pain, shortness of breath, fever, abdominal pain but does endorse having some nausea and intermittent vomiting since last 2 days.  The patient has developed Atrial Fibrillation with RVR. She has not responded to her usual 240 mg Diltiazem CD. I have consulted cardiology and transferred the patient to [facility].  Consultants  . Gastroenterology . Cardiology  Procedures  Noen  Antibiotics   Anti-infectives (From admission, onward)   None    .  Subjective  The patient is resting comfortably. She is very jaundiced. No new complaints.  Objective   Vitals:  Vitals:   04/13/19 0801 04/13/19 1436  BP: 106/77 107/69  Pulse: (!) 117 95  Resp: 18 15  Temp: 98.3 F (36.8 C) 98 F (36.7 C)  SpO2: 99% 99%   Exam:  Constitutional:  . The patient is awake, alert, and oriented x 3. No acute distress. Respiratory:  . No increased work of breathing. . No wheezes, rales, or rhonchi . No tactile fremitus Cardiovascular:  . The rate is irregular and rapid . No murmurs, ectopy, or gallups. . No lateral PMI. No thrills. Abdomen:  . Abdomen is soft, non-tender, non-distended . No hernias, masses, or organomegaly . Normoactive bowel sounds.  Musculoskeletal:  . No cyanosis, clubbing, or edema Skin:   . No rashes, lesions, ulcers . The patient is jaundiced . palpation of skin: no induration or nodules Neurologic:  . CN 2-12 intact . Sensation all 4 extremities intact Psychiatric:  . Mental status o Mood, affect appropriate o Orientation to person, place, time  . judgment and insight appear intact   I have personally reviewed the following:   Today's Data  . Vitals, CMP, CBC  Cardiology Data  . EKG  Scheduled Meds: . diltiazem  240 mg Oral Daily  . levothyroxine  50 mcg Oral QAC breakfast  . pantoprazole  40 mg Oral Daily  . sodium chloride flush  3 mL Intravenous Q12H  . sodium chloride flush  3 mL Intravenous Q12H   Continuous Infusions: . sodium chloride      Principal Problem:   Painless jaundice Active Problems:   Atrial fibrillation (HCC)   CAD (coronary artery disease)   Hypothyroidism   LOS: 1 day   A & P   Atrial fibrillation with RVR: Patient's heart rate is not responding to PO Diltiazem CD 240 mg, although blood pressure is now low. Cardiology consulted and the patient has been transferred to [facility].   Painless jaundice: Gastroenterology has been consulted. Possibly jaundice is secondary to an obstructive process. The plan is for ERCP with EUS tomorrow. The patient is unable to have an MRI or MRCP due to pacemaker.   CAD: Noted and stable. Monitor on telemetry. ASA held due to ERCP tomorrow. Continue lopressor as PTA. No chest pain.  Hypothyroidism: Continue synthroid  as at home.  Ischemic cardiomyopathy: Noted. EF 45-50% inj 02/2018. Monitor volume status carefully.   I have seen and examined this patient myself.  I have spent 38 minutes in her evaluation and care.  Angelica Trawick, DO [facility] Direct contact: see [email or web address]  7PM-7AM contact night coverage as above 04/13/2019, 2:54 PM  LOS: 1 day

## 2019-04-13 NOTE — Progress Notes (Signed)
Patient arrived on the unit from [facility] on a hospital bed, assessment completed see flowsheet, placed on tele ccmd notified, patient oriented to room and staff, bed in lowest position, call bell within reach will continue to monitor.

## 2019-04-14 ENCOUNTER — Inpatient Hospital Stay (HOSPITAL_COMMUNITY): Payer: Medicare Other

## 2019-04-14 ENCOUNTER — Inpatient Hospital Stay (HOSPITAL_COMMUNITY): Payer: Medicare Other | Admitting: Anesthesiology

## 2019-04-14 ENCOUNTER — Encounter (HOSPITAL_COMMUNITY): Payer: Self-pay | Admitting: Certified Registered"

## 2019-04-14 ENCOUNTER — Encounter (HOSPITAL_COMMUNITY)
Admission: EM | Disposition: A | Discharge status: Home / Self Care | Payer: Self-pay | Source: Home / Self Care | Attending: Internal Medicine

## 2019-04-14 DIAGNOSIS — E039 Hypothyroidism, unspecified: Secondary | ICD-10-CM

## 2019-04-14 DIAGNOSIS — Z9911 Dependence on respirator [ventilator] status: Secondary | ICD-10-CM

## 2019-04-14 DIAGNOSIS — K8050 Calculus of bile duct without cholangitis or cholecystitis without obstruction: Secondary | ICD-10-CM

## 2019-04-14 DIAGNOSIS — K839 Disease of biliary tract, unspecified: Secondary | ICD-10-CM

## 2019-04-14 HISTORY — PX: HEMOSTASIS CLIP PLACEMENT: SHX6857

## 2019-04-14 HISTORY — PX: BILIARY DILATION: SHX6850

## 2019-04-14 HISTORY — PX: PANCREATIC STENT PLACEMENT: SHX5539

## 2019-04-14 HISTORY — PX: BIOPSY: SHX5522

## 2019-04-14 HISTORY — PX: REMOVAL OF STONES: SHX5545

## 2019-04-14 HISTORY — PX: EUS: SHX5427

## 2019-04-14 HISTORY — PX: ESOPHAGOGASTRODUODENOSCOPY (EGD) WITH PROPOFOL: SHX5813

## 2019-04-14 HISTORY — PX: ERCP: SHX5425

## 2019-04-14 HISTORY — PX: SPHINCTEROTOMY: SHX5544

## 2019-04-14 HISTORY — PX: BILIARY STENT PLACEMENT: SHX5538

## 2019-04-14 LAB — POCT I-STAT 7, (LYTES, BLD GAS, ICA,H+H)
Bicarbonate: 23.3 mmol/L (ref 20.0–28.0)
Calcium, Ion: 1.16 mmol/L (ref 1.15–1.40)
HCT: 35 % — ABNORMAL LOW (ref 36.0–46.0)
Hemoglobin: 11.9 g/dL — ABNORMAL LOW (ref 12.0–15.0)
O2 Saturation: 100 %
Patient temperature: 98.3
Potassium: 3.9 mmol/L (ref 3.5–5.1)
Sodium: 132 mmol/L — ABNORMAL LOW (ref 135–145)
TCO2: 24 mmol/L (ref 22–32)
pCO2 arterial: 33 mmHg (ref 32.0–48.0)
pH, Arterial: 7.455 — ABNORMAL HIGH (ref 7.350–7.450)
pO2, Arterial: 221 mmHg — ABNORMAL HIGH (ref 83.0–108.0)

## 2019-04-14 LAB — COMPREHENSIVE METABOLIC PANEL
ALT: 102 U/L — ABNORMAL HIGH (ref 0–44)
AST: 148 U/L — ABNORMAL HIGH (ref 15–41)
Albumin: 2.4 g/dL — ABNORMAL LOW (ref 3.5–5.0)
Alkaline Phosphatase: 379 U/L — ABNORMAL HIGH (ref 38–126)
Anion gap: 12 (ref 5–15)
BUN: <5 mg/dL — ABNORMAL LOW (ref 8–23)
CO2: 26 mmol/L (ref 22–32)
Calcium: 8.4 mg/dL — ABNORMAL LOW (ref 8.9–10.3)
Chloride: 100 mmol/L (ref 98–111)
Creatinine, Ser: 0.58 mg/dL (ref 0.44–1.00)
GFR calc Af Amer: >60 mL/min (ref >60)
GFR calc non Af Amer: >60 mL/min (ref >60)
Glucose, Bld: 120 mg/dL — ABNORMAL HIGH (ref 70–99)
Potassium: 2.4 mmol/L — CL (ref 3.5–5.1)
Sodium: 138 mmol/L (ref 135–145)
Total Bilirubin: 8.2 mg/dL — ABNORMAL HIGH (ref 0.3–1.2)
Total Protein: 5.7 g/dL — ABNORMAL LOW (ref 6.5–8.1)

## 2019-04-14 LAB — CBC WITH DIFFERENTIAL/PLATELET
Abs Immature Granulocytes: 0.02 10*3/uL (ref 0.00–0.07)
Basophils Absolute: 0.1 10*3/uL (ref 0.0–0.1)
Basophils Relative: 1 %
Eosinophils Absolute: 0.4 10*3/uL (ref 0.0–0.5)
Eosinophils Relative: 6 %
HCT: 30.3 % — ABNORMAL LOW (ref 36.0–46.0)
Hemoglobin: 10.3 g/dL — ABNORMAL LOW (ref 12.0–15.0)
Immature Granulocytes: 0 %
Lymphocytes Relative: 25 %
Lymphs Abs: 1.7 10*3/uL (ref 0.7–4.0)
MCH: 29.1 pg (ref 26.0–34.0)
MCHC: 34 g/dL (ref 30.0–36.0)
MCV: 85.6 fL (ref 80.0–100.0)
Monocytes Absolute: 0.8 10*3/uL (ref 0.1–1.0)
Monocytes Relative: 12 %
Neutro Abs: 3.9 10*3/uL (ref 1.7–7.7)
Neutrophils Relative %: 56 %
Platelets: 326 10*3/uL (ref 150–400)
RBC: 3.54 MIL/uL — ABNORMAL LOW (ref 3.87–5.11)
RDW: 15.2 % (ref 11.5–15.5)
WBC: 6.9 10*3/uL (ref 4.0–10.5)
nRBC: 0 % (ref 0.0–0.2)

## 2019-04-14 LAB — BASIC METABOLIC PANEL
Anion gap: 13 (ref 5–15)
BUN: 8 mg/dL (ref 8–23)
CO2: 24 mmol/L (ref 22–32)
Calcium: 8.9 mg/dL (ref 8.9–10.3)
Chloride: 101 mmol/L (ref 98–111)
Creatinine, Ser: 0.63 mg/dL (ref 0.44–1.00)
GFR calc Af Amer: >60 mL/min (ref >60)
GFR calc non Af Amer: >60 mL/min (ref >60)
Glucose, Bld: 123 mg/dL — ABNORMAL HIGH (ref 70–99)
Potassium: 3.4 mmol/L — ABNORMAL LOW (ref 3.5–5.1)
Sodium: 138 mmol/L (ref 135–145)

## 2019-04-14 SURGERY — ERCP, WITH INTERVENTION IF INDICATED
Anesthesia: General

## 2019-04-14 MED ORDER — PHENYLEPHRINE HCL-NACL 10-0.9 MG/250ML-% IV SOLN
INTRAVENOUS | Status: DC | PRN
  Administered 2019-04-14: 80 ug/min via INTRAVENOUS

## 2019-04-14 MED ORDER — IOHEXOL 300 MG/ML  SOLN
100.0000 mL | Freq: Once | INTRAMUSCULAR | Status: AC | PRN
  Administered 2019-04-14: 100 mL via INTRAVENOUS

## 2019-04-14 MED ORDER — DILTIAZEM LOAD VIA INFUSION
INTRAVENOUS | Status: DC | PRN
  Administered 2019-04-14: 5 mg via INTRAVENOUS

## 2019-04-14 MED ORDER — ORAL CARE MOUTH RINSE
15.0000 mL | Freq: Two times a day (BID) | OROMUCOSAL | Status: DC
  Administered 2019-04-15 – 2019-04-17 (×3): 15 mL via OROMUCOSAL

## 2019-04-14 MED ORDER — LACTATED RINGERS IV SOLN
INTRAVENOUS | Status: DC | PRN
  Administered 2019-04-14: 13:00:00 via INTRAVENOUS

## 2019-04-14 MED ORDER — DILTIAZEM HCL-DEXTROSE 125-5 MG/125ML-% IV SOLN (PREMIX): 5.0000 mg/h | INTRAVENOUS | Status: DC

## 2019-04-14 MED ORDER — ORAL CARE MOUTH RINSE: 15.0000 mL | Freq: Two times a day (BID) | OROMUCOSAL | Status: DC

## 2019-04-14 MED ORDER — FAMOTIDINE 40 MG/5ML PO SUSR: 20.0000 mg | Freq: Two times a day (BID) | ORAL | Status: DC

## 2019-04-14 MED ORDER — GLUCAGON HCL RDNA (DIAGNOSTIC) 1 MG IJ SOLR
INTRAMUSCULAR | Status: DC | PRN
  Administered 2019-04-14: .5 mL via INTRAVENOUS
  Administered 2019-04-14: .25 mL via INTRAVENOUS
  Administered 2019-04-14: .5 mL via INTRAVENOUS
  Administered 2019-04-14: .25 mL via INTRAVENOUS

## 2019-04-14 MED ORDER — PANTOPRAZOLE SODIUM 40 MG IV SOLR: 40.0000 mg | Freq: Two times a day (BID) | INTRAVENOUS | Status: DC

## 2019-04-14 MED ORDER — LACTATED RINGERS IV SOLN
INTRAVENOUS | Status: DC | PRN
  Administered 2019-04-14: 10:00:00 via INTRAVENOUS

## 2019-04-14 MED ORDER — FENTANYL BOLUS VIA INFUSION
25.0000 ug | INTRAVENOUS | Status: DC | PRN
  Filled 2019-04-14: qty 25

## 2019-04-14 MED ORDER — ONDANSETRON HCL 4 MG/2ML IJ SOLN
INTRAMUSCULAR | Status: DC | PRN
  Administered 2019-04-14: 4 mg via INTRAVENOUS

## 2019-04-14 MED ORDER — LIDOCAINE 2% (20 MG/ML) 5 ML SYRINGE
INTRAMUSCULAR | Status: DC | PRN
  Administered 2019-04-14: 40 mg via INTRAVENOUS

## 2019-04-14 MED ORDER — METOPROLOL TARTRATE 5 MG/5ML IV SOLN
INTRAVENOUS | Status: DC | PRN
  Administered 2019-04-14 (×2): 2.5 mg via INTRAVENOUS

## 2019-04-14 MED ORDER — FENTANYL CITRATE (PF) 100 MCG/2ML IJ SOLN
INTRAMUSCULAR | Status: DC | PRN
  Administered 2019-04-14: 25 ug via INTRAVENOUS
  Administered 2019-04-14: 75 ug via INTRAVENOUS

## 2019-04-14 MED ORDER — POTASSIUM CHLORIDE 10 MEQ/100ML IV SOLN: 10.0000 meq | INTRAVENOUS | Status: AC

## 2019-04-14 MED ORDER — CHLORHEXIDINE GLUCONATE 0.12 % MT SOLN
15.0000 mL | Freq: Two times a day (BID) | OROMUCOSAL | Status: DC
  Administered 2019-04-14: 15 mL via OROMUCOSAL
  Filled 2019-04-14: qty 15

## 2019-04-14 MED ORDER — POTASSIUM CHLORIDE 10 MEQ/100ML IV SOLN
10.0000 meq | INTRAVENOUS | Status: DC
  Filled 2019-04-14: qty 100

## 2019-04-14 MED ORDER — FENTANYL CITRATE (PF) 100 MCG/2ML IJ SOLN
50.0000 ug | INTRAMUSCULAR | Status: DC | PRN
  Administered 2019-04-14: 100 ug via INTRAVENOUS

## 2019-04-14 MED ORDER — METOPROLOL TARTRATE 50 MG PO TABS
50.0000 mg | ORAL_TABLET | Freq: Four times a day (QID) | ORAL | Status: DC
  Administered 2019-04-14 – 2019-04-16 (×6): 50 mg via ORAL
  Filled 2019-04-14 (×6): qty 1

## 2019-04-14 MED ORDER — CHLORHEXIDINE GLUCONATE CLOTH 2 % EX PADS
6.0000 | MEDICATED_PAD | Freq: Every day | CUTANEOUS | Status: DC
  Administered 2019-04-14 – 2019-04-17 (×4): 6 via TOPICAL

## 2019-04-14 MED ORDER — PANTOPRAZOLE SODIUM 40 MG IV SOLR
40.0000 mg | Freq: Two times a day (BID) | INTRAVENOUS | Status: DC
  Administered 2019-04-14: 40 mg via INTRAVENOUS
  Filled 2019-04-14: qty 40

## 2019-04-14 MED ORDER — PROPOFOL 1000 MG/100ML IV EMUL
0.0000 ug/kg/min | INTRAVENOUS | Status: DC
  Administered 2019-04-14: 50 ug/kg/min via INTRAVENOUS

## 2019-04-14 MED ORDER — INDOMETHACIN 50 MG RE SUPP
RECTAL | Status: AC
  Filled 2019-04-14: qty 1

## 2019-04-14 MED ORDER — INDOMETHACIN 50 MG RE SUPP
RECTAL | Status: DC | PRN
  Administered 2019-04-14: 100 mg via RECTAL

## 2019-04-14 MED ORDER — FENTANYL CITRATE (PF) 100 MCG/2ML IJ SOLN
25.0000 ug | Freq: Once | INTRAMUSCULAR | Status: AC
  Administered 2019-04-14: 25 ug via INTRAVENOUS

## 2019-04-14 MED ORDER — GLUCAGON HCL RDNA (DIAGNOSTIC) 1 MG IJ SOLR
INTRAMUSCULAR | Status: AC
  Filled 2019-04-14: qty 1

## 2019-04-14 MED ORDER — ESMOLOL HCL 100 MG/10ML IV SOLN
INTRAVENOUS | Status: DC | PRN
  Administered 2019-04-14: 40 mg via INTRAVENOUS

## 2019-04-14 MED ORDER — FENTANYL CITRATE (PF) 100 MCG/2ML IJ SOLN
50.0000 ug | INTRAMUSCULAR | Status: DC | PRN
  Administered 2019-04-14: 50 ug via INTRAVENOUS

## 2019-04-14 MED ORDER — PROPOFOL 10 MG/ML IV BOLUS
INTRAVENOUS | Status: DC | PRN
  Administered 2019-04-14: 100 mg via INTRAVENOUS

## 2019-04-14 MED ORDER — DEXAMETHASONE SODIUM PHOSPHATE 10 MG/ML IJ SOLN
INTRAMUSCULAR | Status: DC | PRN
  Administered 2019-04-14: 5 mg via INTRAVENOUS

## 2019-04-14 MED ORDER — SODIUM CHLORIDE 0.9 % IV SOLN
1.5000 g | Freq: Once | INTRAVENOUS | Status: AC
  Administered 2019-04-14: 1.5 g via INTRAVENOUS
  Filled 2019-04-14: qty 4

## 2019-04-14 MED ORDER — SODIUM CHLORIDE 0.9 % IV SOLN
INTRAVENOUS | Status: DC | PRN
  Administered 2019-04-14: 70 mL

## 2019-04-14 MED ORDER — PHENYLEPHRINE 40 MCG/ML (10ML) SYRINGE FOR IV PUSH (FOR BLOOD PRESSURE SUPPORT)
PREFILLED_SYRINGE | INTRAVENOUS | Status: DC | PRN
  Administered 2019-04-14: 160 ug via INTRAVENOUS

## 2019-04-14 MED ORDER — METOPROLOL TARTRATE 5 MG/5ML IV SOLN
INTRAVENOUS | Status: AC
  Filled 2019-04-14: qty 5

## 2019-04-14 MED ORDER — ROCURONIUM BROMIDE 10 MG/ML (PF) SYRINGE
PREFILLED_SYRINGE | INTRAVENOUS | Status: DC | PRN
  Administered 2019-04-14: 50 mg via INTRAVENOUS
  Administered 2019-04-14: 30 mg via INTRAVENOUS

## 2019-04-14 MED ORDER — FENTANYL 2500MCG IN NS 250ML (10MCG/ML) PREMIX INFUSION
25.0000 ug/h | INTRAVENOUS | Status: DC
  Administered 2019-04-14: 150 ug/h via INTRAVENOUS
  Filled 2019-04-14: qty 250

## 2019-04-14 MED ORDER — PIPERACILLIN-TAZOBACTAM 3.375 G IVPB
3.3750 g | Freq: Three times a day (TID) | INTRAVENOUS | Status: DC
  Filled 2019-04-14: qty 50

## 2019-04-14 MED ORDER — INDOMETHACIN 50 MG RE SUPP: 100.0000 mg | Freq: Once | RECTAL | Status: DC

## 2019-04-14 MED ORDER — PIPERACILLIN-TAZOBACTAM 3.375 G IVPB
3.3750 g | Freq: Three times a day (TID) | INTRAVENOUS | Status: DC
  Administered 2019-04-14 – 2019-04-18 (×12): 3.375 g via INTRAVENOUS
  Filled 2019-04-14 (×12): qty 50

## 2019-04-14 MED ORDER — POTASSIUM CHLORIDE 20 MEQ PO PACK
20.0000 meq | PACK | Freq: Once | ORAL | Status: AC
  Administered 2019-04-14: 15:00:00 20 meq
  Filled 2019-04-14: qty 1

## 2019-04-14 MED ORDER — GLUCAGON HCL RDNA (DIAGNOSTIC) 1 MG IJ SOLR
INTRAMUSCULAR | Status: AC
  Filled 2019-04-14: qty 2

## 2019-04-14 MED ORDER — FENTANYL CITRATE (PF) 100 MCG/2ML IJ SOLN: 12.5000 ug | INTRAMUSCULAR | Status: DC | PRN

## 2019-04-14 MED ORDER — CHLORHEXIDINE GLUCONATE 0.12 % MT SOLN
15.0000 mL | Freq: Two times a day (BID) | OROMUCOSAL | Status: DC
  Administered 2019-04-15 – 2019-04-18 (×5): 15 mL via OROMUCOSAL
  Filled 2019-04-14 (×8): qty 15

## 2019-04-14 MED ORDER — DILTIAZEM HCL-DEXTROSE 125-5 MG/125ML-% IV SOLN (PREMIX)
5.0000 mg/h | INTRAVENOUS | Status: DC
  Administered 2019-04-14: 5 mg/h via INTRAVENOUS
  Filled 2019-04-14: qty 125

## 2019-04-14 MED ORDER — PIPERACILLIN-TAZOBACTAM 3.375 G IVPB 30 MIN: 3.3750 g | Freq: Three times a day (TID) | INTRAVENOUS | Status: DC

## 2019-04-14 NOTE — Progress Notes (Signed)
PROGRESS NOTE  Angelica Hernandez [id number] DOB: 03-Oct-1933 DOA: 04/11/2019 PCP: Caryl Bis, MD  Brief History   Patient is 85 year old lady with history of CAD, CABG, symptomatic bradycardia with pacer, chronic systolic congestive heart failure who was recently hospitalized due to GI bleed where her anticoagulation was stopped.  She was found to have slightly elevated bilirubin with dilated CBD but no stone.  Reportedly, plan was to do EUS as outpatient by GI however her basic labs were repeated by her PCP yesterday and her bilirubin jumped to over 10 so she was advised to come to the emergency department.  Patient complains of generalized weakness but no other complaint.  Denies any chest pain, shortness of breath, fever, abdominal pain but does endorse having some nausea and intermittent vomiting since last 2 days.  The patient has developed Atrial Fibrillation with RVR. She has not responded to her usual 240 mg Diltiazem CD. I consulted cardiology and transferred the patient to 4E.  On 04/14/2019 the patient underwent ERCP with Dr. Early Osmond. A possible perforation of the duodenum was discovered that was in the area that was of concern for bile leak. The patient also developed Atrial Fibrillation with RVR. She was returned to the ICU on the ventilator. Stat CT abdomen is pending, and general surgery has been consulted. Dr. Early Osmond has discussed the patient with her family.  Consultants  . Gastroenterology . Cardiology . General Surgery  Procedures  ERCP, stent placement  Antibiotics   Anti-infectives (From admission, onward)   Start     Dose/Rate Route Frequency Ordered Stop   04/14/19 0715  ampicillin-sulbactam (UNASYN) 1.5 g in sodium chloride 0.9 % 100 mL IVPB     1.5 g 200 mL/hr over 30 Minutes Intravenous  Once 04/14/19 0707 04/14/19 1012     Subjective  The patient has been transferred to the ICU on a diltiazem drip and on mechanical ventilation. She is sedated.   Objective   Vitals:  Vitals:   04/14/19 0837 04/14/19 1240  BP: 119/80 (!) 147/78  Pulse:  (!) 120  Resp: 18 15  Temp: 98.9 F (37.2 C)   SpO2: 100% 100%   Exam:  Constitutional:  . The patient is sedated and intubated. Respiratory:  . No increased work of breathing. . No wheezes, rales, or rhonchi . No tactile fremitus Cardiovascular:  . The rate is irregular and rapid . No murmurs, ectopy, or gallups. . No lateral PMI. No thrills. Abdomen:  . Abdomen is soft, non-tender, non-distended . No hernias, masses, or organomegaly . Hypoactive bowel sounds.  Musculoskeletal:  . No cyanosis, clubbing, or edema Skin:  . No rashes, lesions, ulcers . The patient is jaundiced . palpation of skin: no induration or nodules Neurologic:  . CN 2-12 intact . Sensation all 4 extremities intact Psychiatric: Pt is unable to cooperate with examination.  I have personally reviewed the following:   Today's Data  . Vitals, CBC, BMP  Cardiology Data  . EKG  Scheduled Meds: . indomethacin  100 mg Rectal Once  . levothyroxine  50 mcg Oral QAC breakfast  . metoprolol tartrate  25 mg Oral Q6H  . pantoprazole  40 mg Oral Daily  . sodium chloride flush  3 mL Intravenous Q12H  . sodium chloride flush  3 mL Intravenous Q12H   Continuous Infusions: . sodium chloride    . diltiazem (CARDIZEM) infusion 5 mg/hr (04/14/19 1117)    Principal Problem:   Painless jaundice Active Problems:   Atrial fibrillation ([place])  CAD (coronary artery disease)   Hypothyroidism   DCM (dilated cardiomyopathy) (H[facility])   LOS: 2 days   A & P   Atrial fibrillation with RVR: High heart rate again during procedure today. Pt is returned to ICU on mechanical ventialtion and diltiazem drip. Cardiology consulted and the patient has been transferred to [facility].   Painless jaundice: Gastroenterology has been consulted. Possibly jaundice is secondary to an obstructive process. The patient underwent ERCP with biliary  stent placement today. The patient is unable to have an MRI or MRCP due to pacemaker.   Duodenal perforation with concern for bile leak: Discovered during ERCP. Pt mechanically intubated and sedated pending results of CT abdomen and pelvis. General surgery is aware.   CAD: Noted and stable. Monitor on telemetry. ASA held due to ERCP tomorrow. Continue lopressor as PTA. No chest pain.  Hypothyroidism: Continue synthroid as at home.  Ischemic cardiomyopathy: Noted. EF 45-50% inj 02/2018. Monitor volume status carefully.   I have seen and examined this patient myself.  I have spent 48 minutes in her evaluation and care.  Lyrika Souders, DO [facility] Direct contact: see [email or web address]  7PM-7AM contact night coverage as above 04/14/2019, 1:42 PM  LOS: 1 day

## 2019-04-14 NOTE — Op Note (Signed)
[facility] Patient Name: Angelica Hernandez Procedure Date : 04/14/2019 MRN: [id number] Attending MD: Justice Britain , MD Date of Birth: 1933-06-17 CSN: [id number] Age: 85 Admit Type: Inpatient Procedure:                ERCP Indications:              Bile duct stone(s), Biliary dilation on Computed                            Tomogram Scan, Jaundice, Abnormal liver function                            test Providers:                Justice Britain, MD, Josie Dixon, RN, Carlyn Reichert, RN, Lazaro Arms, Technician, William Dalton, Technician Referring MD:             [facility], Critical Care Medicine, Surgery Medicines:                General Anesthesia, Unasyn 1.5 g IV, Indomethacin                            100 mg PR, Glucagon 1 mg IV Complications:            Perforation Estimated Blood Loss:     Estimated blood loss was minimal. Procedure:                Pre-Anesthesia Assessment:                           - Prior to the procedure, a History and Physical                            was performed, and patient medications and                            allergies were reviewed. The patient's tolerance of                            previous anesthesia was also reviewed. The risks                            and benefits of the procedure and the sedation                            options and risks were discussed with the patient.                            All questions were answered, and informed consent  was obtained. Prior Anticoagulants: The patient has                            taken Coumadin (warfarin), last dose was 4 days                            prior to procedure. ASA Grade Assessment: III - A                            patient with severe systemic disease. After                            reviewing the risks and benefits, the patient was   deemed in satisfactory condition to undergo the                            procedure.                           After obtaining informed consent, the scope was                            passed under direct vision. Throughout the                            procedure, the patient's blood pressure, pulse, and                            oxygen saturations were monitored continuously. The                            TJF-Q180V ([id number]) Olympus duodenoscope was                            introduced through the mouth, and used to inject                            contrast into and used to inject contrast into the                            bile duct and ventral pancreatic duct. The                            TJF-Q180V ([id number]) Olympus duodenoscope was                            introduced through the mouth, and used to inject                            contrast into and used to inject contrast into the                            bile duct and ventral pancreatic duct. The GIF-H190                            (  [id number]) Olympus gastroscope was introduced                            through the mouth, and used to inject contrast into                            and used to locate the major papilla. The ERCP was                            performed with moderate difficulty due to                            complication as noted below. Successful completion                            of the procedure was aided by performing the                            maneuvers documented (below) in this report. The                            patient tolerated the procedure. Scope In: Scope Out: Findings:      The scout film was normal.      The esophagus was successfully intubated under direct vision without       detailed examination of the pharynx, larynx, and associated structures,       and upper GI tract. The major papilla was floppy and prominent.      The bile duct could not be cannulated with the  short-nosed traction       sphincterotome. Repeated attempts at biliary cannulation were not       successful while using a wire-guided approach. This led to placement of       the wire within the pancreatic duct on 2 occasions. Decision was made to       pursue a double-wire approach. The wire was left within the pancreatic       duct.      In the semi-long position, a short 0.035 inch Soft Jagwire was able to       be passed into the biliary tree. The short-nosed traction sphincterotome       was passed over the guidewire and the bile duct was then deeply       cannulated. Contrast was injected. I personally interpreted the bile       duct images. Ductal flow of contrast was adequate. Image quality was       adequate. Contrast extended to the hepatic ducts. Opacification of the       entire biliary tree except for the gallbladder was successful. The       maximum diameter of the ducts was 13 mm. The lower third of the main       duct contained filling defects consistent with sludge/stones as per EUS.       A 7 mm biliary sphincterotomy was made with a monofilament traction       (standard) sphincterotome using ERBE electrocautery. There was no       post-sphincterotomy bleeding.      One 4 Fr by 3 cm temporary plastic  pancreatic stent with two external       flaps was placed into the ventral pancreatic duct. Clear fluid flowed       through the stent. The stent was in good position and hopefully will       decrease post-ERCP pancreatitis.      Decision made to pursue a sphincteroplasty to further aid in stone       extraction. Dilation of the distal common bile duct with an 01-20-09 mm       balloon (to a maximum balloon size of 9 mm) dilator was successful. In       retrospect, this area that was dilated was likely further intraduodenal       portion of the ampulla. To discover objects, the biliary tree was swept       with a retrieval balloon starting at the bifurcation. Sludge was swept        from the duct. A few stone fragments were removed. Some oozing was       noted. Upon repeat sweep number 2 with the retrieval balloon, I noted a       perforation. The balloon was deflated. An occlusion cholangiogram was       performed that showed what looked to be Extravasation of contrast       originating from the lower third of the main bile duct into the presumed       retroperitoneum. Patient remained stable. Immediate decision made to       proceed with attempt at closure of the region. One [place] 10 mm by 6 cm       covered metal biliary stent was placed into the common bile duct. Bile       flowed through the stent. The stent was in good position but I felt       there could be some more area in the duodenum that could use fully       covered stenting. At this time an occlusion cholangiogram did not show       further extravasation of the region. I then placed within the previous       FCSEMS, a [place] 10 mm by 4 cm covered metal biliary stent was placed       into the common bile duct and placed a larger portion out of the duct.       Bile flowed through the stent. The stent was in good position. An       occlusion cholangiogram was performed that showed no further significant       biliary pathology and I did not see any further extravasation. To close       the defects surrounding the stent, through the ERCP scope four       hemostatic clips were successfully placed (MR conditional) in the area       of the papilla. There was no bleeding at the end of the procedure.      A pancreatogram was not performed.      The duodenoscope was withdrawn from the patient.      A 16 Fr orogastric tube was placed through the oral cavity into the       esophagus. Under endoscopic guidance, the tube was advanced into the       stomach. Placement was confirmed by scope visualization.      The endoscope was withdrawn from the patient. Impression:               -  The major papilla appeared  to be floppy.                           - Difficult cannulation, but after double wire                            technique was able to cannulate the biliary tree.                           - The fluoroscopic examination was suspicious for                            sludge/stones (noted on EUS done before).                           - Choledocholithiasis and sludge was found. Removal                            was accomplished by biliary sphincterotomy and                            sphincteroplasty.                           - During balloon sweep, a perforation of the bile                            duct and duodenum was noted above the previously                            dilated distal CBD as sphincteroplasty. A biliary                            leak was noted on flurooscopy.                           - Covered metal biliary stents placed to cover the                            presumed leak/perforation.                           - Four hemostatic clips were successfully placed                            (MR conditional) in the area surrounding the                            papilla in effort of closing the defect further.                           - OG tube placement was successfully performed. Recommendation:           - The patient will be observed post-procedure,  until all discharge criteria are met.                           - Patient to remain intubated.                           - Patient to be transferred to ICU for further                            monitoring/care.                           - Consultation with Surgery and [place] for a CTAP with IV/Oral contrast (to be                            given 30 minutes before going for scan) to evaluate                            for free air/perforation/ongoing leak.                           - Even if a leak is noted, may be  reasonable after                            discussion with surgery to monitor, remain NPO, and                            see if this will close on its own.                           - May need a SBFT to evaluate further depending on                            how patient is doing before any initiation of                            nutrition.                           - IV Zosyn or IV ABx of choice per Medical service.                           - The findings and recommendations were discussed                            with the patient's family.                           - The  findings and recommendations were discussed                            with the referring physician. Procedure Code(s):        --- Professional ---                           [phone], Endoscopic retrograde                            cholangiopancreatography (ERCP); with placement of                            endoscopic stent into biliary or pancreatic duct,                            including pre- and post-dilation and guide wire                            passage, when performed, including sphincterotomy,                            when performed, each stent                           43274, 3, Endoscopic retrograde                            cholangiopancreatography (ERCP); with placement of                            endoscopic stent into biliary or pancreatic duct,                            including pre- and post-dilation and guide wire                            passage, when performed, including sphincterotomy,                            when performed, each stent                           19379, 36, Endoscopic retrograde                            cholangiopancreatography (ERCP); with placement of                            endoscopic stent into biliary or pancreatic duct,                            including pre- and post-dilation and guide wire                            passage, when performed,  including sphincterotomy,  when performed, each stent                           43264, Endoscopic retrograde                            cholangiopancreatography (ERCP); with removal of                            calculi/debris from biliary/pancreatic duct(s)                           43241, Esophagogastroduodenoscopy, flexible,                            transoral; with insertion of intraluminal tube or                            catheter                           [phone], Unlisted procedure, biliary tract Diagnosis Code(s):        --- Professional ---                           K83.8, Other specified diseases of biliary tract                           K83.9, Disease of biliary tract, unspecified                           K80.50, Calculus of bile duct without cholangitis                            or cholecystitis without obstruction                           R17, Unspecified jaundice                           R94.5, Abnormal results of liver function studies CPT copyright 2019 American Medical Association. All rights reserved. The codes documented in this report are preliminary and upon coder review may  be revised to meet current compliance requirements. Justice Britain, MD 04/14/2019 2:09:25 PM Number of Addenda: 0

## 2019-04-14 NOTE — Interval H&P Note (Signed)
History and Physical Interval Note:  04/14/2019 8:44 AM  Enid Derry Patella  has presented today for surgery, with the diagnosis of obstructive jaundice.  The various methods of treatment have been discussed with the patient and family. After consideration of risks, benefits and other options for treatment, the patient has consented to  Procedure(s): ENDOSCOPIC RETROGRADE CHOLANGIOPANCREATOGRAPHY (ERCP) (N/A) UPPER ENDOSCOPIC ULTRASOUND (EUS) LINEAR (N/A) as a surgical intervention.  The patient's history has been reviewed, patient examined, no change in status, stable for surgery.  I have reviewed the patient's chart and labs.  Questions were answered to the patient's satisfaction.    The risks of EUS including bleeding, infection, aspiration pneumonia and intestinal perforation were discussed as was the possibility it may not give a definitive diagnosis.  If a biopsy of the pancreas is done as part of the EUS, there is an additional risk of pancreatitis at the rate of about 1%.  It was explained that procedure related pancreatitis is typically mild, although can be severe and even life threatening, which is why we do not perform random pancreatic biopsies and only biopsy a lesion we feel is concerning enough to warrant the risk.  The risks of an ERCP were discussed at length, including but not limited to the risk of perforation, bleeding, abdominal pain, post-ERCP pancreatitis (while usually mild can be severe and even life threatening).    [employer]

## 2019-04-14 NOTE — Progress Notes (Signed)
The procedure notes will be documented in [place] and under the procedure and can be found later this morning/afternoon.  EUS showed evidence of a dilated biliary tree with biliary sludge and stones and a very distended gallbladder.  She had a duodenal ulcer on EGD.  Biopsies were obtained to rule out H. pylori from the stomach.  No evidence of an ampullary mass or lesion in the pancreas head did not have evidence of a mass or lesion either.  There was pancreatic duct dilation however in the head/neck/body with evidence of atrophy.  ERCP was attempted and required double wire to cannulate the biliary tree.  A 7 mm sphincterotomy was performed and subsequently decision made to proceed with a sphincteroplasty with an 01/20/2009 CRE biliary balloon.  We dilated the biliary orifice up to 9 mm for a total of 4 minutes.  Biliary balloon sweeping showed evidence of sludge and debris and stone debris.  On the second biliary pull-through however, a perforation was noted and what likely was still an intraduodenal portion of the ampulla.  Contrast on fluoroscopy was noted to be in that particular region concerning for a potential duodenal leak as well as a bile leak.  Decision made to move forward with fully covered self-expanding metal stent placement.  I placed a 10 mm x 6 cm stent and subsequently a 10 mm x 4 cm stent to fully traverse the biliary orifice.  I injected contrast and did not see anything on cholangiogram to suggest a persistent leak.  I placed 4 clips around the duodenum in effort of trying to decrease risk of persistent perforation.  An NG tube was placed.  Surgery was contacted.  I have updated the patient's daughter at the number that is in her chart Tami Ribas).  She was on her way to [place] from home and eating.  A KUB was obtained in the supine position.  We elected to keep the patient intubated with an NG tube in place to allow decompression from above.  And allow things to be monitored  closely.  The patient did not require any pressor support during the procedure even when she was in A. fib with RVR and on a diltiazem drip.  I have discussed this case with the ICU service as well as with the patient's medicine service as well as with surgery.  The plan will be to move forward with a CT abdomen/pelvis with IV and per NGT contrast placement 30 minutes before going down for the CT.  This will help [place] evaluate for a persistent leak as well as evaluate for free air.  Hopefully anything that occurred was just before the placement of the self-expanding metal stents which hopefully now have closed the area off.  If the CT abdomen/pelvis is unremarkable I think it would be reasonable for [place] to keep her sedated and relaxed for the next 12 to 24 hours at which point if she is remained stable that we can extubate her and subsequently do a small bowel follow-through/upper GI series to further confirm that she has no persistent leak.  She should remain on IV antibiotics in the interim I think Zosyn is reasonable.  She will be at risk of pancreatitis from the double wire technique but hopefully with the pancreatic prophylactic stent we will decrease that risk.  Please keep me up-to-date over the course of the rest of the day into tomorrow.  I will round on her multiple times today.   Justice Britain, MD [facility]  Advanced Endoscopy Office # [phone]

## 2019-04-14 NOTE — Anesthesia Preprocedure Evaluation (Signed)
Anesthesia Evaluation  Patient identified by MRN, date of birth, ID band Patient awake    Reviewed: Allergy & Precautions, NPO status , Patient's Chart, lab work & pertinent test results  Airway Mallampati: II  TM Distance: >3 FB Neck ROM: Full    Dental  (+) Edentulous Upper, Edentulous Lower   Pulmonary Current Smoker and Patient abstained from smoking.,    breath sounds clear to auscultation       Cardiovascular  Rhythm:Irregular Rate:Normal     Neuro/Psych    GI/Hepatic   Endo/Other    Renal/GU      Musculoskeletal   Abdominal   Peds  Hematology   Anesthesia Other Findings Obvious jaundice of skin and scleral icterus.  Reproductive/Obstetrics                             Anesthesia Physical Anesthesia Plan  ASA: III  Anesthesia Plan: General   Post-op Pain Management:    Induction: Intravenous  PONV Risk Score and Plan: Ondansetron and Dexamethasone  Airway Management Planned: Oral ETT  Additional Equipment:   Intra-op Plan:   Post-operative Plan: Extubation in OR  Informed Consent: I have reviewed the patients History and Physical, chart, labs and discussed the procedure including the risks, benefits and alternatives for the proposed anesthesia with the patient or authorized representative who has indicated his/her understanding and acceptance.       Plan Discussed with:   Anesthesia Plan Comments:         Anesthesia Quick Evaluation

## 2019-04-14 NOTE — Anesthesia Postprocedure Evaluation (Signed)
Anesthesia Post Note  Patient: Angelica Hernandez  Procedure(s) Performed: ENDOSCOPIC RETROGRADE CHOLANGIOPANCREATOGRAPHY (ERCP) (N/A ) UPPER ENDOSCOPIC ULTRASOUND (EUS) LINEAR (N/A ) BIOPSY ESOPHAGOGASTRODUODENOSCOPY (EGD) WITH PROPOFOL (N/A ) SPHINCTEROTOMY PANCREATIC STENT PLACEMENT REMOVAL OF STONES BILIARY DILATION BILIARY [place] PLACEMENT     Patient location during evaluation: SICU Anesthesia Type: General Level of consciousness: sedated and patient remains intubated per anesthesia plan Pain management: pain level controlled Vital Signs Assessment: post-procedure vital signs reviewed and stable Respiratory status: patient remains intubated per anesthesia plan and patient on ventilator - see flowsheet for VS Cardiovascular status: stable Anesthetic complications: no    Last Vitals:  Vitals:   04/14/19 2100 04/14/19 2200  BP: 137/78 126/90  Pulse: 88 83  Resp: (!) 22 16  Temp:    SpO2: 100% 99%    Last Pain:  Vitals:   04/14/19 2000  TempSrc: Oral  PainSc: 0-No pain                 Asli Tokarski COKER

## 2019-04-14 NOTE — Consult Note (Signed)
Reason for Consult:Possible perforation of duodenum/ CBD Referring Physician: Mansouraty  Angelica Hernandez is an 85 y.o. female.  HPI: This is an 85 year old female with multiple medical issues who presented with a two-week progression of jaundice.  No pancreatic mass seen on CT scan.  RUQ U/S gallbladder sludge, CBD dilated up to 14 mm.  Her ERCP/EUS had to be postponed due to atrial fibrillation with RVR.  She has been seen by Cardiology and is cleared for the procedure today.   During the EUS/ ERCP, Dr. Rush Landmark noted a probable perforation of the intra-duodenal portion of the ampulla.  Water-soluble contrast showed some leak into the retroperitoneum.  He placed a stent into the distal CBD and another smaller stent to traverse the biliary orifice.  No persistent leak is noted.  We are consulted in case the patient would require surgical intervention.  She remains sedated and intubated.  No signs of sepsis or peritonitis.  Past Medical History:  Diagnosis Date  . Anxiety   . CAD (coronary artery disease)   . CKD (chronic kidney disease)   . Gastro-esophageal reflux disease without esophagitis   . Hypothyroidism   . Ischemic cardiomyopathy   . Jaundice 03/2019  . Mild dilation of ascending aorta (HCC)   . Moderate protein-calorie malnutrition ([place])   . Permanent atrial fibrillation ([place])   . Severe episode of recurrent major depressive disorder, without psychotic features ([place])   . Symptomatic bradycardia     Past Surgical History:  Procedure Laterality Date  . CESAREAN SECTION    . COLONOSCOPY N/A 03/26/2019   Procedure: COLONOSCOPY;  Surgeon: Daneil Dolin, MD;  [facility];  Service: Endoscopy;  Laterality: N/A;  . CORONARY ARTERY BYPASS GRAFT    . ESOPHAGOGASTRODUODENOSCOPY (EGD) WITH PROPOFOL N/A 03/26/2019   Procedure: ESOPHAGOGASTRODUODENOSCOPY (EGD) WITH PROPOFOL;  Surgeon: Daneil Dolin, MD;  [facility];  Service: Endoscopy;  Laterality: N/A;  .  OTHER SURGICAL HISTORY     cesearan section   . PACEMAKER GENERATOR CHANGE  06/15/2017   [employer] Accolade SR L [id number] ([place] [id number]) implanted by Dr Ernestina Patches at [facility] in [place] for symptomatic bradycardia  . PACEMAKER IMPLANT  11/16/2006   PPM implanted for bradycardia    Family History  Problem Relation Age of Onset  . Heart disease Mother   . Hypertension Mother   . Diabetes Mother   . Hypertension Father   . Heart disease Father   . Hypertension Brother     Social History:  reports that she has been smoking cigarettes. She has been smoking about 0.50 packs per day. She has never used smokeless tobacco. She reports that she does not drink alcohol or use drugs.  Allergies: No Known Allergies  Medications:  Prior to Admission medications   Medication Sig Start Date End Date Taking? Authorizing Provider  amoxicillin (AMOXIL) 500 MG capsule Take 500 mg by mouth 2 (two) times daily. For 14 days   Yes [provider]  aspirin EC 325 MG tablet Take 1 tablet (325 mg total) by mouth daily. 03/30/19  Yes Herminio Commons, MD  atorvastatin (LIPITOR) 10 MG tablet Take 10 mg by mouth daily.   Yes [provider]  benazepril (LOTENSIN) 5 MG tablet Take 2.5 mg by mouth daily.   Yes [provider]  Cholecalciferol (VITAMIN D3) 1000 units CAPS Take 1,000 Units by mouth daily.   Yes [provider]  clarithromycin (BIAXIN) 500 MG  tablet Take 500 mg by mouth 2 (two) times daily. For 14 days   Yes [provider]  diltiazem (CARTIA XT) 240 MG 24 hr capsule Take 240 mg by mouth daily.   Yes [provider]  DULoxetine (CYMBALTA) 30 MG capsule Take 1 capsule by mouth daily. 03/01/19  Yes [provider]  FERREX 150 150 MG capsule Take 150 mg by mouth daily.  02/07/18  Yes [provider]  gabapentin (NEURONTIN) 300 MG capsule Take 300 mg by mouth at bedtime. For pain/sleep 01/18/19  Yes  [provider]  lansoprazole (PREVACID) 30 MG capsule Take 30 mg by mouth 2 (two) times daily. 14 day course.   Yes [provider]  levothyroxine (SYNTHROID, LEVOTHROID) 50 MCG tablet Take 50 mcg by mouth daily before breakfast.   Yes [provider]  metoprolol succinate (TOPROL-XL) 50 MG 24 hr tablet Take 50 mg by mouth daily. Take with or immediately following a meal.   Yes [provider]  metoprolol tartrate (LOPRESSOR) 100 MG tablet Take 1 tablet (100 mg total) by mouth 2 (two) times daily. 03/27/19 04/26/19 Yes Shah, Pratik D, DO  nitroGLYCERIN (NITROSTAT) 0.4 MG SL tablet Place 1 tablet (0.4 mg total) under the tongue every 5 (five) minutes as needed for chest pain. 09/18/18  Yes Herminio Commons, MD  pantoprazole (PROTONIX) 40 MG tablet Take 1 tablet (40 mg total) by mouth daily. 03/27/19 03/26/20 Yes Shah, Pratik D, DO  vitamin C (ASCORBIC ACID) 500 MG tablet Take 500 mg by mouth 2 (two) times daily.    Yes [provider]     Results for orders placed or performed during the hospital encounter of 04/11/19 (from the past 48 hour(s))  Comprehensive metabolic panel     Status: Abnormal   Collection Time: 04/13/19  3:49 AM  Result Value Ref Range   Sodium 138 135 - 145 mmol/L   Potassium 2.4 (LL) 3.5 - 5.1 mmol/L    Comment: CRITICAL RESULT CALLED TO, READ BACK BY AND VERIFIED WITH: H DUONKRONG,RN 0547 04/13/2019 WILDERK    Chloride 100 98 - 111 mmol/L   CO2 26 22 - 32 mmol/L   Glucose, Bld 120 (H) 70 - 99 mg/dL   BUN <5 (L) 8 - 23 mg/dL   Creatinine, Ser 0.58 0.44 - 1.00 mg/dL   Calcium 8.4 (L) 8.9 - 10.3 mg/dL   Total Protein 5.7 (L) 6.5 - 8.1 g/dL   Albumin 2.4 (L) 3.5 - 5.0 g/dL   AST 148 (H) 15 - 41 U/L   ALT 102 (H) 0 - 44 U/L   Alkaline Phosphatase 379 (H) 38 - 126 U/L   Total Bilirubin 8.2 (H) 0.3 - 1.2 mg/dL   GFR calc non Af Amer >60 >60 mL/min   GFR calc Af Amer >60 >60 mL/min   Anion gap 12 5 - 15    Comment:  Performed at [facility], [address], [place], [place] [address]  CBC WITH DIFFERENTIAL     Status: Abnormal   Collection Time: 04/13/19  3:49 AM  Result Value Ref Range   WBC 7.7 4.0 - 10.5 K/uL   RBC 3.61 (L) 3.87 - 5.11 MIL/uL   Hemoglobin 10.6 (L) 12.0 - 15.0 g/dL   HCT 31.2 (L) 36.0 - 46.0 %   MCV 86.4 80.0 - 100.0 fL   MCH 29.4 26.0 - 34.0 pg   MCHC 34.0 30.0 - 36.0 g/dL   RDW 14.9 11.5 -  15.5 %   Platelets 367 150 - 400 K/uL   nRBC 0.0 0.0 - 0.2 %   Neutrophils Relative % 63 %   Neutro Abs 4.9 1.7 - 7.7 K/uL   Lymphocytes Relative 19 %   Lymphs Abs 1.5 0.7 - 4.0 K/uL   Monocytes Relative 12 %   Monocytes Absolute 0.9 0.1 - 1.0 K/uL   Eosinophils Relative 5 %   Eosinophils Absolute 0.4 0.0 - 0.5 K/uL   Basophils Relative 1 %   Basophils Absolute 0.1 0.0 - 0.1 K/uL   Immature Granulocytes 0 %   Abs Immature Granulocytes 0.03 0.00 - 0.07 K/uL    Comment: Performed at [facility] [address], [place], [place] [address]  Protime-INR     Status: Abnormal   Collection Time: 04/13/19  3:49 AM  Result Value Ref Range   Prothrombin Time 15.4 (H) 11.4 - 15.2 seconds   INR 1.2 0.8 - 1.2    Comment: (NOTE) INR goal varies based on device and disease states. Performed at [facility], [place] [address] [place], [place] [address]   Glucose, capillary     Status: Abnormal   Collection Time: 04/13/19  6:54 AM  Result Value Ref Range   Glucose-Capillary 102 (H) 70 - 99 mg/dL  Basic metabolic panel     Status: Abnormal   Collection Time: 04/13/19  4:25 PM  Result Value Ref Range   Sodium 136 135 - 145 mmol/L   Potassium 3.5 3.5 - 5.1 mmol/L   Chloride 100 98 - 111 mmol/L   CO2 23 22 - 32 mmol/L   Glucose, Bld 147 (H) 70 - 99 mg/dL   BUN 7 (L) 8 - 23 mg/dL   Creatinine, Ser 0.66 0.44 - 1.00 mg/dL   Calcium 8.7 (L) 8.9 - 10.3 mg/dL   GFR calc non Af Amer >60 >60 mL/min   GFR calc Af Amer >60 >60 mL/min   Anion gap 13 5 - 15    Comment: Performed at  [facility], [place] [address], [place], [place] [id number]  Basic metabolic panel     Status: Abnormal   Collection Time: 04/14/19  3:07 AM  Result Value Ref Range   Sodium 138 135 - 145 mmol/L   Potassium 3.4 (L) 3.5 - 5.1 mmol/L   Chloride 101 98 - 111 mmol/L   CO2 24 22 - 32 mmol/L   Glucose, Bld 123 (H) 70 - 99 mg/dL   BUN 8 8 - 23 mg/dL   Creatinine, Ser 0.63 0.44 - 1.00 mg/dL   Calcium 8.9 8.9 - 10.3 mg/dL   GFR calc non Af Amer >60 >60 mL/min   GFR calc Af Amer >60 >60 mL/min   Anion gap 13 5 - 15    Comment: Performed at [facility], [place] [address] [place] [address]  CBC with Differential/Platelet     Status: Abnormal   Collection Time: 04/14/19  3:07 AM  Result Value Ref Range   WBC 6.9 4.0 - 10.5 K/uL   RBC 3.54 (L) 3.87 - 5.11 MIL/uL   Hemoglobin 10.3 (L) 12.0 - 15.0 g/dL   HCT 30.3 (L) 36.0 - 46.0 %   MCV 85.6 80.0 - 100.0 fL   MCH 29.1 26.0 - 34.0 pg   MCHC 34.0 30.0 - 36.0 g/dL   RDW 15.2 11.5 - 15.5 %   Platelets 326 150 - 400 K/uL   nRBC 0.0 0.0 -  0.2 %   Neutrophils Relative % 56 %   Neutro Abs 3.9 1.7 - 7.7 K/uL   Lymphocytes Relative 25 %   Lymphs Abs 1.7 0.7 - 4.0 K/uL   Monocytes Relative 12 %   Monocytes Absolute 0.8 0.1 - 1.0 K/uL   Eosinophils Relative 6 %   Eosinophils Absolute 0.4 0.0 - 0.5 K/uL   Basophils Relative 1 %   Basophils Absolute 0.1 0.0 - 0.1 K/uL   Immature Granulocytes 0 %   Abs Immature Granulocytes 0.02 0.00 - 0.07 K/uL    Comment: Performed at [facility] [address], [place], [place] [address]    Dg Abd 1 View  Result Date: 04/14/2019 CLINICAL DATA:  Evaluate OG tube EXAM: ABDOMEN - 1 VIEW COMPARISON:  None. FINDINGS: The OG tube terminates within the left upper quadrant, within the stomach. IMPRESSION: The OG tube terminates within the left upper quadrant, within the stomach. Electronically Signed   By: Dorise Bullion III M.D   On: 04/14/2019 13:33   Dg Chest Port 1 View  Result Date:  04/14/2019 CLINICAL DATA:  Evaluate ETT EXAM: PORTABLE CHEST 1 VIEW COMPARISON:  March 22, 2019 FINDINGS: No pneumothorax. An ET tube is been placed, terminating in the mid trachea, in good position. The NG tube terminates below today's film. Since the cardiomediastinal silhouette is unchanged. No nodules or masses. No focal infiltrates. IMPRESSION: 1. Support apparatus as above. 2. No acute abnormalities otherwise seen. Electronically Signed   By: Dorise Bullion III M.D   On: 04/14/2019 13:32   Dg Ercp Biliary & Pancreatic Ducts  Result Date: 04/14/2019 CLINICAL DATA:  Biliary obstruction. EXAM: ERCP TECHNIQUE: Multiple spot images obtained with the fluoroscopic device and submitted for interpretation post-procedure. COMPARISON:  CT of the abdomen on 03/23/2019 FINDINGS: Imaging with a C-arm during the endoscopic procedure demonstrates cannulation of the common bile duct with contrast injection demonstrating dilatation of the common bile duct and apparent focal stricture of the distal common bile duct. Balloon dilatation was performed followed by placement a self expanding stent. IMPRESSION: Findings consistent with focal stricture of the distal common bile duct. Balloon dilatation was performed followed by placement of a self expanding stent. These images were submitted for radiologic interpretation only. Please see the procedural report for the amount of contrast and the fluoroscopy time utilized. Electronically Signed   By: Aletta Edouard M.D.   On: 04/14/2019 12:52   Dg Abd Portable 1v  Result Date: 04/14/2019 CLINICAL DATA:  OG tube placement. Perforated bowel. EXAM: PORTABLE ABDOMEN - 1 VIEW COMPARISON:  CT abdomen dated 03/23/2019. FINDINGS: OG tube appears well positioned in the stomach. Interval placement of a biliary stent which appears appropriately positioned. Visualized bowel gas pattern is nonobstructive. No definite free intraperitoneal air is seen under the diaphragms. Lung bases are  clear. IMPRESSION: 1. OG tube appears well positioned in the stomach. 2. Interval placement of a biliary stent which appears appropriately positioned. 3. Nonobstructive bowel gas pattern. Electronically Signed   By: Franki Cabot M.D.   On: 04/14/2019 13:13    ROS  Unable to obtain - patient intubated and sedated Blood pressure 123/89, pulse 61, temperature 98.9 F (37.2 C), temperature source Oral, resp. rate 15, height 5\' 6"  (1.676 m), weight 46.9 kg, SpO2 100 %. Physical Exam Elderly female - sedated; NAD Eyes:  Pupils equal, round; sclera icteric HENT:  Oral mucosa moist; good dentition  Neck:  No masses palpated, no thyromegaly Lungs:  CTA bilaterally;  normal respiratory effort CV:  Regular rate and rhythm; no murmurs; extremities well-perfused with no edema Abd:  +bowel sounds, soft, non-tender, no palpable organomegaly; no palpable hernias Skin:  Warm, dry; obvious jaundice   Assessment/Plan: Probable iatrogenic perforation at the distal CBD/ ampulla - now covered with stent.    CT scan with water-soluble contrast via IV and NG tube is pending.  If the stents cover the perforation, would manage non-operatively.  Attempting to explore this patient with multiple medical issues would be high-risk for complications and probably poorly tolerated.  Will follow.   Imogene Burn Collis Thede 04/14/2019, 2:29 PM

## 2019-04-14 NOTE — Progress Notes (Signed)
RT NOTE: RT transported patient from 2H13 to CT and back with RN. No complications and VS stable. RT will continue to monitor.

## 2019-04-14 NOTE — Progress Notes (Signed)
Reviewed the CT scan. No evidence of free fluid in abdomen and no free air. Patient is agitated trying to pull tube but does not grimace upon palpation of her abdomen. Hopefully we have sealed this off. Have updated family. If patient is felt to be a candidate for extubation, then I am OK with proceeding with that. Pain control for potential issues is reasonable and she will be at risk of pancreatitis. Appreciate surgery evaluation.  Justice Britain, MD [facility] Office # [phone]

## 2019-04-14 NOTE — Anesthesia Procedure Notes (Signed)
Arterial Line Insertion Start/End10/31/2020 12:18 PM, 04/14/2019 12:22 PM Performed by: Wilburn Cornelia, CRNA, CRNA  Patient location: OOR procedure area. Preanesthetic checklist: patient identified, IV checked, site marked, risks and benefits discussed, surgical consent, monitors and equipment checked, pre-op evaluation and anesthesia consent Left, radial was placed Catheter size: 20 G Hand hygiene performed , maximum sterile barriers used  and Seldinger technique used Allen's test indicative of satisfactory collateral circulation Attempts: 1 Procedure performed without using ultrasound guided technique. Ultrasound Notes:anatomy identified, needle tip was noted to be adjacent to the nerve/plexus identified and no ultrasound evidence of intravascular and/or intraneural injection Following insertion, Biopatch and dressing applied. Post procedure assessment: normal  Patient tolerated the procedure well with no immediate complications.

## 2019-04-14 NOTE — Anesthesia Procedure Notes (Addendum)
Procedure Name: Intubation Date/Time: 04/14/2019 8:41 AM Performed by: Cleda Daub, CRNA Pre-anesthesia Checklist: Patient identified, Emergency Drugs available, Suction available and Patient being monitored Patient Re-evaluated:Patient Re-evaluated prior to induction Oxygen Delivery Method: Circle system utilized Preoxygenation: Pre-oxygenation with 100% oxygen Induction Type: IV induction Ventilation: Mask ventilation without difficulty and Mask ventilation throughout procedure Laryngoscope Size: Miller and 2 Grade View: Grade I Tube type: Oral Tube size: 7.0 mm Number of attempts: 1 Airway Equipment and Method: Stylet Placement Confirmation: ETT inserted through vocal cords under direct vision,  positive ETCO2 and breath sounds checked- equal and bilateral Secured at: 20 cm Tube secured with: Tape Dental Injury: Teeth and Oropharynx as per pre-operative assessment

## 2019-04-14 NOTE — Progress Notes (Signed)
Fentanyl 175 ml wasted.  Witnessed by Bryn Gulling RN

## 2019-04-14 NOTE — Consult Note (Signed)
NAME:  Angelica Hernandez, MRN:  [id number], DOB:  [birth year], LOS: 2 ADMISSION DATE:  04/11/2019, CONSULTATION DATE:  04/14/19 REFERRING MD:  Allison Quarry, MD, CHIEF COMPLAINT:  intubated  Brief History   Possible bile duct perforation with possible extravasation of biliary fluid  History of present illness   Mrs. Angelica Hernandez is an 85 year old woman admitted on 10/29 with painless jaundice x 1 day.  There had been plans for an outpatient endoscopy to evaluate transaminase and bilirubin elevations incidentally noted during her recent hospitalization.  She was sent to the hospital due to worsening LFTs. At admission she had a dilated common bile duct without a visible stone. She underwent ERCP this morning  Past Medical History  Permanent A. Fib, pacemaker in place CKD CAD HFrEF due to ischemic cardiomyopathy GERD Tobacco abuse  Significant Hospital Events   ERCP   Consults:  GI General surgery PCCM  Procedures:  ERCP- sludge in distal CBD; likely rupture during dilation. Subsequent stent placement to traverse the area.   Significant Diagnostic Tests:  ERCP CT abd/ pelvis- no bile duct leak; CBD stent, hepatic steatosis  Micro Data:  sars co-v2 negative  Antimicrobials:  Amp sulbactam 10/31 Pip-tazo 10/31>>  Interim history/subjective:  Transferred to ICU intubated & sedated on propofol Required diltiazem for Afib with RVR- achieved adequate rate control  Objective   Blood pressure 123/89, pulse 61, temperature 98.9 F (37.2 C), temperature source Oral, resp. rate 15, height 5\' 6"  (1.676 m), weight 46.9 kg, SpO2 100 %.    Vent Mode: PRVC FiO2 (%):  [50 %] 50 % Set Rate:  [15 bmp] 15 bmp Vt Set:  [450 mL-470 mL] 470 mL PEEP:  [5 cmH20] 5 cmH20 Plateau Pressure:  [12 cmH20] 12 cmH20   Intake/Output Summary (Last 24 hours) at 04/14/2019 1459 Last data filed at 04/14/2019 1247 Gross per 24 hour  Intake 1290.14 ml  Output 5 ml  Net 1285.14 ml   Filed Weights   04/12/19 2104 04/14/19 0459 04/14/19 0837  Weight: 49.8 kg 46.9 kg 46.9 kg    Examination: General: frail-appearing elderly woman laying in bed intubated and sedated HENT: NCAT, eyes anicteric, ETT Lungs: CTAB, breathing comfortably on the vent. Cardiovascular: irreg irreg rhythm, reg rate. Pacemaker protruding from left chest. Abdomen: soft, NT, ND Extremities: no c/c/e Derm: pallor, no rashes Neuro: RASS -5 GU: foley in place  Resolved Hospital Problem list     Assessment & Plan:  Cholestasis due to biliary obstruction in CBD, s/p ERCP complicated by perforation during dilation -Discussed with Dr. Rush Landmark, we will obtain a CT abd/ pelvis and in discussion with GI and General Surgery. Until a further plan is made, she will remain intubated. -con't pip-tazo -rectal indomethacin for pancreatitis prophylaxis  Acute vent-dependent hypoxic respiratory failure  -LTVV 8cc/kg IBW; goal plateau <30 and driving pressure [id number] -daily SAT & SBT; may be able to perform later today -sedation with fentanyl, propofol PRN -VAP prevention protocol  Chronic Afib; PPM in place. On Bblocker and CCB for rate control, PTA. -diltiazem for rate control -holding AC due to potential risk of needing procedures  Hypokalemia -repleted  Chronic normocytic anemia -con't to monitor; no current indication for transfusion  H/o GERD -con't IV PPI; on prevacid PTA  Hypothyroidism; TSH 1.5 -con't PTA synthroid  Best practice:  Diet: NPO Pain/Anxiety/Delirium protocol (if indicated): ordered VAP protocol (if indicated): ordered DVT prophylaxis: holding due to possible need for surgery GI prophylaxis: PPI Glucose control: n/a Mobility: bedrest Code Status: full Family  Communication: family updated by Dr. Rush Landmark Disposition: ICU  Labs   CBC: Recent Labs  Lab 04/09/19 1625 04/11/19 1538 04/13/19 0349 04/14/19 0307  WBC 9.4 9.3 7.7 6.9  NEUTROABS 6,900  --  4.9 3.9  HGB 13.1 12.3 10.6*  10.3*  HCT 39.8 37.9 31.2* 30.3*  MCV 87.9 90.2 86.4 85.6  PLT 454* 460* 367 [id number]    Basic Metabolic Panel: Recent Labs  Lab 04/11/19 1538 04/13/19 0349 04/13/19 1625 04/14/19 0307  NA 138 138 136 138  K 3.3* 2.4* 3.5 3.4*  CL 98 100 100 101  CO2 26 26 23 24   GLUCOSE 134* 120* 147* 123*  BUN 10 <5* 7* 8  CREATININE 0.80 0.58 0.66 0.63  CALCIUM 8.9 8.4* 8.7* 8.9   GFR: Estimated Creatinine Clearance: 40.1 mL/min (by C-G formula based on SCr of 0.63 mg/dL). Recent Labs  Lab 04/09/19 1625 04/11/19 1538 04/13/19 0349 04/14/19 0307  WBC 9.4 9.3 7.7 6.9    Liver Function Tests: Recent Labs  Lab 04/09/19 1625 04/11/19 1538 04/12/19 0513 04/13/19 0349  AST 202* 211*  --  148*  ALT 133* 131*  --  102*  ALKPHOS  --  469*  --  379*  BILITOT 10.0* 9.5* 8.9* 8.2*  PROT 6.6 6.4*  --  5.7*  ALBUMIN  --  2.8*  --  2.4*   No results for input(s): LIPASE, AMYLASE in the last 168 hours. No results for input(s): AMMONIA in the last 168 hours.  ABG No results found for: PHART, PCO2ART, PO2ART, HCO3, TCO2, ACIDBASEDEF, O2SAT   Coagulation Profile: Recent Labs  Lab 04/09/19 1625 04/12/19 0800 04/13/19 0349  INR 1.2* 1.2 1.2    Cardiac Enzymes: No results for input(s): CKTOTAL, CKMB, CKMBINDEX, TROPONINI in the last 168 hours.  HbA1C: No results found for: HGBA1C  CBG: Recent Labs  Lab 04/12/19 0811 04/13/19 0654  GLUCAP 93 102*    Review of Systems:   Unable to be performed due to intubated/ sedated.  Past Medical History  She,  has a past medical history of Anxiety, CAD (coronary artery disease), CKD (chronic kidney disease), Gastro-esophageal reflux disease without esophagitis, Hypothyroidism, Ischemic cardiomyopathy, Jaundice (03/2019), Mild dilation of ascending aorta (H[facility]), Moderate protein-calorie malnutrition ([place]), Permanent atrial fibrillation ([place]), Severe episode of recurrent major depressive disorder, without psychotic features ([place]), and  Symptomatic bradycardia.   Surgical History    Past Surgical History:  Procedure Laterality Date   CESAREAN SECTION     COLONOSCOPY N/A 03/26/2019   Procedure: COLONOSCOPY;  Surgeon: Daneil Dolin, MD;  Location: [facility];  Service: Endoscopy;  Laterality: N/A;   CORONARY ARTERY BYPASS GRAFT     ESOPHAGOGASTRODUODENOSCOPY (EGD) WITH PROPOFOL N/A 03/26/2019   Procedure: ESOPHAGOGASTRODUODENOSCOPY (EGD) WITH PROPOFOL;  Surgeon: Daneil Dolin, MD;  Location: [facility];  Service: Endoscopy;  Laterality: N/A;   OTHER SURGICAL HISTORY     cesearan section    PACEMAKER GENERATOR CHANGE  06/15/2017   [facility] Scientific Accolade SR L 300 ([place] [id number]) implanted by Dr Ernestina Patches at [facility] in [place] for symptomatic bradycardia   PACEMAKER IMPLANT  11/16/2006   PPM implanted for bradycardia     Social History   reports that she has been smoking cigarettes. She has been smoking about 0.50 packs per day. She has never used smokeless tobacco. She reports that she does not drink alcohol or use drugs.   Family History   Her family history includes Diabetes in her  mother; Heart disease in her father and mother; Hypertension in her brother, father, and mother.   Allergies No Known Allergies   Home Medications  Prior to Admission medications   Medication Sig Start Date End Date Taking? Authorizing Provider  amoxicillin (AMOXIL) 500 MG capsule Take 500 mg by mouth 2 (two) times daily. For 14 days   Yes [provider]  aspirin EC 325 MG tablet Take 1 tablet (325 mg total) by mouth daily. 03/30/19  Yes Herminio Commons, MD  atorvastatin (LIPITOR) 10 MG tablet Take 10 mg by mouth daily.   Yes [provider]  benazepril (LOTENSIN) 5 MG tablet Take 2.5 mg by mouth daily.   Yes [provider]  Cholecalciferol (VITAMIN D3) 1000 units CAPS Take 1,000 Units by mouth daily.   Yes [provider]  clarithromycin  (BIAXIN) 500 MG tablet Take 500 mg by mouth 2 (two) times daily. For 14 days   Yes [provider]  diltiazem (CARTIA XT) 240 MG 24 hr capsule Take 240 mg by mouth daily.   Yes [provider]  DULoxetine (CYMBALTA) 30 MG capsule Take 1 capsule by mouth daily. 03/01/19  Yes [provider]  FERREX 150 150 MG capsule Take 150 mg by mouth daily.  02/07/18  Yes [provider]  gabapentin (NEURONTIN) 300 MG capsule Take 300 mg by mouth at bedtime. For pain/sleep 01/18/19  Yes [provider]  lansoprazole (PREVACID) 30 MG capsule Take 30 mg by mouth 2 (two) times daily. 14 day course.   Yes [provider]  levothyroxine (SYNTHROID, LEVOTHROID) 50 MCG tablet Take 50 mcg by mouth daily before breakfast.   Yes [provider]  metoprolol succinate (TOPROL-XL) 50 MG 24 hr tablet Take 50 mg by mouth daily. Take with or immediately following a meal.   Yes [provider]  metoprolol tartrate (LOPRESSOR) 100 MG tablet Take 1 tablet (100 mg total) by mouth 2 (two) times daily. 03/27/19 04/26/19 Yes Shah, Pratik D, DO  nitroGLYCERIN (NITROSTAT) 0.4 MG SL tablet Place 1 tablet (0.4 mg total) under the tongue every 5 (five) minutes as needed for chest pain. 09/18/18  Yes Herminio Commons, MD  pantoprazole (PROTONIX) 40 MG tablet Take 1 tablet (40 mg total) by mouth daily. 03/27/19 03/26/20 Yes Shah, Pratik D, DO  vitamin C (ASCORBIC ACID) 500 MG tablet Take 500 mg by mouth 2 (two) times daily.    Yes [provider]     Critical care time: [address] DO 04/14/19 4:39 PM [facility]

## 2019-04-14 NOTE — Procedures (Signed)
Extubation Procedure Note  Patient Details:   Name: Angelica Hernandez DOB: Jul 08, 1933 MRN: [id number]   Airway Documentation:    Vent end date: 04/14/19 Vent end time: 1803   Evaluation  O2 sats: stable throughout Complications: No apparent complications Patient did tolerate procedure well. Bilateral Breath Sounds: Clear, Diminished   Yes   RT extubated patient to 2L [place] per MD order with RN at bedside. Positive cuff leak noted. Patient tolerated well and can speak. No stridor noted. Patient says breathing is comfortable and sating 100%. RT will continue to monitor as needed.   Vernona Rieger 04/14/2019, 6:54 PM

## 2019-04-14 NOTE — Transfer of Care (Signed)
Immediate Anesthesia Transfer of Care Note  Patient: Enid Derry Mcleary  Procedure(s) Performed: ENDOSCOPIC RETROGRADE CHOLANGIOPANCREATOGRAPHY (ERCP) (N/A ) UPPER ENDOSCOPIC ULTRASOUND (EUS) LINEAR (N/A ) BIOPSY ESOPHAGOGASTRODUODENOSCOPY (EGD) WITH PROPOFOL (N/A ) SPHINCTEROTOMY PANCREATIC STENT PLACEMENT REMOVAL OF STONES BILIARY DILATION BILIARY STENT PLACEMENT HEMOSTASIS CLIP PLACEMENT  Patient Location: ICU  Anesthesia Type:General  Level of Consciousness: Patient remains intubated per anesthesia plan  Airway & Oxygen Therapy: Patient remains intubated per anesthesia plan and Patient placed on Ventilator (see vital sign flow sheet for setting)  Post-op Assessment: Report given to RN and Post -op Vital signs reviewed and stable  Post vital signs: Reviewed and stable  Last Vitals:  Vitals Value Taken Time  BP    Temp    Pulse 141 04/14/19 1247  Resp 23 04/14/19 1247  SpO2 98 % 04/14/19 1247  Vitals shown include unvalidated device data.  Last Pain:  Vitals:   04/14/19 0837  TempSrc: Oral  PainSc: 0-No pain         Complications: No apparent anesthesia complications

## 2019-04-14 NOTE — Progress Notes (Signed)
Pt's daughter Clarise Cruz notified of pt extubated and updated.

## 2019-04-14 NOTE — Op Note (Signed)
[facility] Patient Name: Angelica Hernandez Procedure Date : 04/14/2019 MRN: [id number] Attending MD: Justice Britain , MD Date of Birth: 25-Mar-1934 CSN: [id number] Age: 85 Admit Type: Inpatient Procedure:                Upper EUS Indications:              Common bile duct dilation (etiology unknown) seen                            on CT scan, Abnormal liver function test, Suspected                            choledocholithiasis Providers:                Justice Britain, MD, Josie Dixon, RN, Carlyn Reichert, RN, Lazaro Arms, Technician, William Dalton, Technician Referring MD:             [facility] Medicines:                General Anesthesia Complications:            No immediate complications. Estimated Blood Loss:     Estimated blood loss was minimal. Procedure:                Pre-Anesthesia Assessment:                           - Prior to the procedure, a History and Physical                            was performed, and patient medications and                            allergies were reviewed. The patient's tolerance of                            previous anesthesia was also reviewed. The risks                            and benefits of the procedure and the sedation                            options and risks were discussed with the patient.                            All questions were answered, and informed consent                            was obtained. Prior Anticoagulants: The patient has                            taken Coumadin (  warfarin), last dose was 4 days                            prior to procedure. ASA Grade Assessment: III - A                            patient with severe systemic disease. After                            reviewing the risks and benefits, the patient was                            deemed in satisfactory condition to undergo the   procedure.                           After obtaining informed consent, the endoscope was                            passed under direct vision. Throughout the                            procedure, the patient's blood pressure, pulse, and                            oxygen saturations were monitored continuously. The                            [id number] Olympus gastroscope was                            introduced through the mouth, and advanced to the                            second part of duodenum. The [id number]                            Olympus duodenoscope was introduced through the                            mouth, and advanced to the second part of duodenum.                            The [id number]) Olympus Linear EUS scope                            was introduced through the mouth, and advanced to                            the duodenum for ultrasound examination from the                            stomach and duodenum. The upper EUS was  accomplished without difficulty. The patient                            tolerated the procedure. Scope In: Scope Out: Findings:      ENDOSCOPIC FINDING: :      No gross lesions were noted in the entire esophagus.      A 5 cm hiatal hernia was present.      Patchy mildly erythematous mucosa without bleeding was found in the       gastric body and in the gastric antrum.      No gross lesions were noted in the entire examined stomach. Biopsies       were taken with a cold forceps for histology and Helicobacter pylori       testing.      One non-bleeding cratered duodenal ulcer with a clean ulcer base       (Forrest Class III) was found in the duodenal bulb. The lesion was 8 mm       in largest dimension.      No other gross lesions were noted in the first portion of the duodenum       and in the second portion of the duodenum.      A prominent/floppy ampulla was found.      ENDOSONOGRAPHIC  FINDING: :      There was dilation in the common bile duct and in the common hepatic       duct which measured up to 12 mm.      Moderate hyperechoic material consistent with sludge and stones       (non-shadowing) were visualized endosonographically in the common bile       duct.      Extensive hyperechoic material consistent with sludge was visualized       endosonographically in the gallbladder - the gallbladder was       significantly dilated.      Pancreatic parenchymal abnormalities were noted in the pancreatic head,       genu of the pancreas, pancreatic body and pancreatic tail. These       consisted of atrophy and hyperechoic strands.      The pancreatic duct had a dilated endosonographic appearance in the main       pancreatic duct. The pancreatic duct in the head measured 3.4 mm. The       pancreatic duct in the neck measured 3.7 mm. The pancreatic duct in the       body measured 3.1 mm. The pancreatic duct in the tail measured 1.5 mm.      Endosonographic imaging of the ampulla showed no intramural       (subepithelial) lesion or wall thickening.      Endosonographic imaging in the visualized portion of the liver showed no       mass.      No malignant-appearing lymph nodes were visualized in the celiac region       (level 20), peripancreatic region and porta hepatis region.      The celiac region was visualized. Impression:               EGD Impression:                           - No gross mucosal lesions in esophagus. 5 cm  hiatal hernia.                           - Erythematous mucosa in the gastric body and                            antrum. Biopsied.                           - Non-bleeding duodenal ulcer with a clean ulcer                            base (Forrest Class III). No other gross lesions in                            the first portion of the duodenum and in the second                            portion of the duodenum.                            - Ampulla was prominent and floppy.                           EUS Impression:                           - There was dilation in the common bile duct and in                            the common hepatic duct which measured up to 12 mm.                           - Hyperechoic material consistent with sludge was                            visualized endosonographically in the common bile                            duct.                           - Hyperechoic material consistent with sludge was                            visualized endosonographically in the gallbladder                            as well as significant dilation.                           - Pancreatic parenchymal abnormalities consisting                            of atrophy and hyperechoic strands were noted in  the pancreatic head, genu of the pancreas,                            pancreatic body and pancreatic tail.                           - The pancreatic duct had a dilated endosonographic                            appearance in the main pancreatic duct.                           - No malignant-appearing lymph nodes were                            visualized in the celiac region (level 20),                            peripancreatic region and porta hepatis region. Recommendation:           - Proceed to scheduled ERCP.                           - Await path results.                           - Increase PPI to BID.                           - The findings and recommendations were discussed                            with the patient's family.                           - The findings and recommendations were discussed                            with the referring physician. Procedure Code(s):        --- Professional ---                           [phone], Esophagogastroduodenoscopy, flexible,                            transoral; with endoscopic ultrasound examination                             limited to the esophagus, stomach or duodenum, and                            adjacent structures                           43239, Esophagogastroduodenoscopy, flexible,  transoral; with biopsy, single or multiple Diagnosis Code(s):        --- Professional ---                           K44.9, Diaphragmatic hernia without obstruction or                            gangrene                           K31.89, Other diseases of stomach and duodenum                           K26.9, Duodenal ulcer, unspecified as acute or                            chronic, without hemorrhage or perforation                           K83.8, Other specified diseases of biliary tract                           I89.9, Noninfective disorder of lymphatic vessels                            and lymph nodes, unspecified                           K86.9, Disease of pancreas, unspecified                           R94.5, Abnormal results of liver function studies                           R93.3, Abnormal findings on diagnostic imaging of                            other parts of digestive tract                           R93.2, Abnormal findings on diagnostic imaging of                            liver and biliary tract CPT copyright 2019 [employer]. All rights reserved. The codes documented in this report are preliminary and upon coder review may  be revised to meet current compliance requirements. Justice Britain, MD 04/14/2019 1:37:59 PM Number of Addenda: 0

## 2019-04-15 ENCOUNTER — Inpatient Hospital Stay (HOSPITAL_COMMUNITY): Payer: Medicare Other

## 2019-04-15 LAB — CBC WITH DIFFERENTIAL/PLATELET
Abs Immature Granulocytes: 0.04 10*3/uL (ref 0.00–0.07)
Basophils Absolute: 0 10*3/uL (ref 0.0–0.1)
Basophils Relative: 0 %
Eosinophils Absolute: 0 10*3/uL (ref 0.0–0.5)
Eosinophils Relative: 0 %
HCT: 35.6 % — ABNORMAL LOW (ref 36.0–46.0)
Hemoglobin: 11.6 g/dL — ABNORMAL LOW (ref 12.0–15.0)
Immature Granulocytes: 0 %
Lymphocytes Relative: 10 %
Lymphs Abs: 1.2 10*3/uL (ref 0.7–4.0)
MCH: 29.1 pg (ref 26.0–34.0)
MCHC: 32.6 g/dL (ref 30.0–36.0)
MCV: 89.4 fL (ref 80.0–100.0)
Monocytes Absolute: 0.5 10*3/uL (ref 0.1–1.0)
Monocytes Relative: 4 %
Neutro Abs: 9.6 10*3/uL — ABNORMAL HIGH (ref 1.7–7.7)
Neutrophils Relative %: 86 %
Platelets: 358 10*3/uL (ref 150–400)
RBC: 3.98 MIL/uL (ref 3.87–5.11)
RDW: 15.5 % (ref 11.5–15.5)
WBC: 11.2 10*3/uL — ABNORMAL HIGH (ref 4.0–10.5)
nRBC: 0 % (ref 0.0–0.2)

## 2019-04-15 LAB — GLUCOSE, CAPILLARY: Glucose-Capillary: 116 mg/dL — ABNORMAL HIGH (ref 70–99)

## 2019-04-15 LAB — COMPREHENSIVE METABOLIC PANEL
ALT: 103 U/L — ABNORMAL HIGH (ref 0–44)
AST: 132 U/L — ABNORMAL HIGH (ref 15–41)
Albumin: 2.8 g/dL — ABNORMAL LOW (ref 3.5–5.0)
Alkaline Phosphatase: 359 U/L — ABNORMAL HIGH (ref 38–126)
Anion gap: 13 (ref 5–15)
BUN: 11 mg/dL (ref 8–23)
CO2: 22 mmol/L (ref 22–32)
Calcium: 9 mg/dL (ref 8.9–10.3)
Chloride: 100 mmol/L (ref 98–111)
Creatinine, Ser: 0.7 mg/dL (ref 0.44–1.00)
GFR calc Af Amer: >60 mL/min (ref >60)
GFR calc non Af Amer: >60 mL/min (ref >60)
Glucose, Bld: 128 mg/dL — ABNORMAL HIGH (ref 70–99)
Potassium: 4.6 mmol/L (ref 3.5–5.1)
Sodium: 135 mmol/L (ref 135–145)
Total Bilirubin: 6.8 mg/dL — ABNORMAL HIGH (ref 0.3–1.2)
Total Protein: 6.3 g/dL — ABNORMAL LOW (ref 6.5–8.1)

## 2019-04-15 LAB — LIPASE, BLOOD: Lipase: 27 U/L (ref 11–51)

## 2019-04-15 LAB — LACTIC ACID, PLASMA: Lactic Acid, Venous: 1.3 mmol/L (ref 0.5–1.9)

## 2019-04-15 MED ORDER — LACTATED RINGERS IV SOLN
INTRAVENOUS | Status: DC
  Administered 2019-04-15 – 2019-04-16 (×3): via INTRAVENOUS

## 2019-04-15 MED ORDER — PANTOPRAZOLE SODIUM 40 MG PO TBEC
40.0000 mg | DELAYED_RELEASE_TABLET | Freq: Two times a day (BID) | ORAL | Status: DC
  Administered 2019-04-15 – 2019-04-18 (×7): 40 mg via ORAL
  Filled 2019-04-15 (×8): qty 1

## 2019-04-15 NOTE — Progress Notes (Signed)
NAME:  Angelica Hernandez, MRN:  [id number], DOB:  May 12, 1934, LOS: 3 ADMISSION DATE:  04/11/2019, CONSULTATION DATE:  04/15/19 REFERRING MD:  Allison Quarry, MD, CHIEF COMPLAINT:  intubated  Brief History   Possible bile duct perforation with possible extravasation of biliary fluid  History of present illness   Angelica Hernandez is an 85 year old woman admitted on 10/29 with painless jaundice x 1 day.  There had been plans for an outpatient endoscopy to evaluate transaminase and bilirubin elevations incidentally noted during her recent hospitalization.  She was sent to the hospital due to worsening LFTs. At admission she had a dilated common bile duct without a visible stone. She underwent ERCP 10/31, came to the ICU sedated and mechanically ventilated  Past Medical History  Permanent A. Fib, pacemaker in place CKD CAD HFrEF due to ischemic cardiomyopathy GERD Tobacco abuse  Significant Hospital Events   ERCP   Consults:  GI General surgery PCCM  Procedures:  ERCP- sludge in distal CBD; likely rupture during dilation. Subsequent stent placement to traverse the area.   Significant Diagnostic Tests:  ERCP CT abd/ pelvis- no bile duct leak; CBD stent, hepatic steatosis  Micro Data:  sars co-v2 negative  Antimicrobials:  Amp sulbactam 10/31 Pip-tazo 10/31>>  Interim history/subjective:  Extubated, on room air. Denies SOB/CP/N/V/pain. Required diltiazem for Afib with RVR- achieved adequate rate control  Objective   Blood pressure 124/81, pulse 74, temperature 97.7 F (36.5 C), temperature source Oral, resp. rate 16, height 5\' 6"  (1.676 m), weight 46.9 kg, SpO2 100 %.    Vent Mode: CPAP;PSV FiO2 (%):  [30 %-50 %] 30 % Set Rate:  [15 bmp] 15 bmp Vt Set:  [450 mL-470 mL] 470 mL PEEP:  [5 cmH20] 5 cmH20 Pressure Support:  [8 cmH20] 8 cmH20 Plateau Pressure:  [12 cmH20-13 cmH20] 13 cmH20   Intake/Output Summary (Last 24 hours) at 04/15/2019 0953 Last data filed at 04/15/2019 0600  Gross per 24 hour  Intake 1141.74 ml  Output 1475 ml  Net -333.26 ml   Filed Weights   04/14/19 0459 04/14/19 0837 04/15/19 0500  Weight: 46.9 kg 46.9 kg 46.9 kg    Examination: General: frail-appearing elderly woman laying in bed HENT: Lupus/AT, eyes anicteric, mucous membranes moist Lungs: CTAB, Cardiovascular: irreg irreg rhythm,rate 90s-.low 100s. Pacemaker protruding from left chest. Abdomen: soft, NT, ND Extremities: no c/c/e Derm: pallor, no rashes Neuro: RASS -5 GU: foley in place  Resolved Hospital Problem list     Assessment & Plan:  Cholestasis due to biliary obstruction in CBD, s/p ERCP complicated by perforation during dilation -GI following -con't pip-tazo -rectal indomethacin for pancreatitis prophylaxis  Acute vent-dependent hypoxic respiratory failure  -extubated w/o sequelae.   Chronic Afib; PPM in place. On Bblocker and CCB for rate control, PTA. -diltiazem for rate control -holding AC due to potential risk of needing procedures  Hypokalemia -repleted  Chronic normocytic anemia -con't to monitor; no current indication for transfusion  H/o GERD -con't IV PPI; on prevacid PTA  Hypothyroidism; TSH 1.5 -con't PTA synthroid   Will sign off for now, but remain available if needed Best practice:  Diet: NPO Pain/Anxiety/Delirium protocol (if indicated): n/a VAP protocol (if indicated): n/a DVT prophylaxis: per primary team GI prophylaxis: PPI Glucose control: n/a Mobility: bedrest Code Status: full Family Communication: family updated by Dr. Rush Landmark Disposition: ICU  Labs   CBC: Recent Labs  Lab 04/09/19 1625 04/11/19 1538 04/13/19 0349 04/14/19 0307 04/14/19 1430 04/15/19 0526  WBC 9.4 9.3 7.7 6.9  --  11.2*  NEUTROABS 6,900  --  4.9 3.9  --  9.6*  HGB 13.1 12.3 10.6* 10.3* 11.9* 11.6*  HCT 39.8 37.9 31.2* 30.3* 35.0* 35.6*  MCV 87.9 90.2 86.4 85.6  --  89.4  PLT 454* 460* 367 326  --  123456    Basic Metabolic Panel: Recent  Labs  Lab 04/11/19 1538 04/13/19 0349 04/13/19 1625 04/14/19 0307 04/14/19 1430 04/15/19 0526  NA 138 138 136 138 132* 135  K 3.3* 2.4* 3.5 3.4* 3.9 4.6  CL 98 100 100 101  --  100  CO2 26 26 23 24   --  22  GLUCOSE 134* 120* 147* 123*  --  128*  BUN 10 <5* 7* 8  --  11  CREATININE 0.80 0.58 0.66 0.63  --  0.70  CALCIUM 8.9 8.4* 8.7* 8.9  --  9.0   GFR: Estimated Creatinine Clearance: 40.1 mL/min (by C-G formula based on SCr of 0.7 mg/dL). Recent Labs  Lab 04/11/19 1538 04/13/19 0349 04/14/19 0307 04/15/19 0526  WBC 9.3 7.7 6.9 11.2*  LATICACIDVEN  --   --   --  1.3    Liver Function Tests: Recent Labs  Lab 04/09/19 1625 04/11/19 1538 04/12/19 0513 04/13/19 0349 04/15/19 0526  AST 202* 211*  --  148* 132*  ALT 133* 131*  --  102* 103*  ALKPHOS  --  469*  --  379* 359*  BILITOT 10.0* 9.5* 8.9* 8.2* 6.8*  PROT 6.6 6.4*  --  5.7* 6.3*  ALBUMIN  --  2.8*  --  2.4* 2.8*   Recent Labs  Lab 04/15/19 0526  LIPASE 27   No results for input(s): AMMONIA in the last 168 hours.  ABG    Component Value Date/Time   PHART 7.455 (H) 04/14/2019 1430   PCO2ART 33.0 04/14/2019 1430   PO2ART 221.0 (H) 04/14/2019 1430   HCO3 23.3 04/14/2019 1430   TCO2 24 04/14/2019 1430   O2SAT 100.0 04/14/2019 1430     Coagulation Profile: Recent Labs  Lab 04/09/19 1625 04/12/19 0800 04/13/19 0349  INR 1.2* 1.2 1.2    Cardiac Enzymes: No results for input(s): CKTOTAL, CKMB, CKMBINDEX, TROPONINI in the last 168 hours.  HbA1C: No results found for: HGBA1C  CBG: Recent Labs  Lab 04/12/19 0811 04/13/19 0654 04/15/19 0809  GLUCAP 93 102* 116*    Review of Systems:   Unable to be performed due to intubated/ sedated.  Past Medical History  She,  has a past medical history of Anxiety, CAD (coronary artery disease), CKD (chronic kidney disease), Gastro-esophageal reflux disease without esophagitis, Hypothyroidism, Ischemic cardiomyopathy, Jaundice (03/2019), Mild dilation  of ascending aorta (HCC), Moderate protein-calorie malnutrition ([place]), Permanent atrial fibrillation ([place]), Severe episode of recurrent major depressive disorder, without psychotic features ([place]), and Symptomatic bradycardia.   Surgical History    Past Surgical History:  Procedure Laterality Date  . CESAREAN SECTION    . COLONOSCOPY N/A 03/26/2019   Procedure: COLONOSCOPY;  Surgeon: Daneil Dolin, MD;  [facility];  Service: Endoscopy;  Laterality: N/A;  . CORONARY ARTERY BYPASS GRAFT    . ESOPHAGOGASTRODUODENOSCOPY (EGD) WITH PROPOFOL N/A 03/26/2019   Procedure: ESOPHAGOGASTRODUODENOSCOPY (EGD) WITH PROPOFOL;  Surgeon: Daneil Dolin, MD;  Location[facility];  Service: Endoscopy;  Laterality: N/A;  . OTHER SURGICAL HISTORY     cesearan section   . PACEMAKER GENERATOR CHANGE  06/15/2017   [employer] Accolade SR L 300 ([place] [id number]) implanted by Dr Ernestina Patches at  [facility] in [place] for symptomatic bradycardia  . PACEMAKER IMPLANT  11/16/2006   PPM implanted for bradycardia     Social History   reports that she has been smoking cigarettes. She has been smoking about 0.50 packs per day. She has never used smokeless tobacco. She reports that she does not drink alcohol or use drugs.   Family History   Her family history includes Diabetes in her mother; Heart disease in her father and mother; Hypertension in her brother, father, and mother.   Allergies No Known Allergies   Home Medications  Prior to Admission medications   Medication Sig Start Date End Date Taking? Authorizing Provider  amoxicillin (AMOXIL) 500 MG capsule Take 500 mg by mouth 2 (two) times daily. For 14 days   Yes [provider]  aspirin EC 325 MG tablet Take 1 tablet (325 mg total) by mouth daily. 03/30/19  Yes Herminio Commons, MD  atorvastatin (LIPITOR) 10 MG tablet Take 10 mg by mouth daily.   Yes [provider]  benazepril (LOTENSIN) 5 MG  tablet Take 2.5 mg by mouth daily.   Yes [provider]  Cholecalciferol (VITAMIN D3) 1000 units CAPS Take 1,000 Units by mouth daily.   Yes [provider]  clarithromycin (BIAXIN) 500 MG tablet Take 500 mg by mouth 2 (two) times daily. For 14 days   Yes [provider]  diltiazem (CARTIA XT) 240 MG 24 hr capsule Take 240 mg by mouth daily.   Yes [provider]  DULoxetine (CYMBALTA) 30 MG capsule Take 1 capsule by mouth daily. 03/01/19  Yes [provider]  FERREX 150 150 MG capsule Take 150 mg by mouth daily.  02/07/18  Yes [provider]  gabapentin (NEURONTIN) 300 MG capsule Take 300 mg by mouth at bedtime. For pain/sleep 01/18/19  Yes [provider]  lansoprazole (PREVACID) 30 MG capsule Take 30 mg by mouth 2 (two) times daily. 14 day course.   Yes [provider]  levothyroxine (SYNTHROID, LEVOTHROID) 50 MCG tablet Take 50 mcg by mouth daily before breakfast.   Yes [provider]  metoprolol succinate (TOPROL-XL) 50 MG 24 hr tablet Take 50 mg by mouth daily. Take with or immediately following a meal.   Yes [provider]  metoprolol tartrate (LOPRESSOR) 100 MG tablet Take 1 tablet (100 mg total) by mouth 2 (two) times daily. 03/27/19 04/26/19 Yes Shah, Pratik D, DO  nitroGLYCERIN (NITROSTAT) 0.4 MG SL tablet Place 1 tablet (0.4 mg total) under the tongue every 5 (five) minutes as needed for chest pain. 09/18/18  Yes Herminio Commons, MD  pantoprazole (PROTONIX) 40 MG tablet Take 1 tablet (40 mg total) by mouth daily. 03/27/19 03/26/20 Yes Shah, Pratik D, DO  vitamin C (ASCORBIC ACID) 500 MG tablet Take 500 mg by mouth 2 (two) times daily.    Yes [provider]     Critical care time: I have independently seen and examined the patient, reviewed data, and developed an assessment and plan. A total of 32 minutes were spent in critical care assessment and medical decision making. This critical  care time does not reflect procedure time, or teaching time or supervisory time of PA/NP/Med student/Med Resident, etc but could involve care discussion time. Agree with the documentation above.   Bonna Gains, MD PhD 04/15/19 10:00 AM     Will sign off for now, but remain available if needed  Bonna Gains, DO 04/15/19 9:53  AM     

## 2019-04-15 NOTE — Progress Notes (Signed)
PROGRESS NOTE    Angelica Hernandez  [id number] DOB: 10/16/1933 DOA: 04/11/2019 PCP: Caryl Bis, MD    Brief Narrative:  Patient with multiple chronic issues including coronary artery disease, CABG, symptomatic bradycardia with pacemaker, chronic systolic heart failure recent hospitalization due to GI bleed, was found with slightly elevated bilirubin and dilated common bile duct.  Plan were for outpatient EUS, repeat bilirubin were elevated and was brought to emergency room.  She had generalized weakness and yellowness.  No other particular features.  Patient was in the hospital, developed A. fib with RVR.  Underwent EGD, ERCP on 04/14/2019 where she was found to have perforation of CBD and duodenum, treated with biliary stent with good results.  Subsequent CT scan did not show any free air.  Was admitted to ICU for monitoring.  Clinically improving today.   Assessment & Plan:   Principal Problem:   Painless jaundice Active Problems:   Atrial fibrillation (HCC)   CAD (coronary artery disease)   Hypothyroidism   DCM (dilated cardiomyopathy) (HCC)  Biliary ductal agitation/choledocholithiasis with obstructive jaundice: Status post ERCP and biliary stenting.  Clinically improving.  Continue to monitor lipase, liver function test.  Biopsies pending.  Clinical examination not consistent with cancer as per reports.  With perforation with concern of bile leak, contained duodenal perforation, underwent total stenting with good results.  Clinically without evidence of peritonitis.  CT scans and repeat KUBs with no pneumoperitoneum.  Seen by surgery.  Conservative management. Continue n.p.o. today.  Allow meds with sips.  Adequate IV fluids.  PPI IV twice daily.  IV Zosyn for prophylaxis.  Close monitoring with serial abdominal exam.  Coronary artery disease: Stable.  Aspirin on hold.  On beta-blockers.  Hypothyroidism: Stable.  On Synthroid.  Ischemic cardiomyopathy: Stable.   Euvolemic.  A. fib with RVR: Developed rapid A. fib, needed Cardizem infusion currently weaned off.  Heart rate remains slightly elevated, seen by cardiology.  Started on every 6 hours oral metoprolol.  Accepting high normal heart rate with risk of hypotension.  Her anticoagulation is discontinued due to concern about GI bleed.  Patient is adequately stabilized.  She can be transferred to cardiac telemetry to continue further treatment.   DVT prophylaxis: SCDs Code Status: Full code Family Communication: None Disposition Plan: Cardiac telemetry   Consultants:   Cardiology  Surgery  Gastroenterology  Procedures:   ERCP, biliary stenting, 04/14/2019  Antimicrobials:   Unasyn, 1 dose 04/14/2019  Zosyn, 04/14/2019-----   Subjective: Patient seen and examined.  No overnight events.  Heart rate fluctuates but mostly 100-110.  Patient herself has no complaints.  She is worried about jaundice otherwise no other symptoms.  Denies any abdominal pain nausea vomiting.  No bowel movement yet after procedure.  Objective: Vitals:   04/15/19 0600 04/15/19 0700 04/15/19 0800 04/15/19 0812  BP: 115/76 (!) 142/91 124/81 124/81  Pulse: 83 (!) 116 89 74  Resp: 12 17 14 16   Temp:    97.7 F (36.5 C)  TempSrc:    Oral  SpO2: 99% 99% 99% 100%  Weight:      Height:        Intake/Output Summary (Last 24 hours) at 04/15/2019 0936 Last data filed at 04/15/2019 0600 Gross per 24 hour  Intake 1141.74 ml  Output 1475 ml  Net -333.26 ml   Filed Weights   04/14/19 0459 04/14/19 0837 04/15/19 0500  Weight: 46.9 kg 46.9 kg 46.9 kg    Examination:  General exam: Appears calm and  comfortable, on room air.  Icteric. Respiratory system: Clear to auscultation. Respiratory effort normal.  No added sounds. Cardiovascular system: S1 & S2 heard, irregularly irregular.  Pacemaker left precordium.  No JVD, murmurs, rubs, gallops or clicks. No pedal edema. Gastrointestinal system: Abdomen is  nondistended, soft and nontender. No organomegaly or masses felt. Normal bowel sounds heard.  No rigidity or guarding. Central nervous system: Alert and oriented. No focal neurological deficits. Extremities: Symmetric 5 x 5 power. Skin: No rashes, lesions or ulcers Psychiatry: Judgement and insight appear normal. Mood & affect appropriate.     Data Reviewed: I have personally reviewed following labs and imaging studies  CBC: Recent Labs  Lab 04/09/19 1625 04/11/19 1538 04/13/19 0349 04/14/19 0307 04/14/19 1430 04/15/19 0526  WBC 9.4 9.3 7.7 6.9  --  11.2*  NEUTROABS 6,900  --  4.9 3.9  --  9.6*  HGB 13.1 12.3 10.6* 10.3* 11.9* 11.6*  HCT 39.8 37.9 31.2* 30.3* 35.0* 35.6*  MCV 87.9 90.2 86.4 85.6  --  89.4  PLT 454* 460* 367 326  --  123456   Basic Metabolic Panel: Recent Labs  Lab 04/11/19 1538 04/13/19 0349 04/13/19 1625 04/14/19 0307 04/14/19 1430 04/15/19 0526  NA 138 138 136 138 132* 135  K 3.3* 2.4* 3.5 3.4* 3.9 4.6  CL 98 100 100 101  --  100  CO2 26 26 23 24   --  22  GLUCOSE 134* 120* 147* 123*  --  128*  BUN 10 <5* 7* 8  --  11  CREATININE 0.80 0.58 0.66 0.63  --  0.70  CALCIUM 8.9 8.4* 8.7* 8.9  --  9.0   GFR: Estimated Creatinine Clearance: 40.1 mL/min (by C-G formula based on SCr of 0.7 mg/dL). Liver Function Tests: Recent Labs  Lab 04/09/19 1625 04/11/19 1538 04/12/19 0513 04/13/19 0349 04/15/19 0526  AST 202* 211*  --  148* 132*  ALT 133* 131*  --  102* 103*  ALKPHOS  --  469*  --  379* 359*  BILITOT 10.0* 9.5* 8.9* 8.2* 6.8*  PROT 6.6 6.4*  --  5.7* 6.3*  ALBUMIN  --  2.8*  --  2.4* 2.8*   Recent Labs  Lab 04/15/19 0526  LIPASE 27   No results for input(s): AMMONIA in the last 168 hours. Coagulation Profile: Recent Labs  Lab 04/09/19 1625 04/12/19 0800 04/13/19 0349  INR 1.2* 1.2 1.2   Cardiac Enzymes: No results for input(s): CKTOTAL, CKMB, CKMBINDEX, TROPONINI in the last 168 hours. BNP (last 3 results) No results for  input(s): PROBNP in the last 8760 hours. HbA1C: No results for input(s): HGBA1C in the last 72 hours. CBG: Recent Labs  Lab 04/12/19 0811 04/13/19 0654 04/15/19 0809  GLUCAP 93 102* 116*   Lipid Profile: No results for input(s): CHOL, HDL, LDLCALC, TRIG, CHOLHDL, LDLDIRECT in the last 72 hours. Thyroid Function Tests: No results for input(s): TSH, T4TOTAL, FREET4, T3FREE, THYROIDAB in the last 72 hours. Anemia Panel: No results for input(s): VITAMINB12, FOLATE, FERRITIN, TIBC, IRON, RETICCTPCT in the last 72 hours. Sepsis Labs: Recent Labs  Lab 04/15/19 0526  LATICACIDVEN 1.3    Recent Results (from the past 240 hour(s))  SARS CORONAVIRUS 2 (TAT 6-24 HRS) Nasopharyngeal Nasopharyngeal Swab     Status: None   Collection Time: 04/12/19  5:41 AM   Specimen: Nasopharyngeal Swab  Result Value Ref Range Status   SARS Coronavirus 2 NEGATIVE NEGATIVE Final    Comment: (NOTE) SARS-CoV-2 target nucleic acids are  NOT DETECTED. The SARS-CoV-2 RNA is generally detectable in upper and lower respiratory specimens during the acute phase of infection. Negative results do not preclude SARS-CoV-2 infection, do not rule out co-infections with other pathogens, and should not be used as the sole basis for treatment or other patient management decisions. Negative results must be combined with clinical observations, patient history, and epidemiological information. The expected result is Negative. Fact Sheet for Patients: SugarRoll.be Fact Sheet for Healthcare Providers: https://www.woods-mathews.com/ This test is not yet approved or cleared by the [place] FDA and  has been authorized for detection and/or diagnosis of SARS-CoV-2 by FDA under an Emergency Use Authorization (EUA). This EUA will remain  in effect (meaning this test can be used) for the duration of the COVID-19 declaration under Section 56 4(b)(1) of the Act, 21 U.S.C. section  360bbb-3(b)(1), unless the authorization is terminated or revoked sooner. Performed at [facility], [place] [address] [place], [place] [address]          Radiology Studies: Dg Abd 1 View  Result Date: 04/14/2019 CLINICAL DATA:  NG tube placement EXAM: ABDOMEN - 1 VIEW COMPARISON:  04/14/2019 FINDINGS: The enteric tube projects over the gastric body. The bowel gas pattern is nonspecific and nonobstructive. A biliary stent is noted. IMPRESSION: Enteric tube projects over the gastric body. Electronically Signed   By: Constance Holster M.D.   On: 04/14/2019 19:07   Dg Abd 1 View  Result Date: 04/14/2019 CLINICAL DATA:  Evaluate OG tube EXAM: ABDOMEN - 1 VIEW COMPARISON:  None. FINDINGS: The OG tube terminates within the left upper quadrant, within the stomach. IMPRESSION: The OG tube terminates within the left upper quadrant, within the stomach. Electronically Signed   By: Dorise Bullion III M.D   On: 04/14/2019 13:33   Ct Abdomen Pelvis W Contrast  Result Date: 04/14/2019 CLINICAL DATA:  Perforation of the bile duct during ERCP with concern for retroperitoneal leak prior to placement a fully covered stents. No leak discovered post stent placement. EXAM: CT ABDOMEN AND PELVIS WITH CONTRAST TECHNIQUE: Multidetector CT imaging of the abdomen and pelvis was performed using the standard protocol following bolus administration of intravenous contrast. CONTRAST:  157mL OMNIPAQUE IOHEXOL 300 MG/ML  SOLN COMPARISON:  March 23, 2019 FINDINGS: Lower chest: No acute abnormality. Hepatobiliary: Pneumobilia is consistent with history of recent ERCP and stent placement. There is a stent in the common bile duct extending to the level of the duodenum. High attenuation material in the gallbladder is likely contrast. This is new since March 23, 2019. The common bile duct is patent. Hepatic steatosis is identified. No focal liver mass is noted. Pancreas: The pancreatic head is partially obscured by the  common bile duct stent. No pancreatic masses noted. Spleen: Normal in size without focal abnormality. Adrenals/Urinary Tract: There is a cyst in the right kidney. The adrenal glands, kidneys, ureters, and bladder are otherwise unremarkable. There is a Foley catheter in the bladder which is not completely compressed. Air in the bladder is likely from the Foley catheter. Stomach/Bowel: An NG tube terminates in the stomach. The stomach and small bowel are normal. The colon is normal. The appendix is not seen but there is no secondary evidence of appendicitis. Vascular/Lymphatic: Atherosclerotic changes are seen in the nonaneurysmal aorta. No adenopathy. Reproductive: Status post hysterectomy. No adnexal masses. Other: No free fluid is seen in the pelvis.  No free air. Musculoskeletal: No acute or significant osseous findings. IMPRESSION: 1. No free fluid in the abdomen  or pelvis to suggest bile leak. 2. Common bile duct stent as above. 3. Hepatic steatosis. 4. Atherosclerotic changes in the abdominal aorta. Electronically Signed   By: Dorise Bullion III M.D   On: 04/14/2019 15:40   Dg Chest Port 1 View  Result Date: 04/14/2019 CLINICAL DATA:  Evaluate ETT EXAM: PORTABLE CHEST 1 VIEW COMPARISON:  March 22, 2019 FINDINGS: No pneumothorax. An ET tube is been placed, terminating in the mid trachea, in good position. The NG tube terminates below today's film. Since the cardiomediastinal silhouette is unchanged. No nodules or masses. No focal infiltrates. IMPRESSION: 1. Support apparatus as above. 2. No acute abnormalities otherwise seen. Electronically Signed   By: Dorise Bullion III M.D   On: 04/14/2019 13:32   Dg Ercp Biliary & Pancreatic Ducts  Result Date: 04/14/2019 CLINICAL DATA:  Biliary obstruction. EXAM: ERCP TECHNIQUE: Multiple spot images obtained with the fluoroscopic device and submitted for interpretation post-procedure. COMPARISON:  CT of the abdomen on 03/23/2019 FINDINGS: Imaging with a C-arm  during the endoscopic procedure demonstrates cannulation of the common bile duct with contrast injection demonstrating dilatation of the common bile duct and apparent focal stricture of the distal common bile duct. Balloon dilatation was performed followed by placement a self expanding stent. IMPRESSION: Findings consistent with focal stricture of the distal common bile duct. Balloon dilatation was performed followed by placement of a self expanding stent. These images were submitted for radiologic interpretation only. Please see the procedural report for the amount of contrast and the fluoroscopy time utilized. Electronically Signed   By: Aletta Edouard M.D.   On: 04/14/2019 12:52   Dg Abd Portable 1v  Result Date: 04/15/2019 CLINICAL DATA:  Biliary obstruction. EXAM: PORTABLE ABDOMEN - 1 VIEW COMPARISON:  Yesterday. FINDINGS: Paucity of intestinal gas with no dilated bowel loops seen. Oral contrast in normal appearing cecum and inferior right colon. Gas in normal caliber rectum. Nasogastric tube tip in the mid stomach and side hole in the proximal stomach. Stable biliary stent. Mild lumbar and lower thoracic spine degenerative changes. Right hip degenerative changes. IMPRESSION: No acute abnormality. Electronically Signed   By: Claudie Revering M.D.   On: 04/15/2019 08:34   Dg Abd Portable 1v  Result Date: 04/14/2019 CLINICAL DATA:  OG tube placement. Perforated bowel. EXAM: PORTABLE ABDOMEN - 1 VIEW COMPARISON:  CT abdomen dated 03/23/2019. FINDINGS: OG tube appears well positioned in the stomach. Interval placement of a biliary stent which appears appropriately positioned. Visualized bowel gas pattern is nonobstructive. No definite free intraperitoneal air is seen under the diaphragms. Lung bases are clear. IMPRESSION: 1. OG tube appears well positioned in the stomach. 2. Interval placement of a biliary stent which appears appropriately positioned. 3. Nonobstructive bowel gas pattern. Electronically Signed    By: Franki Cabot M.D.   On: 04/14/2019 13:13        Scheduled Meds:  chlorhexidine  15 mL Mouth Rinse BID   Chlorhexidine Gluconate Cloth  6 each Topical Daily   indomethacin  100 mg Rectal Once   levothyroxine  50 mcg Oral QAC breakfast   mouth rinse  15 mL Mouth Rinse q12n4p   metoprolol tartrate  50 mg Oral Q6H   pantoprazole (PROTONIX) IV  40 mg Intravenous Q12H   sodium chloride flush  3 mL Intravenous Q12H   sodium chloride flush  3 mL Intravenous Q12H   Continuous Infusions:  sodium chloride     lactated ringers     piperacillin-tazobactam (ZOSYN)  IV 12.5  mL/hr at 04/15/19 0600     LOS: 3 days    Time spent: 35 minutes    Barb Merino, MD [facility] Pager [phone]

## 2019-04-15 NOTE — Progress Notes (Signed)
[facility] Progress Note  CC:  Jaundice, choledocholithiasis   Subjective: She is awake and alert. She was extubated yesterday. She is on room air. She pulled her NGT out last night. She remains NPO. No nausea or vomiting. No chest, upper back or abdominal pan. No BM. RN has notice a little confusion during the night.  Objective:  Vital signs in last 24 hours: Temp:  [97.4 F (36.3 C)-98.9 F (37.2 C)] 98.6 F (37 C) (11/01 0300) Pulse Rate:  [45-120] 83 (11/01 0600) Resp:  [12-25] 12 (11/01 0600) BP: (89-147)/(65-102) 115/76 (11/01 0600) SpO2:  [87 %-100 %] 99 % (11/01 0600) Arterial Line BP: (88-155)/(51-83) 88/82 (11/01 0600) FiO2 (%):  [30 %-50 %] 30 % (10/31 1715) Weight:  [46.9 kg] 46.9 kg (11/01 0500) Last BM Date: 04/11/19 General:   Alert in NAD. Eyes: + scleral icterus. PERRLA, conjunctiva pink.  Heart: Irregular rhythm, no murmur. Pulm: Lungs clear throughout, no crackles, wheezes or rhonchi. No crepitus. Abdomen: Flat, soft, nontender, hypoactive BS x 4 quads, no HSM, mid line and central chest scars intact. GU: Foley  Catheter draining darker orange urine, approximately 300cc in foley bag. Extremities:  Without edema.  Neurologic:  Alert and  oriented x3; She is answer questions appropriately. Speech is clear. She is moving all extremities.  Psych:  Alert and cooperative. Normal mood and affect.  Intake/Output from previous day: 10/31 0701 - 11/01 0700 In: 1141.7 [I.V.:970.5; IV Piggyback:171.3] Out: 1475 [Urine:1470; Blood:5] Intake/Output this shift: Total I/O In: 154.4 [IV Piggyback:154.4] Out: 420 [Urine:420]  Lab Results: Recent Labs    04/13/19 0349 04/14/19 0307 04/14/19 1430 04/15/19 0526  WBC 7.7 6.9  --  11.2*  HGB 10.6* 10.3* 11.9* 11.6*  HCT 31.2* 30.3* 35.0* 35.6*  PLT 367 326  --  358   BMET Recent Labs    04/13/19 0349 04/13/19 1625 04/14/19 0307 04/14/19 1430  NA 138 136 138 132*  K 2.4* 3.5 3.4* 3.9  CL  100 100 101  --   CO2 _0 --   GLUCOSE 120* 147* 123*  --   BUN <5* 7* 8  --   CREATININE 0.58 0.66 0.63  --   CALCIUM 8.4* 8.7* 8.9  --    LFT Recent Labs    04/13/19 0349  PROT 5.7*  ALBUMIN 2.4*  AST 148*  ALT 102*  ALKPHOS 379*  BILITOT 8.2*   PT/INR Recent Labs    04/12/19 0800 04/13/19 0349  LABPROT 14.6 15.4*  INR 1.2 1.2   Hepatitis Panel No results for input(s): HEPBSAG, HCVAB, HEPAIGM, HEPBIGM in the last 72 hours.  Dg Abd 1 View  Result Date: 04/14/2019 CLINICAL DATA:  NG tube placement EXAM: ABDOMEN - 1 VIEW COMPARISON:  04/14/2019 FINDINGS: The enteric tube projects over the gastric body. The bowel gas pattern is nonspecific and nonobstructive. A biliary stent is noted. IMPRESSION: Enteric tube projects over the gastric body. Electronically Signed   By: Constance Holster M.D.   On: 04/14/2019 19:07   Dg Abd 1 View  Result Date: 04/14/2019 CLINICAL DATA:  Evaluate OG tube EXAM: ABDOMEN - 1 VIEW COMPARISON:  None. FINDINGS: The OG tube terminates within the left upper quadrant, within the stomach. IMPRESSION: The OG tube terminates within the left upper quadrant, within the stomach. Electronically Signed   By: Dorise Bullion III M.D   On: 04/14/2019 13:33   Ct Abdomen Pelvis W Contrast  Result Date: 04/14/2019 CLINICAL DATA:  Perforation of the bile duct during ERCP with concern for retroperitoneal leak prior to placement a fully covered stents. No leak discovered post stent placement. EXAM: CT ABDOMEN AND PELVIS WITH CONTRAST TECHNIQUE: Multidetector CT imaging of the abdomen and pelvis was performed using the standard protocol following bolus administration of intravenous contrast. CONTRAST:  132m OMNIPAQUE IOHEXOL 300 MG/ML  SOLN COMPARISON:  March 23, 2019 FINDINGS: Lower chest: No acute abnormality. Hepatobiliary: Pneumobilia is consistent with history of recent ERCP and stent placement. There is a stent in the common bile duct extending to the  level of the duodenum. High attenuation material in the gallbladder is likely contrast. This is new since March 23, 2019. The common bile duct is patent. Hepatic steatosis is identified. No focal liver mass is noted. Pancreas: The pancreatic head is partially obscured by the common bile duct stent. No pancreatic masses noted. Spleen: Normal in size without focal abnormality. Adrenals/Urinary Tract: There is a cyst in the right kidney. The adrenal glands, kidneys, ureters, and bladder are otherwise unremarkable. There is a Foley catheter in the bladder which is not completely compressed. Air in the bladder is likely from the Foley catheter. Stomach/Bowel: An NG tube terminates in the stomach. The stomach and small bowel are normal. The colon is normal. The appendix is not seen but there is no secondary evidence of appendicitis. Vascular/Lymphatic: Atherosclerotic changes are seen in the nonaneurysmal aorta. No adenopathy. Reproductive: Status post hysterectomy. No adnexal masses. Other: No free fluid is seen in the pelvis.  No free air. Musculoskeletal: No acute or significant osseous findings. IMPRESSION: 1. No free fluid in the abdomen or pelvis to suggest bile leak. 2. Common bile duct stent as above. 3. Hepatic steatosis. 4. Atherosclerotic changes in the abdominal aorta. Electronically Signed   By: DDorise BullionIII M.D   On: 04/14/2019 15:40   Dg Chest Port 1 View  Result Date: 04/14/2019 CLINICAL DATA:  Evaluate ETT EXAM: PORTABLE CHEST 1 VIEW COMPARISON:  March 22, 2019 FINDINGS: No pneumothorax. An ET tube is been placed, terminating in the mid trachea, in good position. The NG tube terminates below today's film. Since the cardiomediastinal silhouette is unchanged. No nodules or masses. No focal infiltrates. IMPRESSION: 1. Support apparatus as above. 2. No acute abnormalities otherwise seen. Electronically Signed   By: DDorise BullionIII M.D   On: 04/14/2019 13:32   Dg Ercp Biliary & Pancreatic  Ducts  Result Date: 04/14/2019 CLINICAL DATA:  Biliary obstruction. EXAM: ERCP TECHNIQUE: Multiple spot images obtained with the fluoroscopic device and submitted for interpretation post-procedure. COMPARISON:  CT of the abdomen on 03/23/2019 FINDINGS: Imaging with a C-arm during the endoscopic procedure demonstrates cannulation of the common bile duct with contrast injection demonstrating dilatation of the common bile duct and apparent focal stricture of the distal common bile duct. Balloon dilatation was performed followed by placement a self expanding stent. IMPRESSION: Findings consistent with focal stricture of the distal common bile duct. Balloon dilatation was performed followed by placement of a self expanding stent. These images were submitted for radiologic interpretation only. Please see the procedural report for the amount of contrast and the fluoroscopy time utilized. Electronically Signed   By: GAletta EdouardM.D.   On: 04/14/2019 12:52   Dg Abd Portable 1v  Result Date: 04/14/2019 CLINICAL DATA:  OG tube placement. Perforated bowel. EXAM: PORTABLE ABDOMEN - 1 VIEW COMPARISON:  CT abdomen dated 03/23/2019. FINDINGS: OG tube appears well positioned in the stomach. Interval placement of a  biliary stent which appears appropriately positioned. Visualized bowel gas pattern is nonobstructive. No definite free intraperitoneal air is seen under the diaphragms. Lung bases are clear. IMPRESSION: 1. OG tube appears well positioned in the stomach. 2. Interval placement of a biliary stent which appears appropriately positioned. 3. Nonobstructive bowel gas pattern. Electronically Signed   By: Franki Cabot M.D.   On: 04/14/2019 13:13    Assessment / Plan:  65. 85 year old female with choledocholithiasis S/P ERCP sphincterotomy and sphincteroplasty, a few stone fragments removed, pancreatic stent placed 00/17/4944 complicated by a  perforation of the bile duct and duodenum. A covered metal biliary stent  was placed to cover the leak/perforation. Four hemostatic clips were placed to the area surrounding the papilla. She remained intubated and she was transferred to the ICU. General surgery was consulted. An abdominal/pelvi CT with water soluble contrast via NGT and IV did not show evidence of free fluid or air in the abdomen. No plans for surgical intervention required. Today WBC 11.2. She is afebrile. Alk phos 359 >> 379. AST 132 >>148. ALT 103 >> 102. T. Bili 6.8 >>8.2. No N/V or abdominal pain. Extubated on RA. NGT pulled out. -Continue Zosyn 3.382m IV Q 8 hrs -Zofran 466mPRN -Continue Protonix 4048mV BID -Lipase level added to today's am labs  -Repeat CBC, BMP, hepatic panel in am  -NPO for now -IVF per critical care team   2. Atrial Fibrillation, hemodynamically stable. No runs of rapid AF during the night. Off anticoagulation.  3. CAD, past CABG, pacemaker, dilation of ascending aorta. EF 45-50%.  4. Normocytic anemia. Today  Hg 11.6 >> 11.9 >> 10.3 >> 12.6. HCT 11.6. [facility]  EGD 03/26/2019 by  Dr. 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 Colonoscopy on the same date showed diverticulosis to the descending and sigmoid colon, most likely diverticular bleed. No current signs of active GI bleeding. -Monitor CBC -Continue to monitor for signs of GI bleeding   Further recommendations per Dr. ManRush Landmark Principal Problem:   Painless jaundice Active Problems:   Atrial fibrillation ([place] CAD (coronary artery disease)   Hypothyroidism   DCM (dilated cardiomyopathy) ([facility]   LOS: 3 days   ColNoralyn Pick1/06/2018, 6:21 AM

## 2019-04-15 NOTE — Progress Notes (Signed)
Progress Note  Patient Name: Angelica Hernandez Date of Encounter: 04/15/2019  Primary Cardiologist: Kate Sable, MD   Subjective   Denies chest pain, sob, or abdominal pain.  Inpatient Medications    Scheduled Meds:  chlorhexidine  15 mL Mouth Rinse BID   Chlorhexidine Gluconate Cloth  6 each Topical Daily   indomethacin  100 mg Rectal Once   levothyroxine  50 mcg Oral QAC breakfast   mouth rinse  15 mL Mouth Rinse q12n4p   metoprolol tartrate  50 mg Oral Q6H   pantoprazole (PROTONIX) IV  40 mg Intravenous Q12H   sodium chloride flush  3 mL Intravenous Q12H   sodium chloride flush  3 mL Intravenous Q12H   Continuous Infusions:  sodium chloride     piperacillin-tazobactam (ZOSYN)  IV 12.5 mL/hr at 04/15/19 0600   PRN Meds: sodium chloride, fentaNYL (SUBLIMAZE) injection, ondansetron **OR** ondansetron (ZOFRAN) IV, sodium chloride flush   Vital Signs    Vitals:   04/15/19 0600 04/15/19 0700 04/15/19 0800 04/15/19 0812  BP: 115/76 (!) 142/91 124/81 124/81  Pulse: 83 (!) 116 89 74  Resp: 12 17 14 16   Temp:    97.7 F (36.5 C)  TempSrc:    Oral  SpO2: 99% 99% 99% 100%  Weight:      Height:        Intake/Output Summary (Last 24 hours) at 04/15/2019 0912 Last data filed at 04/15/2019 0600 Gross per 24 hour  Intake 1141.74 ml  Output 1475 ml  Net -333.26 ml   Filed Weights   04/14/19 0459 04/14/19 0837 04/15/19 0500  Weight: 46.9 kg 46.9 kg 46.9 kg    Telemetry    Atrial fib with a CVR/RVR - Personally Reviewed  ECG    noen - Personally Reviewed  Physical Exam   GEN: Jaundiced, No acute distress.   Neck: No JVD Cardiac: IRIRR, no murmurs, rubs, or gallops.  Respiratory: Clear to auscultation bilaterally. GI: Soft, nontender, non-distended  MS: No edema; No deformity. Neuro:  Nonfocal  Psych: Normal affect   Labs    Chemistry Recent Labs  Lab 04/11/19 1538 04/12/19 0513 04/13/19 0349 04/13/19 1625 04/14/19 0307 04/14/19 1430  04/15/19 0526  NA 138  --  138 136 138 132* 135  K 3.3*  --  2.4* 3.5 3.4* 3.9 4.6  CL 98  --  100 100 101  --  100  CO2 26  --  26 23 24   --  22  GLUCOSE 134*  --  120* 147* 123*  --  128*  BUN 10  --  <5* 7* 8  --  11  CREATININE 0.80  --  0.58 0.66 0.63  --  0.70  CALCIUM 8.9  --  8.4* 8.7* 8.9  --  9.0  PROT 6.4*  --  5.7*  --   --   --  6.3*  ALBUMIN 2.8*  --  2.4*  --   --   --  2.8*  AST 211*  --  148*  --   --   --  132*  ALT 131*  --  102*  --   --   --  103*  ALKPHOS 469*  --  379*  --   --   --  359*  BILITOT 9.5* 8.9* 8.2*  --   --   --  6.8*  GFRNONAA >60  --  >60 >60 >60  --  >60  GFRAA >60  --  >60 >60 >60  --  >  60  ANIONGAP 14  --  12 13 13   --  13     Hematology Recent Labs  Lab 04/13/19 0349 04/14/19 0307 04/14/19 1430 04/15/19 0526  WBC 7.7 6.9  --  11.2*  RBC 3.61* 3.54*  --  3.98  HGB 10.6* 10.3* 11.9* 11.6*  HCT 31.2* 30.3* 35.0* 35.6*  MCV 86.4 85.6  --  89.4  MCH 29.4 29.1  --  29.1  MCHC 34.0 34.0  --  32.6  RDW 14.9 15.2  --  15.5  PLT 367 326  --  358    Cardiac EnzymesNo results for input(s): TROPONINI in the last 168 hours. No results for input(s): TROPIPOC in the last 168 hours.   BNPNo results for input(s): BNP, PROBNP in the last 168 hours.   DDimer No results for input(s): DDIMER in the last 168 hours.   Radiology    Dg Abd 1 View  Result Date: 04/14/2019 CLINICAL DATA:  NG tube placement EXAM: ABDOMEN - 1 VIEW COMPARISON:  04/14/2019 FINDINGS: The enteric tube projects over the gastric body. The bowel gas pattern is nonspecific and nonobstructive. A biliary stent is noted. IMPRESSION: Enteric tube projects over the gastric body. Electronically Signed   By: Constance Holster M.D.   On: 04/14/2019 19:07   Dg Abd 1 View  Result Date: 04/14/2019 CLINICAL DATA:  Evaluate OG tube EXAM: ABDOMEN - 1 VIEW COMPARISON:  None. FINDINGS: The OG tube terminates within the left upper quadrant, within the stomach. IMPRESSION: The OG tube  terminates within the left upper quadrant, within the stomach. Electronically Signed   By: Dorise Bullion III M.D   On: 04/14/2019 13:33   Ct Abdomen Pelvis W Contrast  Result Date: 04/14/2019 CLINICAL DATA:  Perforation of the bile duct during ERCP with concern for retroperitoneal leak prior to placement a fully covered stents. No leak discovered post stent placement. EXAM: CT ABDOMEN AND PELVIS WITH CONTRAST TECHNIQUE: Multidetector CT imaging of the abdomen and pelvis was performed using the standard protocol following bolus administration of intravenous contrast. CONTRAST:  152mL OMNIPAQUE IOHEXOL 300 MG/ML  SOLN COMPARISON:  March 23, 2019 FINDINGS: Lower chest: No acute abnormality. Hepatobiliary: Pneumobilia is consistent with history of recent ERCP and stent placement. There is a stent in the common bile duct extending to the level of the duodenum. High attenuation material in the gallbladder is likely contrast. This is new since March 23, 2019. The common bile duct is patent. Hepatic steatosis is identified. No focal liver mass is noted. Pancreas: The pancreatic head is partially obscured by the common bile duct stent. No pancreatic masses noted. Spleen: Normal in size without focal abnormality. Adrenals/Urinary Tract: There is a cyst in the right kidney. The adrenal glands, kidneys, ureters, and bladder are otherwise unremarkable. There is a Foley catheter in the bladder which is not completely compressed. Air in the bladder is likely from the Foley catheter. Stomach/Bowel: An NG tube terminates in the stomach. The stomach and small bowel are normal. The colon is normal. The appendix is not seen but there is no secondary evidence of appendicitis. Vascular/Lymphatic: Atherosclerotic changes are seen in the nonaneurysmal aorta. No adenopathy. Reproductive: Status post hysterectomy. No adnexal masses. Other: No free fluid is seen in the pelvis.  No free air. Musculoskeletal: No acute or significant  osseous findings. IMPRESSION: 1. No free fluid in the abdomen or pelvis to suggest bile leak. 2. Common bile duct stent as above. 3. Hepatic steatosis. 4. Atherosclerotic changes in  the abdominal aorta. Electronically Signed   By: Dorise Bullion III M.D   On: 04/14/2019 15:40   Dg Chest Port 1 View  Result Date: 04/14/2019 CLINICAL DATA:  Evaluate ETT EXAM: PORTABLE CHEST 1 VIEW COMPARISON:  March 22, 2019 FINDINGS: No pneumothorax. An ET tube is been placed, terminating in the mid trachea, in good position. The NG tube terminates below today's film. Since the cardiomediastinal silhouette is unchanged. No nodules or masses. No focal infiltrates. IMPRESSION: 1. Support apparatus as above. 2. No acute abnormalities otherwise seen. Electronically Signed   By: Dorise Bullion III M.D   On: 04/14/2019 13:32   Dg Ercp Biliary & Pancreatic Ducts  Result Date: 04/14/2019 CLINICAL DATA:  Biliary obstruction. EXAM: ERCP TECHNIQUE: Multiple spot images obtained with the fluoroscopic device and submitted for interpretation post-procedure. COMPARISON:  CT of the abdomen on 03/23/2019 FINDINGS: Imaging with a C-arm during the endoscopic procedure demonstrates cannulation of the common bile duct with contrast injection demonstrating dilatation of the common bile duct and apparent focal stricture of the distal common bile duct. Balloon dilatation was performed followed by placement a self expanding stent. IMPRESSION: Findings consistent with focal stricture of the distal common bile duct. Balloon dilatation was performed followed by placement of a self expanding stent. These images were submitted for radiologic interpretation only. Please see the procedural report for the amount of contrast and the fluoroscopy time utilized. Electronically Signed   By: Aletta Edouard M.D.   On: 04/14/2019 12:52   Dg Abd Portable 1v  Result Date: 04/15/2019 CLINICAL DATA:  Biliary obstruction. EXAM: PORTABLE ABDOMEN - 1 VIEW  COMPARISON:  Yesterday. FINDINGS: Paucity of intestinal gas with no dilated bowel loops seen. Oral contrast in normal appearing cecum and inferior right colon. Gas in normal caliber rectum. Nasogastric tube tip in the mid stomach and side hole in the proximal stomach. Stable biliary stent. Mild lumbar and lower thoracic spine degenerative changes. Right hip degenerative changes. IMPRESSION: No acute abnormality. Electronically Signed   By: Claudie Revering M.D.   On: 04/15/2019 08:34   Dg Abd Portable 1v  Result Date: 04/14/2019 CLINICAL DATA:  OG tube placement. Perforated bowel. EXAM: PORTABLE ABDOMEN - 1 VIEW COMPARISON:  CT abdomen dated 03/23/2019. FINDINGS: OG tube appears well positioned in the stomach. Interval placement of a biliary stent which appears appropriately positioned. Visualized bowel gas pattern is nonobstructive. No definite free intraperitoneal air is seen under the diaphragms. Lung bases are clear. IMPRESSION: 1. OG tube appears well positioned in the stomach. 2. Interval placement of a biliary stent which appears appropriately positioned. 3. Nonobstructive bowel gas pattern. Electronically Signed   By: Franki Cabot M.D.   On: 04/14/2019 13:13    Cardiac Studies   none  Patient Profile     85 y.o. female admitted with jaundice and atrial fib with a RVR after ERCP complicated by perforation, s/p placement of a covered stent.   Assessment & Plan    1. Atrial fib - she appears to tolerate HR's in the 100 range. I would not advance her rate control at this time. Would restart anti-coagulation when ok with the GI service. Continue rate control with metoprolol.  2. CAD - she denies anginal symptoms. No additional workup recommended.     For questions or updates, please contact [place] Please consult [email or web address] for contact info under Cardiology/STEMI.      Signed, Cristopher Peru, MD  04/15/2019, 9:12 AM  Patient ID: Angelica Hernandez, female  DOB: [birth year], 85 y.o.    MRN: [id number]

## 2019-04-15 NOTE — Progress Notes (Signed)
Manually calculated for time change.

## 2019-04-15 NOTE — Progress Notes (Signed)
Patient transferred from [facility], patient with poor safely awarness, bed mats in place, will order low bed.

## 2019-04-15 NOTE — Progress Notes (Signed)
Transferred patient to [place]. Patient in bed with rails up and bed alarm on.

## 2019-04-16 ENCOUNTER — Ambulatory Visit: Payer: Medicare Other | Admitting: Nurse Practitioner

## 2019-04-16 ENCOUNTER — Encounter (HOSPITAL_COMMUNITY): Payer: Self-pay | Admitting: Gastroenterology

## 2019-04-16 ENCOUNTER — Other Ambulatory Visit: Payer: Self-pay | Admitting: General Surgery

## 2019-04-16 ENCOUNTER — Other Ambulatory Visit: Payer: Self-pay | Admitting: Internal Medicine

## 2019-04-16 ENCOUNTER — Telehealth: Payer: Self-pay

## 2019-04-16 ENCOUNTER — Inpatient Hospital Stay (HOSPITAL_COMMUNITY): Payer: Medicare Other

## 2019-04-16 DIAGNOSIS — K8051 Calculus of bile duct without cholangitis or cholecystitis with obstruction: Secondary | ICD-10-CM

## 2019-04-16 DIAGNOSIS — K838 Other specified diseases of biliary tract: Secondary | ICD-10-CM

## 2019-04-16 DIAGNOSIS — T85528S Displacement of other gastrointestinal prosthetic devices, implants and grafts, sequela: Secondary | ICD-10-CM

## 2019-04-16 DIAGNOSIS — D649 Anemia, unspecified: Secondary | ICD-10-CM

## 2019-04-16 DIAGNOSIS — I4821 Permanent atrial fibrillation: Secondary | ICD-10-CM

## 2019-04-16 LAB — COMPREHENSIVE METABOLIC PANEL
ALT: 76 U/L — ABNORMAL HIGH (ref 0–44)
AST: 101 U/L — ABNORMAL HIGH (ref 15–41)
Albumin: 2.2 g/dL — ABNORMAL LOW (ref 3.5–5.0)
Alkaline Phosphatase: 263 U/L — ABNORMAL HIGH (ref 38–126)
Anion gap: 12 (ref 5–15)
BUN: 12 mg/dL (ref 8–23)
CO2: 24 mmol/L (ref 22–32)
Calcium: 8.3 mg/dL — ABNORMAL LOW (ref 8.9–10.3)
Chloride: 100 mmol/L (ref 98–111)
Creatinine, Ser: 0.74 mg/dL (ref 0.44–1.00)
GFR calc Af Amer: >60 mL/min (ref >60)
GFR calc non Af Amer: >60 mL/min (ref >60)
Glucose, Bld: 102 mg/dL — ABNORMAL HIGH (ref 70–99)
Potassium: 3.7 mmol/L (ref 3.5–5.1)
Sodium: 136 mmol/L (ref 135–145)
Total Bilirubin: 4.7 mg/dL — ABNORMAL HIGH (ref 0.3–1.2)
Total Protein: 5.1 g/dL — ABNORMAL LOW (ref 6.5–8.1)

## 2019-04-16 LAB — CBC WITH DIFFERENTIAL/PLATELET
Abs Immature Granulocytes: 0.05 10*3/uL (ref 0.00–0.07)
Basophils Absolute: 0 10*3/uL (ref 0.0–0.1)
Basophils Relative: 0 %
Eosinophils Absolute: 0 10*3/uL (ref 0.0–0.5)
Eosinophils Relative: 0 %
HCT: 30.3 % — ABNORMAL LOW (ref 36.0–46.0)
Hemoglobin: 9.8 g/dL — ABNORMAL LOW (ref 12.0–15.0)
Immature Granulocytes: 1 %
Lymphocytes Relative: 25 %
Lymphs Abs: 2.1 10*3/uL (ref 0.7–4.0)
MCH: 29.1 pg (ref 26.0–34.0)
MCHC: 32.3 g/dL (ref 30.0–36.0)
MCV: 89.9 fL (ref 80.0–100.0)
Monocytes Absolute: 0.7 10*3/uL (ref 0.1–1.0)
Monocytes Relative: 9 %
Neutro Abs: 5.5 10*3/uL (ref 1.7–7.7)
Neutrophils Relative %: 65 %
Platelets: 327 10*3/uL (ref 150–400)
RBC: 3.37 MIL/uL — ABNORMAL LOW (ref 3.87–5.11)
RDW: 15.8 % — ABNORMAL HIGH (ref 11.5–15.5)
WBC: 8.4 10*3/uL (ref 4.0–10.5)
nRBC: 0 % (ref 0.0–0.2)

## 2019-04-16 LAB — LIPASE, BLOOD: Lipase: 33 U/L (ref 11–51)

## 2019-04-16 LAB — MAGNESIUM: Magnesium: 1.6 mg/dL — ABNORMAL LOW (ref 1.7–2.4)

## 2019-04-16 LAB — GLUCOSE, CAPILLARY: Glucose-Capillary: 102 mg/dL — ABNORMAL HIGH (ref 70–99)

## 2019-04-16 LAB — PHOSPHORUS: Phosphorus: 3.5 mg/dL (ref 2.5–4.6)

## 2019-04-16 MED ORDER — METOPROLOL TARTRATE 50 MG PO TABS
50.0000 mg | ORAL_TABLET | Freq: Four times a day (QID) | ORAL | Status: AC
  Administered 2019-04-16 – 2019-04-17 (×3): 50 mg via ORAL
  Filled 2019-04-16 (×3): qty 1

## 2019-04-16 MED ORDER — METOPROLOL SUCCINATE ER 100 MG PO TB24
200.0000 mg | ORAL_TABLET | Freq: Every day | ORAL | Status: DC
  Administered 2019-04-17 – 2019-04-18 (×2): 200 mg via ORAL
  Filled 2019-04-16 (×3): qty 2

## 2019-04-16 MED ORDER — IOHEXOL 300 MG/ML  SOLN
250.0000 mL | Freq: Once | INTRAMUSCULAR | Status: AC | PRN
  Administered 2019-04-16: 50 mL via ORAL

## 2019-04-16 MED FILL — Dextrose Inj 5%: INTRAVENOUS | Qty: 100 | Status: AC

## 2019-04-16 MED FILL — Diltiazem HCl IV Soln 25 MG/5ML (5 MG/ML): INTRAVENOUS | Qty: 25 | Status: AC

## 2019-04-16 NOTE — Plan of Care (Signed)

## 2019-04-16 NOTE — Telephone Encounter (Signed)
-----   Message from Irving Copas., MD sent at 04/15/2019  4:08 AM EST ----- Angelica Hernandez, Patient needs a KUB in 10 days to make sure pancreas duct stent has fallen out. Follow-up in clinic with me in 2 weeks and okay to overbook at any time. Thanks. GM

## 2019-04-16 NOTE — Progress Notes (Signed)
2 Days Post-Op   Subjective/Chief Complaint: Doing fine, no n/v, no abdominal pain   Objective: Vital signs in last 24 hours: Temp:  [97.6 F (36.4 C)-98.4 F (36.9 C)] 98 F (36.7 C) (11/02 0735) Pulse Rate:  [57-109] 79 (11/02 0735) Resp:  [14-29] 18 (11/02 0735) BP: (85-135)/(59-105) 115/72 (11/02 0735) SpO2:  [92 %-100 %] 98 % (11/02 0735) Arterial Line BP: (123)/(114) 123/114 (11/01 0900) Weight:  [50.8 kg-50.9 kg] 50.9 kg (11/02 0427) Last BM Date: 04/11/19  Intake/Output from previous day: 11/01 0701 - 11/02 0700 In: 803.6 [P.O.:15; I.V.:664.6; IV Piggyback:123.9] Out: 225 [Urine:225] Intake/Output this shift: No intake/output data recorded.  GI: soft nontender nondistended  Lab Results:  Recent Labs    04/15/19 0526 04/16/19 0609  WBC 11.2* 8.4  HGB 11.6* 9.8*  HCT 35.6* 30.3*  PLT 358 327   BMET Recent Labs    04/15/19 0526 04/16/19 0609  NA 135 136  K 4.6 3.7  CL 100 100  CO2 22 24  GLUCOSE 128* 102*  BUN 11 12  CREATININE 0.70 0.74  CALCIUM 9.0 8.3*   PT/INR No results for input(s): LABPROT, INR in the last 72 hours. ABG Recent Labs    04/14/19 1430  PHART 7.455*  HCO3 23.3    Studies/Results: Dg Abd 1 View  Result Date: 04/14/2019 CLINICAL DATA:  NG tube placement EXAM: ABDOMEN - 1 VIEW COMPARISON:  04/14/2019 FINDINGS: The enteric tube projects over the gastric body. The bowel gas pattern is nonspecific and nonobstructive. A biliary stent is noted. IMPRESSION: Enteric tube projects over the gastric body. Electronically Signed   By: Constance Holster M.D.   On: 04/14/2019 19:07   Dg Abd 1 View  Result Date: 04/14/2019 CLINICAL DATA:  Evaluate OG tube EXAM: ABDOMEN - 1 VIEW COMPARISON:  None. FINDINGS: The OG tube terminates within the left upper quadrant, within the stomach. IMPRESSION: The OG tube terminates within the left upper quadrant, within the stomach. Electronically Signed   By: Dorise Bullion III M.D   On: 04/14/2019  13:33   Ct Abdomen Pelvis W Contrast  Result Date: 04/14/2019 CLINICAL DATA:  Perforation of the bile duct during ERCP with concern for retroperitoneal leak prior to placement a fully covered stents. No leak discovered post stent placement. EXAM: CT ABDOMEN AND PELVIS WITH CONTRAST TECHNIQUE: Multidetector CT imaging of the abdomen and pelvis was performed using the standard protocol following bolus administration of intravenous contrast. CONTRAST:  155mL OMNIPAQUE IOHEXOL 300 MG/ML  SOLN COMPARISON:  March 23, 2019 FINDINGS: Lower chest: No acute abnormality. Hepatobiliary: Pneumobilia is consistent with history of recent ERCP and stent placement. There is a stent in the common bile duct extending to the level of the duodenum. High attenuation material in the gallbladder is likely contrast. This is new since March 23, 2019. The common bile duct is patent. Hepatic steatosis is identified. No focal liver mass is noted. Pancreas: The pancreatic head is partially obscured by the common bile duct stent. No pancreatic masses noted. Spleen: Normal in size without focal abnormality. Adrenals/Urinary Tract: There is a cyst in the right kidney. The adrenal glands, kidneys, ureters, and bladder are otherwise unremarkable. There is a Foley catheter in the bladder which is not completely compressed. Air in the bladder is likely from the Foley catheter. Stomach/Bowel: An NG tube terminates in the stomach. The stomach and small bowel are normal. The colon is normal. The appendix is not seen but there is no secondary evidence of appendicitis. Vascular/Lymphatic:  Atherosclerotic changes are seen in the nonaneurysmal aorta. No adenopathy. Reproductive: Status post hysterectomy. No adnexal masses. Other: No free fluid is seen in the pelvis.  No free air. Musculoskeletal: No acute or significant osseous findings. IMPRESSION: 1. No free fluid in the abdomen or pelvis to suggest bile leak. 2. Common bile duct stent as above. 3.  Hepatic steatosis. 4. Atherosclerotic changes in the abdominal aorta. Electronically Signed   By: Dorise Bullion III M.D   On: 04/14/2019 15:40   Dg Chest Port 1 View  Result Date: 04/14/2019 CLINICAL DATA:  Evaluate ETT EXAM: PORTABLE CHEST 1 VIEW COMPARISON:  March 22, 2019 FINDINGS: No pneumothorax. An ET tube is been placed, terminating in the mid trachea, in good position. The NG tube terminates below today's film. Since the cardiomediastinal silhouette is unchanged. No nodules or masses. No focal infiltrates. IMPRESSION: 1. Support apparatus as above. 2. No acute abnormalities otherwise seen. Electronically Signed   By: Dorise Bullion III M.D   On: 04/14/2019 13:32   Dg Ercp Biliary & Pancreatic Ducts  Result Date: 04/14/2019 CLINICAL DATA:  Biliary obstruction. EXAM: ERCP TECHNIQUE: Multiple spot images obtained with the fluoroscopic device and submitted for interpretation post-procedure. COMPARISON:  CT of the abdomen on 03/23/2019 FINDINGS: Imaging with a C-arm during the endoscopic procedure demonstrates cannulation of the common bile duct with contrast injection demonstrating dilatation of the common bile duct and apparent focal stricture of the distal common bile duct. Balloon dilatation was performed followed by placement a self expanding stent. IMPRESSION: Findings consistent with focal stricture of the distal common bile duct. Balloon dilatation was performed followed by placement of a self expanding stent. These images were submitted for radiologic interpretation only. Please see the procedural report for the amount of contrast and the fluoroscopy time utilized. Electronically Signed   By: Aletta Edouard M.D.   On: 04/14/2019 12:52   Dg Abd 2 Views  Result Date: 04/15/2019 CLINICAL DATA:  Status post ERCP. EXAM: ABDOMEN - 2 VIEW COMPARISON:  Plain film of the abdomen from earlier same day. Plain film of the abdomen dated 04/14/2019. CT abdomen dated 04/14/2019. FINDINGS: Biliary  stent appears stable in position/configuration. Bowel gas pattern is nonobstructive. No evidence of soft tissue mass or abnormal fluid collection. No evidence of free intraperitoneal air appreciated. No evidence of renal or ureteral calculi. Lung bases are clear. IMPRESSION: 1. Biliary stent appears stable in position/configuration. 2. Nonobstructive bowel gas pattern. No acute findings. Electronically Signed   By: Franki Cabot M.D.   On: 04/15/2019 15:15   Dg Abd Portable 1v  Result Date: 04/15/2019 CLINICAL DATA:  Biliary obstruction. EXAM: PORTABLE ABDOMEN - 1 VIEW COMPARISON:  Yesterday. FINDINGS: Paucity of intestinal gas with no dilated bowel loops seen. Oral contrast in normal appearing cecum and inferior right colon. Gas in normal caliber rectum. Nasogastric tube tip in the mid stomach and side hole in the proximal stomach. Stable biliary stent. Mild lumbar and lower thoracic spine degenerative changes. Right hip degenerative changes. IMPRESSION: No acute abnormality. Electronically Signed   By: Claudie Revering M.D.   On: 04/15/2019 08:34   Dg Abd Portable 1v  Result Date: 04/14/2019 CLINICAL DATA:  OG tube placement. Perforated bowel. EXAM: PORTABLE ABDOMEN - 1 VIEW COMPARISON:  CT abdomen dated 03/23/2019. FINDINGS: OG tube appears well positioned in the stomach. Interval placement of a biliary stent which appears appropriately positioned. Visualized bowel gas pattern is nonobstructive. No definite free intraperitoneal air is seen under the diaphragms. Lung  bases are clear. IMPRESSION: 1. OG tube appears well positioned in the stomach. 2. Interval placement of a biliary stent which appears appropriately positioned. 3. Nonobstructive bowel gas pattern. Electronically Signed   By: Franki Cabot M.D.   On: 04/14/2019 13:13    Anti-infectives: Anti-infectives (From admission, onward)   Start     Dose/Rate Route Frequency Ordered Stop   04/14/19 1600  piperacillin-tazobactam (ZOSYN) IVPB 3.375 g      3.375 g 12.5 mL/hr over 240 Minutes Intravenous Every 8 hours 04/14/19 1448     04/14/19 1400  piperacillin-tazobactam (ZOSYN) IVPB 3.375 g  Status:  Discontinued     3.375 g 100 mL/hr over 30 Minutes Intravenous Every 8 hours 04/14/19 1337 04/14/19 1348   04/14/19 1400  piperacillin-tazobactam (ZOSYN) IVPB 3.375 g  Status:  Discontinued     3.375 g 12.5 mL/hr over 240 Minutes Intravenous Every 8 hours 04/14/19 1350 04/14/19 1448   04/14/19 0715  ampicillin-sulbactam (UNASYN) 1.5 g in sodium chloride 0.9 % 100 mL IVPB     1.5 g 200 mL/hr over 30 Minutes Intravenous  Once 04/14/19 0707 04/14/19 1012      Assessment/Plan: Iatrogenic perforation of distal cbd/ampulla Likely cholelithiasis as source -can advance diet as tolerated -will schedule her for lap chole in about 3 weeks and see her in office before that -would be helpful to have cards clearance note for surgery in 3 weeks  Rolm Bookbinder 04/16/2019

## 2019-04-16 NOTE — Progress Notes (Signed)
[facility] Progress Note  CC:  I'm feeling better, let me go home  Assessment / Plan: Iatrogenic perforation of distal CBD/ampulla. Symptomatic choledocholithiasis S/P ERCP sphincterotomy and sphincteroplasty 04/14/19, a few stone fragments removed, pancreatic stent placed [id number] complicated by a  perforation of the bile duct and duodenum. A covered metal biliary stent was placed to cover the leak/perforation. Four hemostatic clips were placed to the area surrounding the papilla. No leak or perforation seen on CT scan 04/14/19. UGI today showed no leak.   - Advance to clear liquid diet today.  - Consider diet advance if she remains stable over the next 48 hours.  - Okay for heparin when hemoglobin stable  - Continue Zosyn. Complete 10 days of antibiotics given her high risk for cholecystitis.  - KUB as an outpatient in 2 weeks.   - Will arrange for gallbladder stent to be pulled in 6-8 weeks.  - Cholecystectomy per surgery.  - Outpatient follow-up with Dr. Rush Landmark 05/01/19   Normocytic anemia. Hemglobin declined to 9.8 today from 11.6 yesterday.  BUN remains normal at 12. No overt GI bleeding.  EGD 03/26/2019 by Dr. 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 Colonoscopy on the same date showed diverticulosis to the descending and sigmoid colon, most likely diverticular bleed. No current signs of active GI bleeding.   - follow serial hgb/hct with transfusion if needed.  - anticoagulation on hold.    - okay for heparin when hemoglobin stable  - continue IV PPI BID  Subjective: No GI complaints today. No family present. Wants to go home.   Objective:  Vital signs in last 24 hours: Temp:  [97.6 F (36.4 C)-98.4 F (36.9 C)] 98 F (36.7 C) (11/02 0735) Pulse Rate:  [57-109] 79 (11/02 0735) Resp:  [14-29] 18 (11/02 0735) BP: (85-122)/(59-105) 115/72 (11/02 0735) SpO2:  [92 %-99 %] 98 % (11/02 0735) Weight:  [50.8 kg-50.9 kg] 50.9 kg (11/02  0427) Last BM Date: 04/11/19 General:   Alert, in NAD. Scleral icterus present.  Heart:  Irregular rhythm; no murmurs Pulm: Clear anteriorly; no wheezing Abdomen:  Soft. Nontender. Nondistended. Hypoactive bowel sounds. No rebound or guarding. LAD: No inguinal or umbilical LAD Extremities:  Without edema. Neurologic:  Alert and  oriented x4;  grossly normal neurologically. Psych:  Alert and cooperative. Normal mood and affect.  Intake/Output from previous day: 11/01 0701 - 11/02 0700 In: 803.6 [P.O.:15; I.V.:664.6; IV Piggyback:123.9] Out: 225 [Urine:225] Intake/Output this shift: No intake/output data recorded.  Lab Results: Recent Labs    04/14/19 0307 04/14/19 1430 04/15/19 0526 04/16/19 0609  WBC 6.9  --  11.2* 8.4  HGB 10.3* 11.9* 11.6* 9.8*  HCT 30.3* 35.0* 35.6* 30.3*  PLT 326  --  358 327   BMET Recent Labs    04/14/19 0307 04/14/19 1430 04/15/19 0526 04/16/19 0609  NA 138 132* 135 136  K 3.4* 3.9 4.6 3.7  CL 101  --  100 100  CO2 24  --  22 24  GLUCOSE 123*  --  128* 102*  BUN 8  --  11 12  CREATININE 0.63  --  0.70 0.74  CALCIUM 8.9  --  9.0 8.3*   LFT Recent Labs    04/16/19 0609  PROT 5.1*  ALBUMIN 2.2*  AST 101*  ALT 76*  ALKPHOS 263*  BILITOT 4.7*    WATER SOLUBLE UPPER GI SERIES: THIN BARIUM UTILIZED AFTER WATER-SOLUBLE CONTRAST  TECHNIQUE: Single-column upper GI series was performed  using water soluble contrast.  CONTRAST:  Moderate-sized with contrast followed by thin barium.  COMPARISON:  Prior exams.  FLUOROSCOPY TIME:  Fluoroscopy Time:  2 minutes and 0 seconds.  Radiation Exposure Index (if provided by the fluoroscopic device): 20 mGy.  Number of Acquired Spot Images: 13  FINDINGS: Initial scout radiograph shows right upper quadrant biliary stent. Normal bowel gas pattern.  Esophagus is normal course and in caliber.  No mucosal abnormality.  Stomach is normal in overall size and configuration with no  mucosal abnormality.  Normal appearance of the duodenum with normal duodenal fold pattern.  No contrast extravasation to indicate a residual leak.  Contrast promptly impede from the stomach through the duodenum into the jejunum.  IMPRESSION: 1. No evidence of a perforation/leak. 2. Negative exam. No masses, strictures or evidence of inflammation.    LOS: 4 days   Thornton Park  04/16/2019, 10:51 AM

## 2019-04-16 NOTE — Telephone Encounter (Signed)
Appt made for 05/01/19 at 1:30 pm with Dr Rush Landmark.  KUB in system to be done in 2 weeks.  Pt to be notified at discharge.

## 2019-04-16 NOTE — Progress Notes (Signed)
Progress Note  Patient Name: Angelica Hernandez Date of Encounter: 04/16/2019  Primary Cardiologist: Kate Sable, MD   Subjective   Feeling well.  No complaints.  Wants to go home.   Inpatient Medications    Scheduled Meds:  chlorhexidine  15 mL Mouth Rinse BID   Chlorhexidine Gluconate Cloth  6 each Topical Daily   indomethacin  100 mg Rectal Once   levothyroxine  50 mcg Oral QAC breakfast   mouth rinse  15 mL Mouth Rinse q12n4p   metoprolol tartrate  50 mg Oral Q6H   pantoprazole  40 mg Oral BID   sodium chloride flush  3 mL Intravenous Q12H   sodium chloride flush  3 mL Intravenous Q12H   Continuous Infusions:  sodium chloride Stopped (04/15/19 1857)   lactated ringers 100 mL/hr at 04/16/19 0523   piperacillin-tazobactam (ZOSYN)  IV 3.375 g (04/16/19 0516)   PRN Meds: sodium chloride, fentaNYL (SUBLIMAZE) injection, ondansetron **OR** ondansetron (ZOFRAN) IV, sodium chloride flush   Vital Signs    Vitals:   04/16/19 0427 04/16/19 0449 04/16/19 0600 04/16/19 0735  BP: (!) 118/105   115/72  Pulse: (!) 57 97  79  Resp: 14   18  Temp: 97.6 F (36.4 C)   98 F (36.7 C)  TempSrc: Oral     SpO2:   99% 98%  Weight: 50.9 kg     Height:        Intake/Output Summary (Last 24 hours) at 04/16/2019 1016 Last data filed at 04/16/2019 0523 Gross per 24 hour  Intake 762.6 ml  Output 135 ml  Net 627.6 ml   Last 3 Weights 04/16/2019 04/15/2019 04/15/2019  Weight (lbs) 112 lb 4.8 oz 112 lb 112 lb  Weight (kg) 50.939 kg 50.803 kg 50.803 kg      Telemetry    Atrial fibrillation.  Rate mostly <100 bpm.  - Personally Reviewed  ECG    N/a - Personally Reviewed  Physical Exam   VS:  BP 115/72 (BP Location: Right Arm)    Pulse 79    Temp 98 F (36.7 C)    Resp 18    Ht 5\' 6"  (1.676 m)    Wt 50.9 kg Comment: scale a   SpO2 98%    BMI 18.13 kg/m  , BMI Body mass index is 18.13 kg/m. GENERAL:  Frail, elderly woman in no acute distress.   HEENT: Pupils equal  round and reactive, fundi not visualized, oral mucosa unremarkable NECK:  No jugular venous distention, waveform within normal limits, carotid upstroke brisk and symmetric, no bruits LUNGS:  Clear to auscultation bilaterally HEART:  Irregularly.  PMI not displaced or sustained,S1 and S2 within normal limits, no S3, no S4, no clicks, no rubs, II/VI systolic murmur ABD:  Flat, positive bowel sounds normal in frequency in pitch, no bruits, no rebound, no guarding, no midline pulsatile mass, no hepatomegaly, no splenomegaly EXT:  2 plus pulses throughout, no edema, no cyanosis no clubbing SKIN:  No rashes no nodules NEURO:  Cranial nerves II through XII grossly intact, motor grossly intact throughout PSYCH:  Cognitively intact, oriented to person place and time   Labs    High Sensitivity Troponin:   Recent Labs  Lab 03/22/19 1959  TROPONINIHS 8      Chemistry Recent Labs  Lab 04/13/19 0349  04/14/19 0307 04/14/19 1430 04/15/19 0526 04/16/19 0609  NA 138   < > 138 132* 135 136  K 2.4*   < > 3.4*  3.9 4.6 3.7  CL 100   < > 101  --  100 100  CO2 26   < > 24  --  22 24  GLUCOSE 120*   < > 123*  --  128* 102*  BUN <5*   < > 8  --  11 12  CREATININE 0.58   < > 0.63  --  0.70 0.74  CALCIUM 8.4*   < > 8.9  --  9.0 8.3*  PROT 5.7*  --   --   --  6.3* 5.1*  ALBUMIN 2.4*  --   --   --  2.8* 2.2*  AST 148*  --   --   --  132* 101*  ALT 102*  --   --   --  103* 76*  ALKPHOS 379*  --   --   --  359* 263*  BILITOT 8.2*  --   --   --  6.8* 4.7*  GFRNONAA >60   < > >60  --  >60 >60  GFRAA >60   < > >60  --  >60 >60  ANIONGAP 12   < > 13  --  13 12   < > = values in this interval not displayed.     Hematology Recent Labs  Lab 04/14/19 0307 04/14/19 1430 04/15/19 0526 04/16/19 0609  WBC 6.9  --  11.2* 8.4  RBC 3.54*  --  3.98 3.37*  HGB 10.3* 11.9* 11.6* 9.8*  HCT 30.3* 35.0* 35.6* 30.3*  MCV 85.6  --  89.4 89.9  MCH 29.1  --  29.1 29.1  MCHC 34.0  --  32.6 32.3  RDW 15.2  --   15.5 15.8*  PLT 326  --  358 327    BNPNo results for input(s): BNP, PROBNP in the last 168 hours.   DDimer No results for input(s): DDIMER in the last 168 hours.   Radiology    Dg Abd 1 View  Result Date: 04/14/2019 CLINICAL DATA:  NG tube placement EXAM: ABDOMEN - 1 VIEW COMPARISON:  04/14/2019 FINDINGS: The enteric tube projects over the gastric body. The bowel gas pattern is nonspecific and nonobstructive. A biliary stent is noted. IMPRESSION: Enteric tube projects over the gastric body. Electronically Signed   By: Constance Holster M.D.   On: 04/14/2019 19:07   Dg Abd 1 View  Result Date: 04/14/2019 CLINICAL DATA:  Evaluate OG tube EXAM: ABDOMEN - 1 VIEW COMPARISON:  None. FINDINGS: The OG tube terminates within the left upper quadrant, within the stomach. IMPRESSION: The OG tube terminates within the left upper quadrant, within the stomach. Electronically Signed   By: Dorise Bullion III M.D   On: 04/14/2019 13:33   Ct Abdomen Pelvis W Contrast  Result Date: 04/14/2019 CLINICAL DATA:  Perforation of the bile duct during ERCP with concern for retroperitoneal leak prior to placement a fully covered stents. No leak discovered post stent placement. EXAM: CT ABDOMEN AND PELVIS WITH CONTRAST TECHNIQUE: Multidetector CT imaging of the abdomen and pelvis was performed using the standard protocol following bolus administration of intravenous contrast. CONTRAST:  171mL OMNIPAQUE IOHEXOL 300 MG/ML  SOLN COMPARISON:  March 23, 2019 FINDINGS: Lower chest: No acute abnormality. Hepatobiliary: Pneumobilia is consistent with history of recent ERCP and stent placement. There is a stent in the common bile duct extending to the level of the duodenum. High attenuation material in the gallbladder is likely contrast. This is new since March 23, 2019. The common bile duct  is patent. Hepatic steatosis is identified. No focal liver mass is noted. Pancreas: The pancreatic head is partially obscured by the  common bile duct stent. No pancreatic masses noted. Spleen: Normal in size without focal abnormality. Adrenals/Urinary Tract: There is a cyst in the right kidney. The adrenal glands, kidneys, ureters, and bladder are otherwise unremarkable. There is a Foley catheter in the bladder which is not completely compressed. Air in the bladder is likely from the Foley catheter. Stomach/Bowel: An NG tube terminates in the stomach. The stomach and small bowel are normal. The colon is normal. The appendix is not seen but there is no secondary evidence of appendicitis. Vascular/Lymphatic: Atherosclerotic changes are seen in the nonaneurysmal aorta. No adenopathy. Reproductive: Status post hysterectomy. No adnexal masses. Other: No free fluid is seen in the pelvis.  No free air. Musculoskeletal: No acute or significant osseous findings. IMPRESSION: 1. No free fluid in the abdomen or pelvis to suggest bile leak. 2. Common bile duct stent as above. 3. Hepatic steatosis. 4. Atherosclerotic changes in the abdominal aorta. Electronically Signed   By: Dorise Bullion III M.D   On: 04/14/2019 15:40   Dg Chest Port 1 View  Result Date: 04/14/2019 CLINICAL DATA:  Evaluate ETT EXAM: PORTABLE CHEST 1 VIEW COMPARISON:  March 22, 2019 FINDINGS: No pneumothorax. An ET tube is been placed, terminating in the mid trachea, in good position. The NG tube terminates below today's film. Since the cardiomediastinal silhouette is unchanged. No nodules or masses. No focal infiltrates. IMPRESSION: 1. Support apparatus as above. 2. No acute abnormalities otherwise seen. Electronically Signed   By: Dorise Bullion III M.D   On: 04/14/2019 13:32   Dg Ercp Biliary & Pancreatic Ducts  Result Date: 04/14/2019 CLINICAL DATA:  Biliary obstruction. EXAM: ERCP TECHNIQUE: Multiple spot images obtained with the fluoroscopic device and submitted for interpretation post-procedure. COMPARISON:  CT of the abdomen on 03/23/2019 FINDINGS: Imaging with a C-arm  during the endoscopic procedure demonstrates cannulation of the common bile duct with contrast injection demonstrating dilatation of the common bile duct and apparent focal stricture of the distal common bile duct. Balloon dilatation was performed followed by placement a self expanding stent. IMPRESSION: Findings consistent with focal stricture of the distal common bile duct. Balloon dilatation was performed followed by placement of a self expanding stent. These images were submitted for radiologic interpretation only. Please see the procedural report for the amount of contrast and the fluoroscopy time utilized. Electronically Signed   By: Aletta Edouard M.D.   On: 04/14/2019 12:52   Dg Abd 2 Views  Result Date: 04/15/2019 CLINICAL DATA:  Status post ERCP. EXAM: ABDOMEN - 2 VIEW COMPARISON:  Plain film of the abdomen from earlier same day. Plain film of the abdomen dated 04/14/2019. CT abdomen dated 04/14/2019. FINDINGS: Biliary stent appears stable in position/configuration. Bowel gas pattern is nonobstructive. No evidence of soft tissue mass or abnormal fluid collection. No evidence of free intraperitoneal air appreciated. No evidence of renal or ureteral calculi. Lung bases are clear. IMPRESSION: 1. Biliary stent appears stable in position/configuration. 2. Nonobstructive bowel gas pattern. No acute findings. Electronically Signed   By: Franki Cabot M.D.   On: 04/15/2019 15:15   Dg Abd Portable 1v  Result Date: 04/15/2019 CLINICAL DATA:  Biliary obstruction. EXAM: PORTABLE ABDOMEN - 1 VIEW COMPARISON:  Yesterday. FINDINGS: Paucity of intestinal gas with no dilated bowel loops seen. Oral contrast in normal appearing cecum and inferior right colon. Gas in normal caliber rectum. Nasogastric  tube tip in the mid stomach and side hole in the proximal stomach. Stable biliary stent. Mild lumbar and lower thoracic spine degenerative changes. Right hip degenerative changes. IMPRESSION: No acute abnormality.  Electronically Signed   By: Claudie Revering M.D.   On: 04/15/2019 08:34   Dg Abd Portable 1v  Result Date: 04/14/2019 CLINICAL DATA:  OG tube placement. Perforated bowel. EXAM: PORTABLE ABDOMEN - 1 VIEW COMPARISON:  CT abdomen dated 03/23/2019. FINDINGS: OG tube appears well positioned in the stomach. Interval placement of a biliary stent which appears appropriately positioned. Visualized bowel gas pattern is nonobstructive. No definite free intraperitoneal air is seen under the diaphragms. Lung bases are clear. IMPRESSION: 1. OG tube appears well positioned in the stomach. 2. Interval placement of a biliary stent which appears appropriately positioned. 3. Nonobstructive bowel gas pattern. Electronically Signed   By: Franki Cabot M.D.   On: 04/14/2019 13:13   Dg Duanne Limerick W Single Cm (sol Or Thin Ba)  Result Date: 04/16/2019 CLINICAL DATA:  Patient underwent ERCP. A perforation is noted at the duodenum, distal bile duct, treated with stent placement. Current evaluation is to assess for leak. EXAM: WATER SOLUBLE UPPER GI SERIES: THIN BARIUM UTILIZED AFTER WATER-SOLUBLE CONTRAST TECHNIQUE: Single-column upper GI series was performed using water soluble contrast. CONTRAST:  Moderate-sized with contrast followed by thin barium. COMPARISON:  Prior exams. FLUOROSCOPY TIME:  Fluoroscopy Time:  2 minutes and 0 seconds. Radiation Exposure Index (if provided by the fluoroscopic device): 20 mGy. Number of Acquired Spot Images: 13 FINDINGS: Initial scout radiograph shows right upper quadrant biliary stent. Normal bowel gas pattern. Esophagus is normal course and in caliber.  No mucosal abnormality. Stomach is normal in overall size and configuration with no mucosal abnormality. Normal appearance of the duodenum with normal duodenal fold pattern. No contrast extravasation to indicate a residual leak. Contrast promptly impede from the stomach through the duodenum into the jejunum. IMPRESSION: 1. No evidence of a  perforation/leak. 2. Negative exam. No masses, strictures or evidence of inflammation. Electronically Signed   By: Lajean Manes M.D.   On: 04/16/2019 09:49    Cardiac Studies   Echo 02/22/18: Study Conclusions  - Left ventricle: The cavity size was normal. Wall thickness was   normal. Systolic function was mildly reduced. The estimated   ejection fraction was in the range of 45% to 50%. Diffuse   hypokinesis. Abnormal global longitudinal strain of -12.2%. The   study was not technically sufficient to allow evaluation of LV   diastolic dysfunction due to atrial fibrillation. - Aortic valve: Mildly calcified annulus. Trileaflet. - Mitral valve: Mildly calcified annulus. There was mild   regurgitation. - Right ventricle: Pacer wire or catheter noted in right ventricle. - Right atrium: Pacer wire or catheter noted in right atrium. - Atrial septum: No defect or patent foramen ovale was identified. - Tricuspid valve: There was moderate regurgitation. - Pulmonary arteries: PA peak pressure: 35 mm Hg (S). - Pericardium, extracardiac: There was no pericardial effusion.  Patient Profile     85 y.o. female with CAD status post CABG in the 1980s, sick sinus syndrome status post pacemaker, permanent atrial fibrillation, admitted with jaundice and atrial fibrillation with RVR after ERCP.  Assessment & Plan    # PAF:  She remains in atrial fibrillation and is asymptomatic.  Rate is well-controlled.  Her home diltiazem is on hold.  She has been receiving metoprolol 50 mg every 6 hours.  We will consolidate this to metoprolol succinate 200  mg daily.  LVEF was 45 to 50% when last assessed in 2019.  Anticoagulation has been on hold due to anemia.  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  Colonoscopy revealed diverticulosis and there is concern for prior diverticular bleeding.  GI has cleared her to start heparin when hemoglobin is stable.  # CAD:  Status post remote  CABG.  Currently asymptomatic.  Beta-blocker as above.  Not on aspirin 2/2 GI bleed.  # Hypertension:  BP has been low.  Her home diltiazem and benazepril are on hold.  Consolidate metoprolol as above.      For questions or updates, please contact [place] Please consult [email or web address] for contact info under        Signed, Skeet Latch, MD  04/16/2019, 10:16 AM

## 2019-04-16 NOTE — Progress Notes (Signed)
PROGRESS NOTE    Angelica Hernandez  [id number] DOB: 09-03-1933 DOA: 04/11/2019 PCP: Caryl Bis, MD    Brief Narrative:  Patient with multiple chronic issues including coronary artery disease, CABG, symptomatic bradycardia with pacemaker, chronic systolic heart failure recent hospitalization due to GI bleed, was found with slightly elevated bilirubin and dilated common bile duct.  Plan were for outpatient EUS, repeat bilirubin were elevated and was brought to emergency room.  She had generalized weakness and yellowness.  No other particular features.  Patient was in the hospital, developed A. fib with RVR.  Underwent EGD, ERCP on 04/14/2019 where she was found to have perforation of CBD and duodenum, treated with biliary stent with good results.  Subsequent CT scan did not show any free air.  Was admitted to ICU for monitoring and transferred out.    Assessment & Plan:   Principal Problem:   Painless jaundice Active Problems:   Atrial fibrillation (HCC)   CAD (coronary artery disease)   Hypothyroidism   DCM (dilated cardiomyopathy) (HCC)  Biliary ductal dilatation/choledocholithiasis with obstructive jaundice: Status post ERCP and biliary stenting. Clinically improving.  Lipase is normal.  LFTs and bilirubin continues to improve.  Biopsies pending.  Clinical examination not consistent with cancer as per reports.  CBD perforation with concern of bile leak, contained duodenal perforation, underwent metal stenting with good results.  Clinically without evidence of peritonitis.  CT scans and repeat KUBs with no pneumoperitoneum.  Seen by surgery.  Conservative management. Repeat barium swallow today with no evidence of extravasation. Advancing to clear liquid diet. Adequate IV fluids.  PPI IV twice daily.  IV Zosyn for prophylaxis.  Close monitoring with serial abdominal exam.  As per GI plan, probable discharge home tomorrow on oral Augmentin with outpatient cholecystectomy planned.   Stent removal planned.  Coronary artery disease: Stable.  Aspirin on hold.  On beta-blockers.  Hypothyroidism: Stable.  On Synthroid.  Ischemic cardiomyopathy: Stable.  Euvolemic.  Will decrease IV fluids today as she is on clears.  A. fib with RVR: Developed rapid A. fib, needed Cardizem infusion currently weaned off.  Heart rate remains slightly elevated, seen by cardiology.  Was on every 6 hours metoprolol, changing to long-acting metoprolol today.  Will resume Eliquis on discharge.   DVT prophylaxis: SCDs Code Status: Full code Family Communication: None Disposition Plan: Cardiac telemetry.  Anticipate discharge home tomorrow if remains a stable.   Consultants:   Cardiology  Surgery  Gastroenterology  Procedures:   ERCP, biliary stenting, 04/14/2019  Antimicrobials:   Unasyn, 1 dose 04/14/2019  Zosyn, 04/14/2019-----   Subjective: Patient seen and examined.  No overnight events.  Wants to go home.  Denies any chest pain or shortness of breath.  She has not eaten anything yet.  No bowel movement. Objective: Vitals:   04/16/19 0449 04/16/19 0600 04/16/19 0735 04/16/19 1214  BP:   115/72 108/68  Pulse: 97  79 76  Resp:   18 18  Temp:   98 F (36.7 C) 98 F (36.7 C)  TempSrc:    Oral  SpO2:  99% 98% 96%  Weight:      Height:        Intake/Output Summary (Last 24 hours) at 04/16/2019 1355 Last data filed at 04/16/2019 0523 Gross per 24 hour  Intake 479.13 ml  Output 100 ml  Net 379.13 ml   Filed Weights   04/15/19 1843 04/15/19 1857 04/16/19 0427  Weight: 50.8 kg 50.8 kg 50.9 kg  Examination:  General exam: Appears calm and comfortable, on room air.  Less icteric today. Respiratory system: Clear to auscultation. Respiratory effort normal.  No added sounds. Cardiovascular system: S1 & S2 heard, irregularly irregular.  Pacemaker left precordium.  No JVD, murmurs, rubs, gallops or clicks. No pedal edema. Gastrointestinal system: Abdomen is  nondistended, soft and nontender. No organomegaly or masses felt. Normal bowel sounds heard.  No rigidity or guarding. Central nervous system: Alert and oriented. No focal neurological deficits. Extremities: Symmetric 5 x 5 power. Skin: No rashes, lesions or ulcers Psychiatry: Judgement and insight appear normal. Mood & affect appropriate.     Data Reviewed: I have personally reviewed following labs and imaging studies  CBC: Recent Labs  Lab 04/09/19 1625 04/11/19 1538 04/13/19 0349 04/14/19 0307 04/14/19 1430 04/15/19 0526 04/16/19 0609  WBC 9.4 9.3 7.7 6.9  --  11.2* 8.4  NEUTROABS 6,900  --  4.9 3.9  --  9.6* 5.5  HGB 13.1 12.3 10.6* 10.3* 11.9* 11.6* 9.8*  HCT 39.8 37.9 31.2* 30.3* 35.0* 35.6* 30.3*  MCV 87.9 90.2 86.4 85.6  --  89.4 89.9  PLT 454* 460* 367 326  --  358 [id number]   Basic Metabolic Panel: Recent Labs  Lab 04/13/19 0349 04/13/19 1625 04/14/19 0307 04/14/19 1430 04/15/19 0526 04/16/19 0609  NA 138 136 138 132* 135 136  K 2.4* 3.5 3.4* 3.9 4.6 3.7  CL 100 100 101  --  100 100  CO2 26 23 24   --  22 24  GLUCOSE 120* 147* 123*  --  128* 102*  BUN <5* 7* 8  --  11 12  CREATININE 0.58 0.66 0.63  --  0.70 0.74  CALCIUM 8.4* 8.7* 8.9  --  9.0 8.3*  MG  --   --   --   --   --  1.6*  PHOS  --   --   --   --   --  3.5   GFR: Estimated Creatinine Clearance: 43.6 mL/min (by C-G formula based on SCr of 0.74 mg/dL). Liver Function Tests: Recent Labs  Lab 04/09/19 1625 04/11/19 1538 04/12/19 0513 04/13/19 0349 04/15/19 0526 04/16/19 0609  AST 202* 211*  --  148* 132* 101*  ALT 133* 131*  --  102* 103* 76*  ALKPHOS  --  469*  --  379* 359* 263*  BILITOT 10.0* 9.5* 8.9* 8.2* 6.8* 4.7*  PROT 6.6 6.4*  --  5.7* 6.3* 5.1*  ALBUMIN  --  2.8*  --  2.4* 2.8* 2.2*   Recent Labs  Lab 04/15/19 0526 04/16/19 0609  LIPASE 27 33   No results for input(s): AMMONIA in the last 168 hours. Coagulation Profile: Recent Labs  Lab 04/09/19 1625 04/12/19 0800  04/13/19 0349  INR 1.2* 1.2 1.2   Cardiac Enzymes: No results for input(s): CKTOTAL, CKMB, CKMBINDEX, TROPONINI in the last 168 hours. BNP (last 3 results) No results for input(s): PROBNP in the last 8760 hours. HbA1C: No results for input(s): HGBA1C in the last 72 hours. CBG: Recent Labs  Lab 04/12/19 0811 04/13/19 0654 04/15/19 0809 04/16/19 0530  GLUCAP 93 102* 116* 102*   Lipid Profile: No results for input(s): CHOL, HDL, LDLCALC, TRIG, CHOLHDL, LDLDIRECT in the last 72 hours. Thyroid Function Tests: No results for input(s): TSH, T4TOTAL, FREET4, T3FREE, THYROIDAB in the last 72 hours. Anemia Panel: No results for input(s): VITAMINB12, FOLATE, FERRITIN, TIBC, IRON, RETICCTPCT in the last 72 hours. Sepsis Labs: Recent Labs  Lab 04/15/19  0526  LATICACIDVEN 1.3    Recent Results (from the past 240 hour(s))  SARS CORONAVIRUS 2 (TAT 6-24 HRS) Nasopharyngeal Nasopharyngeal Swab     Status: None   Collection Time: 04/12/19  5:41 AM   Specimen: Nasopharyngeal Swab  Result Value Ref Range Status   SARS Coronavirus 2 NEGATIVE NEGATIVE Final    Comment: (NOTE) SARS-CoV-2 target nucleic acids are NOT DETECTED. The SARS-CoV-2 RNA is generally detectable in upper and lower respiratory specimens during the acute phase of infection. Negative results do not preclude SARS-CoV-2 infection, do not rule out co-infections with other pathogens, and should not be used as the sole basis for treatment or other patient management decisions. Negative results must be combined with clinical observations, patient history, and epidemiological information. The expected result is Negative. Fact Sheet for Patients: SugarRoll.be Fact Sheet for Healthcare Providers: https://www.woods-mathews.com/ This test is not yet approved or cleared by the [place] FDA and  has been authorized for detection and/or diagnosis of SARS-CoV-2 by FDA under an Emergency  Use Authorization (EUA). This EUA will remain  in effect (meaning this test can be used) for the duration of the COVID-19 declaration under Section 56 4(b)(1) of the Act, 21 U.S.C. section 360bbb-3(b)(1), unless the authorization is terminated or revoked sooner. Performed at [facility], [place] [address] [place], [place] [address]          Radiology Studies: Dg Abd 1 View  Result Date: 04/14/2019 CLINICAL DATA:  NG tube placement EXAM: ABDOMEN - 1 VIEW COMPARISON:  04/14/2019 FINDINGS: The enteric tube projects over the gastric body. The bowel gas pattern is nonspecific and nonobstructive. A biliary stent is noted. IMPRESSION: Enteric tube projects over the gastric body. Electronically Signed   By: Constance Holster M.D.   On: 04/14/2019 19:07   Ct Abdomen Pelvis W Contrast  Result Date: 04/14/2019 CLINICAL DATA:  Perforation of the bile duct during ERCP with concern for retroperitoneal leak prior to placement a fully covered stents. No leak discovered post stent placement. EXAM: CT ABDOMEN AND PELVIS WITH CONTRAST TECHNIQUE: Multidetector CT imaging of the abdomen and pelvis was performed using the standard protocol following bolus administration of intravenous contrast. CONTRAST:  127mL OMNIPAQUE IOHEXOL 300 MG/ML  SOLN COMPARISON:  March 23, 2019 FINDINGS: Lower chest: No acute abnormality. Hepatobiliary: Pneumobilia is consistent with history of recent ERCP and stent placement. There is a stent in the common bile duct extending to the level of the duodenum. High attenuation material in the gallbladder is likely contrast. This is new since March 23, 2019. The common bile duct is patent. Hepatic steatosis is identified. No focal liver mass is noted. Pancreas: The pancreatic head is partially obscured by the common bile duct stent. No pancreatic masses noted. Spleen: Normal in size without focal abnormality. Adrenals/Urinary Tract: There is a cyst in the right kidney. The adrenal  glands, kidneys, ureters, and bladder are otherwise unremarkable. There is a Foley catheter in the bladder which is not completely compressed. Air in the bladder is likely from the Foley catheter. Stomach/Bowel: An NG tube terminates in the stomach. The stomach and small bowel are normal. The colon is normal. The appendix is not seen but there is no secondary evidence of appendicitis. Vascular/Lymphatic: Atherosclerotic changes are seen in the nonaneurysmal aorta. No adenopathy. Reproductive: Status post hysterectomy. No adnexal masses. Other: No free fluid is seen in the pelvis.  No free air. Musculoskeletal: No acute or significant osseous findings. IMPRESSION: 1. No free fluid in  the abdomen or pelvis to suggest bile leak. 2. Common bile duct stent as above. 3. Hepatic steatosis. 4. Atherosclerotic changes in the abdominal aorta. Electronically Signed   By: Dorise Bullion III M.D   On: 04/14/2019 15:40   Dg Abd 2 Views  Result Date: 04/15/2019 CLINICAL DATA:  Status post ERCP. EXAM: ABDOMEN - 2 VIEW COMPARISON:  Plain film of the abdomen from earlier same day. Plain film of the abdomen dated 04/14/2019. CT abdomen dated 04/14/2019. FINDINGS: Biliary stent appears stable in position/configuration. Bowel gas pattern is nonobstructive. No evidence of soft tissue mass or abnormal fluid collection. No evidence of free intraperitoneal air appreciated. No evidence of renal or ureteral calculi. Lung bases are clear. IMPRESSION: 1. Biliary stent appears stable in position/configuration. 2. Nonobstructive bowel gas pattern. No acute findings. Electronically Signed   By: Franki Cabot M.D.   On: 04/15/2019 15:15   Dg Abd Portable 1v  Result Date: 04/15/2019 CLINICAL DATA:  Biliary obstruction. EXAM: PORTABLE ABDOMEN - 1 VIEW COMPARISON:  Yesterday. FINDINGS: Paucity of intestinal gas with no dilated bowel loops seen. Oral contrast in normal appearing cecum and inferior right colon. Gas in normal caliber rectum.  Nasogastric tube tip in the mid stomach and side hole in the proximal stomach. Stable biliary stent. Mild lumbar and lower thoracic spine degenerative changes. Right hip degenerative changes. IMPRESSION: No acute abnormality. Electronically Signed   By: Claudie Revering M.D.   On: 04/15/2019 08:34   Dg Duanne Limerick W Single Cm (sol Or Thin Ba)  Result Date: 04/16/2019 CLINICAL DATA:  Patient underwent ERCP. A perforation is noted at the duodenum, distal bile duct, treated with stent placement. Current evaluation is to assess for leak. EXAM: WATER SOLUBLE UPPER GI SERIES: THIN BARIUM UTILIZED AFTER WATER-SOLUBLE CONTRAST TECHNIQUE: Single-column upper GI series was performed using water soluble contrast. CONTRAST:  Moderate-sized with contrast followed by thin barium. COMPARISON:  Prior exams. FLUOROSCOPY TIME:  Fluoroscopy Time:  2 minutes and 0 seconds. Radiation Exposure Index (if provided by the fluoroscopic device): 20 mGy. Number of Acquired Spot Images: 13 FINDINGS: Initial scout radiograph shows right upper quadrant biliary stent. Normal bowel gas pattern. Esophagus is normal course and in caliber.  No mucosal abnormality. Stomach is normal in overall size and configuration with no mucosal abnormality. Normal appearance of the duodenum with normal duodenal fold pattern. No contrast extravasation to indicate a residual leak. Contrast promptly impede from the stomach through the duodenum into the jejunum. IMPRESSION: 1. No evidence of a perforation/leak. 2. Negative exam. No masses, strictures or evidence of inflammation. Electronically Signed   By: Lajean Manes M.D.   On: 04/16/2019 09:49        Scheduled Meds:  chlorhexidine  15 mL Mouth Rinse BID   Chlorhexidine Gluconate Cloth  6 each Topical Daily   levothyroxine  50 mcg Oral QAC breakfast   mouth rinse  15 mL Mouth Rinse q12n4p   [START ON 04/17/2019] metoprolol succinate  200 mg Oral Daily   metoprolol tartrate  50 mg Oral Q6H   pantoprazole   40 mg Oral BID   sodium chloride flush  3 mL Intravenous Q12H   sodium chloride flush  3 mL Intravenous Q12H   Continuous Infusions:  sodium chloride Stopped (04/15/19 1857)   lactated ringers 100 mL/hr at 04/16/19 0523   piperacillin-tazobactam (ZOSYN)  IV 3.375 g (04/16/19 1315)     LOS: 4 days    Time spent: 30 minutes    Barb Merino, MD  [facility] Pager [phone]

## 2019-04-17 ENCOUNTER — Encounter (HOSPITAL_COMMUNITY): Payer: Self-pay | Admitting: Physician Assistant

## 2019-04-17 DIAGNOSIS — R748 Abnormal levels of other serum enzymes: Secondary | ICD-10-CM

## 2019-04-17 LAB — COMPREHENSIVE METABOLIC PANEL
ALT: 90 U/L — ABNORMAL HIGH (ref 0–44)
AST: 130 U/L — ABNORMAL HIGH (ref 15–41)
Albumin: 2.4 g/dL — ABNORMAL LOW (ref 3.5–5.0)
Alkaline Phosphatase: 281 U/L — ABNORMAL HIGH (ref 38–126)
Anion gap: 11 (ref 5–15)
BUN: 6 mg/dL — ABNORMAL LOW (ref 8–23)
CO2: 27 mmol/L (ref 22–32)
Calcium: 8.5 mg/dL — ABNORMAL LOW (ref 8.9–10.3)
Chloride: 101 mmol/L (ref 98–111)
Creatinine, Ser: 0.7 mg/dL (ref 0.44–1.00)
GFR calc Af Amer: >60 mL/min (ref >60)
GFR calc non Af Amer: >60 mL/min (ref >60)
Glucose, Bld: 96 mg/dL (ref 70–99)
Potassium: 3.3 mmol/L — ABNORMAL LOW (ref 3.5–5.1)
Sodium: 139 mmol/L (ref 135–145)
Total Bilirubin: 4.6 mg/dL — ABNORMAL HIGH (ref 0.3–1.2)
Total Protein: 5.5 g/dL — ABNORMAL LOW (ref 6.5–8.1)

## 2019-04-17 LAB — CBC WITH DIFFERENTIAL/PLATELET
Abs Immature Granulocytes: 0.05 10*3/uL (ref 0.00–0.07)
Basophils Absolute: 0 10*3/uL (ref 0.0–0.1)
Basophils Relative: 1 %
Eosinophils Absolute: 0.3 10*3/uL (ref 0.0–0.5)
Eosinophils Relative: 4 %
HCT: 32.4 % — ABNORMAL LOW (ref 36.0–46.0)
Hemoglobin: 10.6 g/dL — ABNORMAL LOW (ref 12.0–15.0)
Immature Granulocytes: 1 %
Lymphocytes Relative: 31 %
Lymphs Abs: 2.1 10*3/uL (ref 0.7–4.0)
MCH: 29.4 pg (ref 26.0–34.0)
MCHC: 32.7 g/dL (ref 30.0–36.0)
MCV: 90 fL (ref 80.0–100.0)
Monocytes Absolute: 0.8 10*3/uL (ref 0.1–1.0)
Monocytes Relative: 11 %
Neutro Abs: 3.5 10*3/uL (ref 1.7–7.7)
Neutrophils Relative %: 52 %
Platelets: 324 10*3/uL (ref 150–400)
RBC: 3.6 MIL/uL — ABNORMAL LOW (ref 3.87–5.11)
RDW: 15.6 % — ABNORMAL HIGH (ref 11.5–15.5)
WBC: 6.7 10*3/uL (ref 4.0–10.5)
nRBC: 0 % (ref 0.0–0.2)

## 2019-04-17 LAB — GLUCOSE, CAPILLARY: Glucose-Capillary: 85 mg/dL (ref 70–99)

## 2019-04-17 MED ORDER — APIXABAN 2.5 MG PO TABS
2.5000 mg | ORAL_TABLET | Freq: Two times a day (BID) | ORAL | Status: DC
  Administered 2019-04-17 – 2019-04-18 (×2): 2.5 mg via ORAL
  Filled 2019-04-17 (×3): qty 1

## 2019-04-17 MED ORDER — POTASSIUM CHLORIDE CRYS ER 20 MEQ PO TBCR
40.0000 meq | EXTENDED_RELEASE_TABLET | Freq: Once | ORAL | Status: AC
  Administered 2019-04-17: 40 meq via ORAL
  Filled 2019-04-17: qty 2

## 2019-04-17 NOTE — Progress Notes (Signed)
ANTICOAGULATION CONSULT NOTE - Initial Consult  Pharmacy Consult for Eliquis Indication: atrial fibrillation  No Known Allergies  Patient Measurements: Height: 5\' 6"  (167.6 cm) Weight: 112 lb 3.1 oz (50.9 kg)(scale a) IBW/kg (Calculated) : 59.3  Vital Signs: Temp: 97.8 F (36.6 C) (11/03 1159) Temp Source: Oral (11/03 1159) BP: 117/70 (11/03 1159) Pulse Rate: 72 (11/03 1159)  Labs: Recent Labs    04/15/19 0526 04/16/19 0609 04/17/19 0606  HGB 11.6* 9.8* 10.6*  HCT 35.6* 30.3* 32.4*  PLT 358 327 324  CREATININE 0.70 0.74 0.70    Estimated Creatinine Clearance: 43.6 mL/min (by C-G formula based on SCr of 0.7 mg/dL).   Medical History: Past Medical History:  Diagnosis Date  . Anxiety   . CAD (coronary artery disease)   . CKD (chronic kidney disease)   . Gastro-esophageal reflux disease without esophagitis   . Hypothyroidism   . Ischemic cardiomyopathy   . Jaundice 03/2019  . Mild dilation of ascending aorta (HCC)   . Moderate protein-calorie malnutrition ([place])   . Permanent atrial fibrillation ([place])   . Severe episode of recurrent major depressive disorder, without psychotic features ([place])   . Symptomatic bradycardia     Assessment:  CC/HPI: jaundice, slightly elevated bilirubin and dilated common bile duct. Underwent EGD, ERCP on 04/14/2019 where she was found to have perforation of CBD and duodenum, treated with biliary stent with good results.  PMH: CAD, CKD, afib, CABG, brady with PPM, HF  Significant events:  recent hospitalization due to GI bleed patient was on Coumadin until last month and with 2 episodes of lower GI bleeding, she has come off the Coumadin.   Anticoag: Previously on Coumadin (one month ago) for afib (only [id number]), now w/ concern for GI bleed. S/p GI procedure and cleared to start anticoagulation. CHADS2VASC 5. Scr WNL, Hgb 10.6. Plts 324  Goal of Therapy:  Therapeutic oral anticoagulation   Plan:  Eliquis 2.5mg  BID (age >41,  weight 50kg)   Paul Trettin S. Alford Highland, PharmD, [place] Clinical Staff Pharmacist Eilene Ghazi Stillinger 04/17/2019,2:48 PM

## 2019-04-17 NOTE — Progress Notes (Signed)
[facility] Progress Note  CC:  I am fine, let me go home  Assessment / Plan: Iatrogenic perforation of distal CBD/ampulla. Symptomatic choledocholithiasis S/P EUS/ERCP sphincterotomy and sphincteroplasty and placement of pancreatic stent complicated by a  perforation of the bile duct and duodenum. A covered metal biliary stent was placed. No leak or perforation seen on CT scan 04/14/19 or UGI 04/16/19.  Bilirubin continues to decline.  Other liver enzymes have somewhat stalled.   - Tolerating clear liquid diet.  - Advance diet today.   - Continue Zosyn. Complete 10 days of antibiotics given her high risk for cholecystitis.  - Await gastric biopsies from EGD 04/14/19.  - Follow daily liver enzymes. Consider repeat abdominal imaging if they worsen.   - KUB as an outpatient in 2 weeks.   - Gallbladder stent to be pulled in 6-8 weeks.  - Cholecystectomy per surgery.  - Outpatient follow-up with Dr. Rush Landmark 05/01/19   Normocytic anemia. Hemglobin 10.6 today. No overt bleeding.  EGD 03/26/2019 by Dr. 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 Colonoscopy on the same date showed diverticulosis to the descending and sigmoid colon, most likely diverticular bleed. No current signs of active GI bleeding.   - follow serial hgb/hct with transfusion if needed.  - okay to resume anticoagulation.   - continue IV PPI BID  Subjective: Tolerating clear liquid diet without complaints. No GI complaints today. Nurse at the bedside. No family present. Doesn't understand why she can't go home when she feels "fine."   Objective:  Vital signs in last 24 hours: Temp:  [97.6 F (36.4 C)-98.2 F (36.8 C)] 97.6 F (36.4 C) (11/03 0623) Pulse Rate:  [76-94] 94 (11/03 0623) Resp:  [16-18] 16 (11/03 0623) BP: (108-145)/(67-84) 145/67 (11/03 0623) SpO2:  [96 %-100 %] 100 % (11/03 0623) Weight:  [50.9 kg] 50.9 kg (11/03 0623) Last BM Date: 04/16/19 General:   Alert, in NAD.  Scleral icterus present.  Heart:  Irregular rhythm; no murmurs Pulm: Clear anteriorly; no wheezing Abdomen:  Soft. Nontender. Nondistended. Hypoactive bowel sounds. No rebound or guarding. LAD: No inguinal or umbilical LAD Extremities:  Without edema. Neurologic:  Alert and  oriented x4;  grossly normal neurologically. Psych:  Alert and cooperative. Normal mood and affect.  Lab Results: Recent Labs    04/15/19 0526 04/16/19 0609 04/17/19 0606  WBC 11.2* 8.4 6.7  HGB 11.6* 9.8* 10.6*  HCT 35.6* 30.3* 32.4*  PLT 358 327 324   BMET Recent Labs    04/15/19 0526 04/16/19 0609 04/17/19 0606  NA 135 136 139  K 4.6 3.7 3.3*  CL 100 100 101  CO2 22 24 27   GLUCOSE 128* 102* 96  BUN 11 12 6*  CREATININE 0.70 0.74 0.70  CALCIUM 9.0 8.3* 8.5*   LFT Recent Labs    04/17/19 0606  PROT 5.5*  ALBUMIN 2.4*  AST 130*  ALT 90*  ALKPHOS 281*  BILITOT 4.6*    WATER SOLUBLE UPPER GI SERIES: THIN BARIUM UTILIZED AFTER WATER-SOLUBLE CONTRAST  TECHNIQUE: Single-column upper GI series was performed using water soluble contrast.  CONTRAST:  Moderate-sized with contrast followed by thin barium.  COMPARISON:  Prior exams.  FLUOROSCOPY TIME:  Fluoroscopy Time:  2 minutes and 0 seconds.  Radiation Exposure Index (if provided by the fluoroscopic device): 20 mGy.  Number of Acquired Spot Images: 13  FINDINGS: Initial scout radiograph shows right upper quadrant biliary stent. Normal bowel gas pattern.  Esophagus is normal course and  in caliber.  No mucosal abnormality.  Stomach is normal in overall size and configuration with no mucosal abnormality.  Normal appearance of the duodenum with normal duodenal fold pattern.  No contrast extravasation to indicate a residual leak.  Contrast promptly impede from the stomach through the duodenum into the jejunum.  IMPRESSION: 1. No evidence of a perforation/leak. 2. Negative exam. No masses, strictures or evidence  of inflammation.    LOS: 5 days   Thornton Park  04/17/2019, 8:31 AM

## 2019-04-17 NOTE — Progress Notes (Signed)
Pt tolerated regular diet. No c/o pain or nausea.

## 2019-04-17 NOTE — Progress Notes (Signed)
Progress Note  Patient Name: Angelica Hernandez Date of Encounter: 04/17/2019  Primary Cardiologist: Kate Sable, MD   Subjective   IM told her D/C today. No chest pain or SOB. However, has been having diarrhea today.   Inpatient Medications    Scheduled Meds:  chlorhexidine  15 mL Mouth Rinse BID   Chlorhexidine Gluconate Cloth  6 each Topical Daily   levothyroxine  50 mcg Oral QAC breakfast   mouth rinse  15 mL Mouth Rinse q12n4p   metoprolol succinate  200 mg Oral Daily   pantoprazole  40 mg Oral BID   sodium chloride flush  3 mL Intravenous Q12H   sodium chloride flush  3 mL Intravenous Q12H   Continuous Infusions:  sodium chloride Stopped (04/15/19 1857)   piperacillin-tazobactam (ZOSYN)  IV 3.375 g (04/17/19 0618)   PRN Meds: sodium chloride, fentaNYL (SUBLIMAZE) injection, ondansetron **OR** ondansetron (ZOFRAN) IV, sodium chloride flush   Vital Signs    Vitals:   04/16/19 1214 04/16/19 2103 04/17/19 0006 04/17/19 0623  BP: 108/68 116/68 115/84 (!) 145/67  Pulse: 76 82  94  Resp: 18 16 17 16   Temp: 98 F (36.7 C) 98.2 F (36.8 C)  97.6 F (36.4 C)  TempSrc: Oral Oral  Oral  SpO2: 96% 96%  100%  Weight:    50.9 kg  Height:        Intake/Output Summary (Last 24 hours) at 04/17/2019 1013 Last data filed at 04/17/2019 0842 Gross per 24 hour  Intake 680 ml  Output 750 ml  Net -70 ml   Last 3 Weights 04/17/2019 04/16/2019 04/15/2019  Weight (lbs) 112 lb 3.1 oz 112 lb 4.8 oz 112 lb  Weight (kg) 50.89 kg 50.939 kg 50.803 kg      Telemetry    Afib, spikes this am up to 160, improves quickly  - Personally Reviewed  ECG    N/a - Personally Reviewed  Physical Exam   VS:  BP (!) 145/67 (BP Location: Right Arm)    Pulse 94    Temp 97.6 F (36.4 C) (Oral)    Resp 16    Ht 5\' 6"  (1.676 m)    Wt 50.9 kg Comment: scale a   SpO2 100%    BMI 18.11 kg/m  , BMI Body mass index is 18.11 kg/m.  General: frail, elderly, female in no acute  distress Head: Eyes PERRLA, Head normocephalic and atraumatic Lungs: clear bilaterally to auscultation. Heart:  Irreg R&R, S1 S2, without rub or gallop. 2/6 murmur. 4/4 extremity pulses are 2+ & equal. No JVD. Abdomen: Bowel sounds are present, abdomen soft and non-tender without masses or  hernias noted. Msk: Normal strength and tone for age. Extremities: No clubbing, cyanosis or edema.    Skin:  No rashes or lesions noted. Neuro: Alert and oriented X 3. Psych:  Good affect, responds appropriately   Labs    High Sensitivity Troponin:   Recent Labs  Lab 03/22/19 1959  TROPONINIHS 8      Chemistry Recent Labs  Lab 04/15/19 0526 04/16/19 0609 04/17/19 0606  NA 135 136 139  K 4.6 3.7 3.3*  CL 100 100 101  CO2 22 24 27   GLUCOSE 128* 102* 96  BUN 11 12 6*  CREATININE 0.70 0.74 0.70  CALCIUM 9.0 8.3* 8.5*  PROT 6.3* 5.1* 5.5*  ALBUMIN 2.8* 2.2* 2.4*  AST 132* 101* 130*  ALT 103* 76* 90*  ALKPHOS 359* 263* 281*  BILITOT 6.8* 4.7* 4.6*  GFRNONAA >  60 >60 >60  GFRAA >60 >60 >60  ANIONGAP 13 12 11      Hematology Recent Labs  Lab 04/15/19 0526 04/16/19 0609 04/17/19 0606  WBC 11.2* 8.4 6.7  RBC 3.98 3.37* 3.60*  HGB 11.6* 9.8* 10.6*  HCT 35.6* 30.3* 32.4*  MCV 89.4 89.9 90.0  MCH 29.1 29.1 29.4  MCHC 32.6 32.3 32.7  RDW 15.5 15.8* 15.6*  PLT 358 327 324    Radiology    Dg Abd 2 Views  Result Date: 04/15/2019 CLINICAL DATA:  Status post ERCP. EXAM: ABDOMEN - 2 VIEW COMPARISON:  Plain film of the abdomen from earlier same day. Plain film of the abdomen dated 04/14/2019. CT abdomen dated 04/14/2019. FINDINGS: Biliary stent appears stable in position/configuration. Bowel gas pattern is nonobstructive. No evidence of soft tissue mass or abnormal fluid collection. No evidence of free intraperitoneal air appreciated. No evidence of renal or ureteral calculi. Lung bases are clear. IMPRESSION: 1. Biliary stent appears stable in position/configuration. 2. Nonobstructive  bowel gas pattern. No acute findings. Electronically Signed   By: Franki Cabot M.D.   On: 04/15/2019 15:15   Dg Duanne Limerick W Single Cm (sol Or Thin Ba)  Result Date: 04/16/2019 CLINICAL DATA:  Patient underwent ERCP. A perforation is noted at the duodenum, distal bile duct, treated with stent placement. Current evaluation is to assess for leak. EXAM: WATER SOLUBLE UPPER GI SERIES: THIN BARIUM UTILIZED AFTER WATER-SOLUBLE CONTRAST TECHNIQUE: Single-column upper GI series was performed using water soluble contrast. CONTRAST:  Moderate-sized with contrast followed by thin barium. COMPARISON:  Prior exams. FLUOROSCOPY TIME:  Fluoroscopy Time:  2 minutes and 0 seconds. Radiation Exposure Index (if provided by the fluoroscopic device): 20 mGy. Number of Acquired Spot Images: 13 FINDINGS: Initial scout radiograph shows right upper quadrant biliary stent. Normal bowel gas pattern. Esophagus is normal course and in caliber.  No mucosal abnormality. Stomach is normal in overall size and configuration with no mucosal abnormality. Normal appearance of the duodenum with normal duodenal fold pattern. No contrast extravasation to indicate a residual leak. Contrast promptly impede from the stomach through the duodenum into the jejunum. IMPRESSION: 1. No evidence of a perforation/leak. 2. Negative exam. No masses, strictures or evidence of inflammation. Electronically Signed   By: Lajean Manes M.D.   On: 04/16/2019 09:49    Cardiac Studies   Echo 02/22/18: Study Conclusions  - Left ventricle: The cavity size was normal. Wall thickness was   normal. Systolic function was mildly reduced. The estimated   ejection fraction was in the range of 45% to 50%. Diffuse   hypokinesis. Abnormal global longitudinal strain of -12.2%. The   study was not technically sufficient to allow evaluation of LV   diastolic dysfunction due to atrial fibrillation. - Aortic valve: Mildly calcified annulus. Trileaflet. - Mitral valve: Mildly  calcified annulus. There was mild   regurgitation. - Right ventricle: Pacer wire or catheter noted in right ventricle. - Right atrium: Pacer wire or catheter noted in right atrium. - Atrial septum: No defect or patent foramen ovale was identified. - Tricuspid valve: There was moderate regurgitation. - Pulmonary arteries: PA peak pressure: 35 mm Hg (S). - Pericardium, extracardiac: There was no pericardial effusion.  Patient Profile     85 y.o. female with CAD status post CABG in the 1980s, sick sinus syndrome status post pacemaker, permanent atrial fibrillation, admitted 10/29 with jaundice and atrial fibrillation with RVR after ERCP.  Assessment & Plan    # PAF:  - Afib  rate ok till early this am, then higher w/ spikes to 160s (brief) - got Toprol XL 200 mg this am, follow HR - no pacing seen - at office visit 10/16, pt wanted to come off warfarin after LGIB 2nd ?diverticuli and/or AVMs>>started on ASA 325 mg>>held on admit - MD advise on resuming ASA, she was not on Eliquis  # CAD:  - Hx CABG 1970s - on BB - home Lipitor and ASA held on admit - LFTs are still abnl, continue to hold statin  # Hypertension:  - home Dilt & ACE on hold, may be able to resume at f/u appt, hold at d/c - BB restarted and increased for HR/BP control>>continue this dose - SBP 110s-145 last 24 hr, f/u as outpt  #GI issues - s/p ERCP w/ stenting  - duodenal ulcers seen on EUS - pt has diarrhea today, asked her to let GI/IM doctors know.     For questions or updates, please contact [facility] Please consult [email or web address] for contact info under        Signed, Rosaria Ferries, PA-C  04/17/2019, 10:13 AM

## 2019-04-17 NOTE — Care Management Important Message (Signed)
Important Message  Patient Details  Name: Angelica Hernandez MRN: [id number] Date of Birth: [birth year]   Medicare Important Message Given:  Yes     [name] 04/17/2019, 3:10 PM

## 2019-04-17 NOTE — Discharge Instructions (Addendum)

## 2019-04-17 NOTE — Progress Notes (Signed)
PROGRESS NOTE    Ceylin Stieglitz  [id number] DOB: 09-15-1933 DOA: 04/11/2019 PCP: Caryl Bis, MD    Brief Narrative:  Patient with multiple chronic issues including coronary artery disease, CABG, symptomatic bradycardia with pacemaker, chronic systolic heart failure recent hospitalization due to GI bleed, was found with slightly elevated bilirubin and dilated common bile duct.  Plan were for outpatient EUS, repeat bilirubin were elevated and was brought to emergency room.  She had generalized weakness and yellowness.  No other particular features.  Patient was in the hospital, developed A. fib with RVR.  Underwent EGD, ERCP on 04/14/2019 where she was found to have perforation of CBD and duodenum, treated with biliary stent with good results.  Subsequent CT scan did not show any free air.  Was admitted to ICU for monitoring and transferred out.    Assessment & Plan:   Principal Problem:   Painless jaundice Active Problems:   Atrial fibrillation (HCC)   CAD (coronary artery disease)   Hypothyroidism   DCM (dilated cardiomyopathy) (HCC)  Biliary ductal dilatation/choledocholithiasis with obstructive jaundice: Status post ERCP and biliary stenting. Clinically improving.  Lipase is normal.  LFTs and bilirubin continues to improve.  Biopsies pending.    CBD perforation with concern of bile leak, contained duodenal perforation, underwent metal stenting with good results.  Clinically without evidence of peritonitis.  CT scans and repeat barium follow through with no pneumoperitoneum.  Seen by surgery.  Conservative management. On clear liquid diet. On PPI. IV Zosyn.  Plan for 10 days of antibiotic therapy.  Can be oral. Outpatient cholecystectomy planned in 3 to 4 weeks as per surgery.  Coronary artery disease: Stable.  Was on aspirin.  Currently on hold.  On beta-blockers.  Hypothyroidism: Stable.  On Synthroid.  Ischemic cardiomyopathy: Stable.  Euvolemic.  Discontinue IV  fluids.  A. fib with RVR: Developed rapid A. fib, needed Cardizem infusion currently weaned off.  Heart rate remains intermittently elevated.  seen by cardiology.  Currently on metoprolol succinate.  As per cardiology. As per reports, patient was on Coumadin until last month and with 2 episodes of lower GI bleeding, she has come off the Coumadin. Will resume aspirin on discharge.  Diarrhea: Multiple episodes probably due to barium ingestion .  Monitoring.  Imodium as needed.  Advance activities.  Diet as per GI.  Discharge planning as per GI clearance.   DVT prophylaxis: SCDs Code Status: Full code Family Communication: Patient's daughter called and notified. Disposition Plan: Cardiac telemetry.  Anticipate discharge home tomorrow if remains stable.   Consultants:   Cardiology  Surgery  Gastroenterology  Procedures:   ERCP, biliary stenting, 04/14/2019  Antimicrobials:   Unasyn, 1 dose 04/14/2019  Zosyn, 04/14/2019-----   Subjective: Patient seen and examined.  Had multiple episodes of diarrhea.  Telemetry shows occasional heart rate as high as 170.  Sinus rhythm. Objective: Vitals:   04/16/19 2103 04/17/19 0006 04/17/19 0623 04/17/19 1159  BP: 116/68 115/84 (!) 145/67 117/70  Pulse: 82  94 72  Resp: 16 17 16 20   Temp: 98.2 F (36.8 C)  97.6 F (36.4 C) 97.8 F (36.6 C)  TempSrc: Oral  Oral Oral  SpO2: 96%  100% 96%  Weight:   50.9 kg   Height:        Intake/Output Summary (Last 24 hours) at 04/17/2019 1327 Last data filed at 04/17/2019 1315 Gross per 24 hour  Intake 800 ml  Output 1250 ml  Net -450 ml   [employer]  04/15/19 1857 04/16/19 0427 04/17/19 0623  Weight: 50.8 kg 50.9 kg 50.9 kg    Examination:  General exam: Appears calm and comfortable, on room air.  Respiratory system: Clear to auscultation. Respiratory effort normal.  No added sounds. Cardiovascular system: S1 & S2 heard, regular. Pacemaker left precordium.  No JVD, murmurs, rubs,  gallops or clicks. No pedal edema. Gastrointestinal system: Abdomen is nondistended, soft and nontender. No organomegaly or masses felt. Normal bowel sounds heard.  No rigidity or guarding. Central nervous system: Alert and oriented. No focal neurological deficits. Extremities: Symmetric 5 x 5 power. Skin: No rashes, lesions or ulcers Psychiatry: Judgement and insight appear normal. Mood & affect appropriate.     Data Reviewed: I have personally reviewed following labs and imaging studies  CBC: Recent Labs  Lab 04/13/19 0349 04/14/19 0307 04/14/19 1430 04/15/19 0526 04/16/19 0609 04/17/19 0606  WBC 7.7 6.9  --  11.2* 8.4 6.7  NEUTROABS 4.9 3.9  --  9.6* 5.5 3.5  HGB 10.6* 10.3* 11.9* 11.6* 9.8* 10.6*  HCT 31.2* 30.3* 35.0* 35.6* 30.3* 32.4*  MCV 86.4 85.6  --  89.4 89.9 90.0  PLT 367 326  --  358 327 0000000   Basic Metabolic Panel: Recent Labs  Lab 04/13/19 1625 04/14/19 0307 04/14/19 1430 04/15/19 0526 04/16/19 0609 04/17/19 0606  NA 136 138 132* 135 136 139  K 3.5 3.4* 3.9 4.6 3.7 3.3*  CL 100 101  --  100 100 101  CO2 23 24  --  22 24 27   GLUCOSE 147* 123*  --  128* 102* 96  BUN 7* 8  --  11 12 6*  CREATININE 0.66 0.63  --  0.70 0.74 0.70  CALCIUM 8.7* 8.9  --  9.0 8.3* 8.5*  MG  --   --   --   --  1.6*  --   PHOS  --   --   --   --  3.5  --    GFR: Estimated Creatinine Clearance: 43.6 mL/min (by C-G formula based on SCr of 0.7 mg/dL). Liver Function Tests: Recent Labs  Lab 04/11/19 1538 04/12/19 0513 04/13/19 0349 04/15/19 0526 04/16/19 0609 04/17/19 0606  AST 211*  --  148* 132* 101* 130*  ALT 131*  --  102* 103* 76* 90*  ALKPHOS 469*  --  379* 359* 263* 281*  BILITOT 9.5* 8.9* 8.2* 6.8* 4.7* 4.6*  PROT 6.4*  --  5.7* 6.3* 5.1* 5.5*  ALBUMIN 2.8*  --  2.4* 2.8* 2.2* 2.4*   Recent Labs  Lab 04/15/19 0526 04/16/19 0609  LIPASE 27 33   No results for input(s): AMMONIA in the last 168 hours. Coagulation Profile: Recent Labs  Lab 04/12/19 0800  04/13/19 0349  INR 1.2 1.2   Cardiac Enzymes: No results for input(s): CKTOTAL, CKMB, CKMBINDEX, TROPONINI in the last 168 hours. BNP (last 3 results) No results for input(s): PROBNP in the last 8760 hours. HbA1C: No results for input(s): HGBA1C in the last 72 hours. CBG: Recent Labs  Lab 04/12/19 0811 04/13/19 0654 04/15/19 0809 04/16/19 0530 04/17/19 0620  GLUCAP 93 102* 116* 102* 85   Lipid Profile: No results for input(s): CHOL, HDL, LDLCALC, TRIG, CHOLHDL, LDLDIRECT in the last 72 hours. Thyroid Function Tests: No results for input(s): TSH, T4TOTAL, FREET4, T3FREE, THYROIDAB in the last 72 hours. Anemia Panel: No results for input(s): VITAMINB12, FOLATE, FERRITIN, TIBC, IRON, RETICCTPCT in the last 72 hours. Sepsis Labs: Recent Labs  Lab 04/15/19 [phone]  LATICACIDVEN 1.3    Recent Results (from the past 240 hour(s))  SARS CORONAVIRUS 2 (TAT 6-24 HRS) Nasopharyngeal Nasopharyngeal Swab     Status: None   Collection Time: 04/12/19  5:41 AM   Specimen: Nasopharyngeal Swab  Result Value Ref Range Status   SARS Coronavirus 2 NEGATIVE NEGATIVE Final    Comment: (NOTE) SARS-CoV-2 target nucleic acids are NOT DETECTED. The SARS-CoV-2 RNA is generally detectable in upper and lower respiratory specimens during the acute phase of infection. Negative results do not preclude SARS-CoV-2 infection, do not rule out co-infections with other pathogens, and should not be used as the sole basis for treatment or other patient management decisions. Negative results must be combined with clinical observations, patient history, and epidemiological information. The expected result is Negative. Fact Sheet for Patients: SugarRoll.be Fact Sheet for Healthcare Providers: https://www.woods-mathews.com/ This test is not yet approved or cleared by the [place] FDA and  has been authorized for detection and/or diagnosis of SARS-CoV-2 by FDA under  an Emergency Use Authorization (EUA). This EUA will remain  in effect (meaning this test can be used) for the duration of the COVID-19 declaration under Section 56 4(b)(1) of the Act, 21 U.S.C. section 360bbb-3(b)(1), unless the authorization is terminated or revoked sooner. Performed at [facility], [place] [address], [place], [place] [address]          Radiology Studies: Dg Paulene Floor Single Cm (sol Or Thin Ba)  Result Date: 04/16/2019 CLINICAL DATA:  Patient underwent ERCP. A perforation is noted at the duodenum, distal bile duct, treated with stent placement. Current evaluation is to assess for leak. EXAM: WATER SOLUBLE UPPER GI SERIES: THIN BARIUM UTILIZED AFTER WATER-SOLUBLE CONTRAST TECHNIQUE: Single-column upper GI series was performed using water soluble contrast. CONTRAST:  Moderate-sized with contrast followed by thin barium. COMPARISON:  Prior exams. FLUOROSCOPY TIME:  Fluoroscopy Time:  2 minutes and 0 seconds. Radiation Exposure Index (if provided by the fluoroscopic device): 20 mGy. Number of Acquired Spot Images: 13 FINDINGS: Initial scout radiograph shows right upper quadrant biliary stent. Normal bowel gas pattern. Esophagus is normal course and in caliber.  No mucosal abnormality. Stomach is normal in overall size and configuration with no mucosal abnormality. Normal appearance of the duodenum with normal duodenal fold pattern. No contrast extravasation to indicate a residual leak. Contrast promptly impede from the stomach through the duodenum into the jejunum. IMPRESSION: 1. No evidence of a perforation/leak. 2. Negative exam. No masses, strictures or evidence of inflammation. Electronically Signed   By: Lajean Manes M.D.   On: 04/16/2019 09:49        Scheduled Meds: . chlorhexidine  15 mL Mouth Rinse BID  . Chlorhexidine Gluconate Cloth  6 each Topical Daily  . levothyroxine  50 mcg Oral QAC breakfast  . mouth rinse  15 mL Mouth Rinse q12n4p  . metoprolol succinate   200 mg Oral Daily  . pantoprazole  40 mg Oral BID  . sodium chloride flush  3 mL Intravenous Q12H  . sodium chloride flush  3 mL Intravenous Q12H   Continuous Infusions: . sodium chloride Stopped (04/15/19 1857)  . piperacillin-tazobactam (ZOSYN)  IV 3.375 g (04/17/19 0618)     LOS: 5 days    Time spent: 30 minutes    Barb Merino, MD [facility] Pager [phone]

## 2019-04-17 NOTE — Progress Notes (Signed)
Report received from previous RN. Patient resting and has antibiotic running.

## 2019-04-17 NOTE — Progress Notes (Signed)
Transitions of Care Pharmacist Note  Rikia Trinkle is a 85 y.o. female that has been diagnosed with A Fib and will be prescribed Eliquis (apixaban) at discharge.   Patient Education: I provided the following education on 04/17/2019 to the patient: How to take the medication Described what the medication is Signs of bleeding Answered their questions  Discharge Medications Plan: The patient wants to have their discharge medications filled by the Transitions of Care pharmacy rather than their usual pharmacy.  The discharge orders pharmacy has been changed to the Transitions of Care pharmacy, the patient will receive a phone call regarding co-pay, and their medications will be delivered by the Transitions of Care pharmacy.    Thank you,   Lorel Monaco, PharmD PGY1 Ambulatory Care Resident [facility] # [phone]  April 17, 2019

## 2019-04-18 ENCOUNTER — Telehealth: Payer: Self-pay

## 2019-04-18 DIAGNOSIS — I959 Hypotension, unspecified: Secondary | ICD-10-CM | POA: Insufficient documentation

## 2019-04-18 LAB — CBC WITH DIFFERENTIAL/PLATELET
Abs Immature Granulocytes: 0.03 10*3/uL (ref 0.00–0.07)
Basophils Absolute: 0.1 10*3/uL (ref 0.0–0.1)
Basophils Relative: 1 %
Eosinophils Absolute: 0.3 10*3/uL (ref 0.0–0.5)
Eosinophils Relative: 5 %
HCT: 31.1 % — ABNORMAL LOW (ref 36.0–46.0)
Hemoglobin: 10.2 g/dL — ABNORMAL LOW (ref 12.0–15.0)
Immature Granulocytes: 1 %
Lymphocytes Relative: 32 %
Lymphs Abs: 2.1 10*3/uL (ref 0.7–4.0)
MCH: 29.2 pg (ref 26.0–34.0)
MCHC: 32.8 g/dL (ref 30.0–36.0)
MCV: 89.1 fL (ref 80.0–100.0)
Monocytes Absolute: 0.7 10*3/uL (ref 0.1–1.0)
Monocytes Relative: 11 %
Neutro Abs: 3.2 10*3/uL (ref 1.7–7.7)
Neutrophils Relative %: 50 %
Platelets: 303 10*3/uL (ref 150–400)
RBC: 3.49 MIL/uL — ABNORMAL LOW (ref 3.87–5.11)
RDW: 15.6 % — ABNORMAL HIGH (ref 11.5–15.5)
WBC: 6.4 10*3/uL (ref 4.0–10.5)
nRBC: 0 % (ref 0.0–0.2)

## 2019-04-18 LAB — COMPREHENSIVE METABOLIC PANEL
ALT: 93 U/L — ABNORMAL HIGH (ref 0–44)
AST: 133 U/L — ABNORMAL HIGH (ref 15–41)
Albumin: 2.4 g/dL — ABNORMAL LOW (ref 3.5–5.0)
Alkaline Phosphatase: 287 U/L — ABNORMAL HIGH (ref 38–126)
Anion gap: 9 (ref 5–15)
BUN: <5 mg/dL — ABNORMAL LOW (ref 8–23)
CO2: 24 mmol/L (ref 22–32)
Calcium: 8.8 mg/dL — ABNORMAL LOW (ref 8.9–10.3)
Chloride: 107 mmol/L (ref 98–111)
Creatinine, Ser: 0.67 mg/dL (ref 0.44–1.00)
GFR calc Af Amer: >60 mL/min (ref >60)
GFR calc non Af Amer: >60 mL/min (ref >60)
Glucose, Bld: 113 mg/dL — ABNORMAL HIGH (ref 70–99)
Potassium: 4.1 mmol/L (ref 3.5–5.1)
Sodium: 140 mmol/L (ref 135–145)
Total Bilirubin: 3.4 mg/dL — ABNORMAL HIGH (ref 0.3–1.2)
Total Protein: 5.3 g/dL — ABNORMAL LOW (ref 6.5–8.1)

## 2019-04-18 LAB — GLUCOSE, CAPILLARY: Glucose-Capillary: 124 mg/dL — ABNORMAL HIGH (ref 70–99)

## 2019-04-18 LAB — SURGICAL PATHOLOGY

## 2019-04-18 MED ORDER — METOPROLOL SUCCINATE ER 200 MG PO TB24: 200.0000 mg | ORAL_TABLET | Freq: Every day | ORAL | 0 refills | Status: DC

## 2019-04-18 MED ORDER — PANTOPRAZOLE SODIUM 40 MG PO TBEC: 40.0000 mg | DELAYED_RELEASE_TABLET | Freq: Two times a day (BID) | ORAL | 1 refills | Status: DC

## 2019-04-18 MED ORDER — APIXABAN 2.5 MG PO TABS: 2.5000 mg | ORAL_TABLET | Freq: Two times a day (BID) | ORAL | 3 refills | Status: DC

## 2019-04-18 MED ORDER — AMOXICILLIN-POT CLAVULANATE 875-125 MG PO TABS: 1.0000 | ORAL_TABLET | Freq: Two times a day (BID) | ORAL | 0 refills | Status: AC

## 2019-04-18 MED FILL — METOPROLOL SUCCINATE ER 100: 100 | 30 days supply | Qty: 60 | Fill #0

## 2019-04-18 MED FILL — PANTOPRAZOLE SOD DR 40 MG T: 40 | 15 days supply | Qty: 30 | Fill #0

## 2019-04-18 MED FILL — AMOX-CLAV 875-125 MG TABLET: 875-125 | 7 days supply | Qty: 14 | Fill #0

## 2019-04-18 MED FILL — ELIQUIS 2.5 MG TABLET: 2.5 | 30 days supply | Qty: 60 | Fill #0

## 2019-04-18 NOTE — Plan of Care (Signed)

## 2019-04-18 NOTE — Telephone Encounter (Signed)
Patient still hospitalized

## 2019-04-18 NOTE — Discharge Summary (Signed)
Physician Discharge Summary  Angelica Hernandez [id number] DOB: 05-Jun-1934 DOA: 04/11/2019  PCP: Caryl Bis, MD  Admit date: 04/11/2019 Discharge date: 04/18/2019  Admitted From: Home Disposition: Home  Recommendations for Outpatient Follow-up:  1. Follow up with PCP in 1-2 weeks 2. Repeat liver function test in 1 week 3. Follow-up with gastroenterology and surgery as scheduled  Home Health: Not applicable Equipment/Devices: Not applicable  Discharge Condition: Stable CODE STATUS: Full code Diet recommendation: Regular diet  Discharge summary: Patient with multiple chronic issues including coronary artery disease, CABG, symptomatic bradycardia with pacemaker, chronic systolic heart failure recent hospitalization due to GI bleed, was found with slightly elevated bilirubin and dilated common bile duct.  Plan were for outpatient EUS, repeat bilirubin were elevated and was brought to emergency room.  She had generalized weakness and yellowness.  No other particular features.  Patient was in the hospital, developed A. fib with RVR.  Underwent EGD, ERCP on 04/14/2019 where she was found to have perforation of CBD and duodenum, treated with biliary metal stent with good results.  Subsequent CT scan did not show any free air.    She stayed in the hospital because of perforation per monitoring.  She did good clinical recovery without need for additional intervention.  Biliary ductal dilatation/choledocholithiasis with cholelithiasis and obstructive jaundice: Status post ERCP and biliary stenting.  Clinically improving.  Normal bowel function.  Biopsies pending.  Iatrogenic CBD perforation with concern of bile leak, stable, repeat CT scans and barium follow-through with no evidence of leakage.  Currently tolerating regular diet.  Advised to discharge on Protonix twice a day, received IV Zosyn in the hospital, 7 more days of Augmentin as prophylaxis. Developed A. fib, currently on metoprolol with  dose increased by cardiology.  Other antihypertensives were stopped.  Cardiology started patient on Eliquis.  Patient overall remains a stable.  As per surgical and GI recommendation, she will need interval cholecystectomy and surgery will make follow-up plan.   Discharge Diagnoses:  Principal Problem:   Painless jaundice Active Problems:   Atrial fibrillation (HCC)   CAD (coronary artery disease)   Hypothyroidism   DCM (dilated cardiomyopathy) [facility])    Discharge Instructions  Discharge Instructions    Call MD for:  persistant nausea and vomiting   Complete by: As directed    Call MD for:  severe uncontrolled pain   Complete by: As directed    Diet - low sodium heart healthy   Complete by: As directed    Increase activity slowly   Complete by: As directed      Allergies as of 04/18/2019   No Known Allergies     Medication List    STOP taking these medications   amoxicillin 500 MG capsule Commonly known as: AMOXIL   aspirin EC 325 MG tablet   benazepril 5 MG tablet Commonly known as: LOTENSIN   Cartia XT 240 MG 24 hr capsule Generic drug: diltiazem   clarithromycin 500 MG tablet Commonly known as: BIAXIN   lansoprazole 30 MG capsule Commonly known as: PREVACID   metoprolol tartrate 100 MG tablet Commonly known as: LOPRESSOR     TAKE these medications   amoxicillin-clavulanate 875-125 MG tablet Commonly known as: Augmentin Take 1 tablet by mouth 2 (two) times daily for 7 days.   apixaban 2.5 MG Tabs tablet Commonly known as: ELIQUIS Take 1 tablet (2.5 mg total) by mouth 2 (two) times daily.   atorvastatin 10 MG tablet Commonly known as: LIPITOR Take 10 mg by  mouth daily.   DULoxetine 30 MG capsule Commonly known as: CYMBALTA Take 1 capsule by mouth daily.   Ferrex 150 150 MG capsule Generic drug: iron polysaccharides Take 150 mg by mouth daily.   gabapentin 300 MG capsule Commonly known as: NEURONTIN Take 300 mg by mouth at bedtime. For  pain/sleep   levothyroxine 50 MCG tablet Commonly known as: SYNTHROID Take 50 mcg by mouth daily before breakfast.   metoprolol 200 MG 24 hr tablet Commonly known as: TOPROL-XL Take 1 tablet (200 mg total) by mouth daily. Take with or immediately following a meal. What changed:   medication strength  how much to take   nitroGLYCERIN 0.4 MG SL tablet Commonly known as: NITROSTAT Place 1 tablet (0.4 mg total) under the tongue every 5 (five) minutes as needed for chest pain.   pantoprazole 40 MG tablet Commonly known as: Protonix Take 1 tablet (40 mg total) by mouth 2 (two) times daily. What changed: when to take this   vitamin C 500 MG tablet Commonly known as: ASCORBIC ACID Take 500 mg by mouth 2 (two) times daily.   Vitamin D3 25 MCG (1000 UT) Caps Take 1,000 Units by mouth daily.      Follow-up Information    Rolm Bookbinder, MD Follow up in 2 week(s).   Specialty: General Surgery Contact information: [place] [address] [phone]          No Known Allergies  Consultations:  GI  Surgery  Cardiology   Procedures/Studies: Dg Abd 1 View  Result Date: 04/14/2019 CLINICAL DATA:  NG tube placement EXAM: ABDOMEN - 1 VIEW COMPARISON:  04/14/2019 FINDINGS: The enteric tube projects over the gastric body. The bowel gas pattern is nonspecific and nonobstructive. A biliary stent is noted. IMPRESSION: Enteric tube projects over the gastric body. Electronically Signed   By: Constance Holster M.D.   On: 04/14/2019 19:07   Dg Abd 1 View  Result Date: 04/14/2019 CLINICAL DATA:  Evaluate OG tube EXAM: ABDOMEN - 1 VIEW COMPARISON:  None. FINDINGS: The OG tube terminates within the left upper quadrant, within the stomach. IMPRESSION: The OG tube terminates within the left upper quadrant, within the stomach. Electronically Signed   By: Dorise Bullion III M.D   On: 04/14/2019 13:33   Ct Head Wo Contrast  Result Date:  03/22/2019 CLINICAL DATA:  Fall in bathroom striking back of head. EXAM: CT HEAD WITHOUT CONTRAST CT CERVICAL SPINE WITHOUT CONTRAST TECHNIQUE: Multidetector CT imaging of the head and cervical spine was performed following the standard protocol without intravenous contrast. Multiplanar CT image reconstructions of the cervical spine were also generated. COMPARISON:  None. FINDINGS: CT HEAD FINDINGS Brain: Ventricles and cisterns are within normal. There is mild age related atrophy as well as chronic ischemic microvascular disease. There is no mass, mass effect, shift of midline structures or acute hemorrhage. Suggestion of an old small left posterior watershed infarct. No acute infarction. Vascular: No hyperdense vessel or unexpected calcification. Skull: Normal. Negative for fracture or focal lesion. Sinuses/Orbits: No acute finding. Other: None. CT CERVICAL SPINE FINDINGS Alignment: Normal. Skull base and vertebrae: Mild spondylosis of the cervical spine. Vertebral body heights are maintained. Atlantoaxial articulation is normal. Mild uncovertebral joint spurring. Mild left-sided neural from narrowing at the C5-6 level and right-sided neural foraminal narrowing at the C6-7 level. No acute fracture or subluxation. Soft tissues and spinal canal: No prevertebral fluid or swelling. No visible canal hematoma. Disc levels:  Mild disc space  narrowing at the C5-6 and C6-7 levels. Upper chest: No acute findings. Mild calcified plaque over the aortic arch. Other: None. IMPRESSION: 1.  No acute brain injury. 2. Mild chronic ischemic microvascular disease and age related atrophy. 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 3.  No acute cervical spine injury. 4. Mild spondylosis of the cervical spine with disc disease at the C5-6 and C6-7 levels and mild neural foraminal narrowing as described. 5.  Aortic Atherosclerosis (ICD10-I70.0). Electronically Signed   By: Marin Olp M.D.   On: 03/22/2019 15:41   Ct Cervical Spine  Wo Contrast  Result Date: 03/22/2019 CLINICAL DATA:  Fall in bathroom striking back of head. EXAM: CT HEAD WITHOUT CONTRAST CT CERVICAL SPINE WITHOUT CONTRAST TECHNIQUE: Multidetector CT imaging of the head and cervical spine was performed following the standard protocol without intravenous contrast. Multiplanar CT image reconstructions of the cervical spine were also generated. COMPARISON:  None. FINDINGS: CT HEAD FINDINGS Brain: Ventricles and cisterns are within normal. There is mild age related atrophy as well as chronic ischemic microvascular disease. There is no mass, mass effect, shift of midline structures or acute hemorrhage. Suggestion of an old small left posterior watershed infarct. No acute infarction. Vascular: No hyperdense vessel or unexpected calcification. Skull: Normal. Negative for fracture or focal lesion. Sinuses/Orbits: No acute finding. Other: None. CT CERVICAL SPINE FINDINGS Alignment: Normal. Skull base and vertebrae: Mild spondylosis of the cervical spine. Vertebral body heights are maintained. Atlantoaxial articulation is normal. Mild uncovertebral joint spurring. Mild left-sided neural from narrowing at the C5-6 level and right-sided neural foraminal narrowing at the C6-7 level. No acute fracture or subluxation. Soft tissues and spinal canal: No prevertebral fluid or swelling. No visible canal hematoma. Disc levels:  Mild disc space narrowing at the C5-6 and C6-7 levels. Upper chest: No acute findings. Mild calcified plaque over the aortic arch. Other: None. IMPRESSION: 1.  No acute brain injury. 2. Mild chronic ischemic microvascular disease and age related atrophy. 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 3.  No acute cervical spine injury. 4. Mild spondylosis of the cervical spine with disc disease at the C5-6 and C6-7 levels and mild neural foraminal narrowing as described. 5.  Aortic Atherosclerosis (ICD10-I70.0). Electronically Signed   By: Marin Olp M.D.   On: 03/22/2019  15:41   Ct Abdomen Pelvis W Contrast  Result Date: 04/14/2019 CLINICAL DATA:  Perforation of the bile duct during ERCP with concern for retroperitoneal leak prior to placement a fully covered stents. No leak discovered post stent placement. EXAM: CT ABDOMEN AND PELVIS WITH CONTRAST TECHNIQUE: Multidetector CT imaging of the abdomen and pelvis was performed using the standard protocol following bolus administration of intravenous contrast. CONTRAST:  140mL OMNIPAQUE IOHEXOL 300 MG/ML  SOLN COMPARISON:  March 23, 2019 FINDINGS: Lower chest: No acute abnormality. Hepatobiliary: Pneumobilia is consistent with history of recent ERCP and stent placement. There is a stent in the common bile duct extending to the level of the duodenum. High attenuation material in the gallbladder is likely contrast. This is new since March 23, 2019. The common bile duct is patent. Hepatic steatosis is identified. No focal liver mass is noted. Pancreas: The pancreatic head is partially obscured by the common bile duct stent. No pancreatic masses noted. Spleen: Normal in size without focal abnormality. Adrenals/Urinary Tract: There is a cyst in the right kidney. The adrenal glands, kidneys, ureters, and bladder are otherwise unremarkable. There is a Foley catheter in the bladder which is not completely compressed. Air in  the bladder is likely from the Foley catheter. Stomach/Bowel: An NG tube terminates in the stomach. The stomach and small bowel are normal. The colon is normal. The appendix is not seen but there is no secondary evidence of appendicitis. Vascular/Lymphatic: Atherosclerotic changes are seen in the nonaneurysmal aorta. No adenopathy. Reproductive: Status post hysterectomy. No adnexal masses. Other: No free fluid is seen in the pelvis.  No free air. Musculoskeletal: No acute or significant osseous findings. IMPRESSION: 1. No free fluid in the abdomen or pelvis to suggest bile leak. 2. Common bile duct stent as above. 3.  Hepatic steatosis. 4. Atherosclerotic changes in the abdominal aorta. Electronically Signed   By: Dorise Bullion III M.D   On: 04/14/2019 15:40   Ct Abdomen Pelvis W Contrast  Result Date: 03/22/2019 CLINICAL DATA:  85 year old female with history of trauma from a fall in the bathroom. Abdominal pain. Blood in stool. EXAM: CT ABDOMEN AND PELVIS WITH CONTRAST TECHNIQUE: Multidetector CT imaging of the abdomen and pelvis was performed using the standard protocol following bolus administration of intravenous contrast. CONTRAST:  50mL OMNIPAQUE IOHEXOL 300 MG/ML  SOLN COMPARISON:  None. FINDINGS: Lower chest: Aortic atherosclerosis. Mild cardiomegaly. Pacemaker lead extending in the right ventricle. Status post median sternotomy. Hepatobiliary: No suspicious cystic or solid hepatic lesions. Very mild intrahepatic biliary ductal dilatation, particularly and segments 2 and 3 of the liver. Common bile duct is dilated measuring up to 12 mm in the porta hepatis. No definite calcified stone in the common bile duct. Gallbladder is moderately distended. No definite calcified gallstones. No gallbladder wall thickening or pericholecystic fluid or inflammatory changes. Pancreas: No pancreatic mass. No pancreatic ductal dilatation. No pancreatic or peripancreatic fluid collections or inflammatory changes. Spleen: Unremarkable. Adrenals/Urinary Tract: 1.3 cm low-attenuation lesion in the upper pole the right kidney, compatible with a simple cyst. Subcentimeter low-attenuation lesion in the anterior aspect of the interpolar region of the left kidney, too small to characterize, but statistically likely to represent a cyst. No hydroureteronephrosis. Urinary bladder is normal in appearance. Bilateral adrenal glands are normal in appearance. Stomach/Bowel: Normal appearance of the stomach. No pathologic dilatation of small bowel or colon. The appendix is not confidently identified and may be surgically absent. Regardless, there are  no inflammatory changes noted adjacent to the cecum to suggest the presence of an acute appendicitis at this time. Vascular/Lymphatic: Aortic atherosclerosis, without evidence of aneurysm or dissection in the abdominal or pelvic vasculature. No lymphadenopathy noted in the abdomen or pelvis. Reproductive: Status post hysterectomy.  Ovaries are a trophic. Other: No significant volume of ascites.  No pneumoperitoneum. Musculoskeletal: Chronic appearing compression fracture of superior endplate of L1 with [id number] loss of anterior vertebral body height. There are no aggressive appearing lytic or blastic lesions noted in the visualized portions of the skeleton. IMPRESSION: 1. No evidence of significant acute traumatic injury to the abdomen or pelvis. 2. Mild intra and extrahepatic biliary ductal dilatation. No choledocholithiasis. No definite pancreatic head mass. These findings could simply be age related, however, if there is any biochemical evidence of biliary tract obstruction, further evaluation with abdominal MRI with and without IV gadolinium with MRCP should be considered to exclude the possibility of small ampullary neoplasm. 3. Cardiomegaly. 4. Aortic atherosclerosis. Electronically Signed   By: Vinnie Langton M.D.   On: 03/22/2019 15:41   Ct Abdomen W Wo Contrast  Result Date: 03/23/2019 CLINICAL DATA:  Abnormal liver function tests. EXAM: CT ABDOMEN WITHOUT AND WITH CONTRAST TECHNIQUE: Multidetector CT imaging of the  abdomen was performed following the standard protocol before and following the bolus administration of intravenous contrast. CONTRAST:  156mL OMNIPAQUE IOHEXOL 300 MG/ML  SOLN COMPARISON:  01/20/2019 FINDINGS: Lower chest: Centrilobular emphsyema noted. Hepatobiliary: No hypervascular lesion in the liver parenchyma on arterial phase postcontrast imaging. Gallbladder is distended with intermediate attenuation material in the lumen, potentially sludge or vicarious excretion of contrast from  yesterday's CT scan. Trace pericholecystic fluid noted. As noted yesterday, there is mild intrahepatic and marked extrahepatic biliary duct dilatation. Extrahepatic common duct measures 11 mm diameter today compared to 12 mm yesterday. Common bile duct in the head of the pancreas is 12 mm diameter. The bile in the common duct is low-density, approaching water attenuation at 11 Hounsfield units. This becomes gradually higher in attenuation towards the ampulla where average attenuation of luminal contents in the common bile duct at the head of pancreas are 49 Hounsfield units on postcontrast series 9, image 40. There is an abrupt cut off of the pancreatic duct at the ampulla. Pancreas: Pancreatic parenchyma is atrophic without dilatation of the main pancreatic duct. No pancreatic head mass evident. Spleen: No splenomegaly. No focal mass lesion. Adrenals/Urinary Tract: No adrenal nodule or mass. Small cyst again noted upper pole right kidney. Left kidney unremarkable. Stomach/Bowel: Tiny hiatal hernia. Stomach otherwise unremarkable. Duodenum is normally positioned as is the ligament of Treitz. No small bowel or colonic dilatation within the visualized abdomen. Vascular/Lymphatic: There is abdominal aortic atherosclerosis without aneurysm. There is no gastrohepatic or hepatoduodenal ligament lymphadenopathy. No intraperitoneal or retroperitoneal lymphadenopathy. Other: No intraperitoneal free fluid. Musculoskeletal: No worrisome lytic or sclerotic osseous abnormality. Mild superior endplate compression deformity at L1. IMPRESSION: 1. Similar appearance of intra and extrahepatic biliary duct dilatation with common bile duct dilatation extending right to the ampulla. Intraluminal contents of the common bile duct in the head of pancreas just proximal to the ampulla are higher attenuation than in the common duct at the level of the porta hepatis. This could be related to intraluminal sludge, noncalcified stone, or  hemorrhage. This region does not appear to substantially enhance after IV contrast administration making soft tissue lesion less likely but not entirely excluded. Presence of a permanent pacemaker likely precludes MRCP. ERCP may be warranted to further evaluate given the abnormal attenuation in the distal common bile duct period. 2. Tiny hiatal hernia. 3.  Aortic Atherosclerois (ICD10-170.0) Electronically Signed   By: Misty Stanley M.D.   On: 03/23/2019 16:52   Dg Chest Port 1 View  Result Date: 04/14/2019 CLINICAL DATA:  Evaluate ETT EXAM: PORTABLE CHEST 1 VIEW COMPARISON:  March 22, 2019 FINDINGS: No pneumothorax. An ET tube is been placed, terminating in the mid trachea, in good position. The NG tube terminates below today's film. Since the cardiomediastinal silhouette is unchanged. No nodules or masses. No focal infiltrates. IMPRESSION: 1. Support apparatus as above. 2. No acute abnormalities otherwise seen. Electronically Signed   By: Dorise Bullion III M.D   On: 04/14/2019 13:32   Dg Chest Portable 1 View  Result Date: 03/22/2019 CLINICAL DATA:  Pain after fall. EXAM: PORTABLE CHEST 1 VIEW COMPARISON:  None. FINDINGS: The heart size and mediastinal contours are within normal limits. Both lungs are clear. No pneumothorax or pleural effusion is noted. Sternotomy wires are noted. Left-sided pacemaker is unchanged in position. The visualized skeletal structures are unremarkable. IMPRESSION: No active disease. Electronically Signed   By: Marijo Conception M.D.   On: 03/22/2019 12:46   Dg Ercp Biliary & Pancreatic  Ducts  Result Date: 04/14/2019 CLINICAL DATA:  Biliary obstruction. EXAM: ERCP TECHNIQUE: Multiple spot images obtained with the fluoroscopic device and submitted for interpretation post-procedure. COMPARISON:  CT of the abdomen on 03/23/2019 FINDINGS: Imaging with a C-arm during the endoscopic procedure demonstrates cannulation of the common bile duct with contrast injection demonstrating  dilatation of the common bile duct and apparent focal stricture of the distal common bile duct. Balloon dilatation was performed followed by placement a self expanding stent. IMPRESSION: Findings consistent with focal stricture of the distal common bile duct. Balloon dilatation was performed followed by placement of a self expanding stent. These images were submitted for radiologic interpretation only. Please see the procedural report for the amount of contrast and the fluoroscopy time utilized. Electronically Signed   By: Aletta Edouard M.D.   On: 04/14/2019 12:52   Dg Abd 2 Views  Result Date: 04/15/2019 CLINICAL DATA:  Status post ERCP. EXAM: ABDOMEN - 2 VIEW COMPARISON:  Plain film of the abdomen from earlier same day. Plain film of the abdomen dated 04/14/2019. CT abdomen dated 04/14/2019. FINDINGS: Biliary stent appears stable in position/configuration. Bowel gas pattern is nonobstructive. No evidence of soft tissue mass or abnormal fluid collection. No evidence of free intraperitoneal air appreciated. No evidence of renal or ureteral calculi. Lung bases are clear. IMPRESSION: 1. Biliary stent appears stable in position/configuration. 2. Nonobstructive bowel gas pattern. No acute findings. Electronically Signed   By: Franki Cabot M.D.   On: 04/15/2019 15:15   Dg Abd Portable 1v  Result Date: 04/15/2019 CLINICAL DATA:  Biliary obstruction. EXAM: PORTABLE ABDOMEN - 1 VIEW COMPARISON:  Yesterday. FINDINGS: Paucity of intestinal gas with no dilated bowel loops seen. Oral contrast in normal appearing cecum and inferior right colon. Gas in normal caliber rectum. Nasogastric tube tip in the mid stomach and side hole in the proximal stomach. Stable biliary stent. Mild lumbar and lower thoracic spine degenerative changes. Right hip degenerative changes. IMPRESSION: No acute abnormality. Electronically Signed   By: Claudie Revering M.D.   On: 04/15/2019 08:34   Dg Abd Portable 1v  Result Date:  04/14/2019 CLINICAL DATA:  OG tube placement. Perforated bowel. EXAM: PORTABLE ABDOMEN - 1 VIEW COMPARISON:  CT abdomen dated 03/23/2019. FINDINGS: OG tube appears well positioned in the stomach. Interval placement of a biliary stent which appears appropriately positioned. Visualized bowel gas pattern is nonobstructive. No definite free intraperitoneal air is seen under the diaphragms. Lung bases are clear. IMPRESSION: 1. OG tube appears well positioned in the stomach. 2. Interval placement of a biliary stent which appears appropriately positioned. 3. Nonobstructive bowel gas pattern. Electronically Signed   By: Franki Cabot M.D.   On: 04/14/2019 13:13   Dg Humerus Left  Result Date: 03/22/2019 CLINICAL DATA:  Tripped on file, history of atrial fibrillation. EXAM: LEFT HUMERUS - 2+ VIEW COMPARISON:  None FINDINGS: There is no evidence of fracture or other focal bone lesions. Soft tissues are unremarkable. IMPRESSION: Negative. Electronically Signed   By: Zetta Bills M.D.   On: 03/22/2019 12:47   Dg Duanne Limerick W Single Cm (sol Or Thin Ba)  Result Date: 04/16/2019 CLINICAL DATA:  Patient underwent ERCP. A perforation is noted at the duodenum, distal bile duct, treated with stent placement. Current evaluation is to assess for leak. EXAM: WATER SOLUBLE UPPER GI SERIES: THIN BARIUM UTILIZED AFTER WATER-SOLUBLE CONTRAST TECHNIQUE: Single-column upper GI series was performed using water soluble contrast. CONTRAST:  Moderate-sized with contrast followed by thin barium. COMPARISON:  Prior  exams. FLUOROSCOPY TIME:  Fluoroscopy Time:  2 minutes and 0 seconds. Radiation Exposure Index (if provided by the fluoroscopic device): 20 mGy. Number of Acquired Spot Images: 13 FINDINGS: Initial scout radiograph shows right upper quadrant biliary stent. Normal bowel gas pattern. Esophagus is normal course and in caliber.  No mucosal abnormality. Stomach is normal in overall size and configuration with no mucosal abnormality. Normal  appearance of the duodenum with normal duodenal fold pattern. No contrast extravasation to indicate a residual leak. Contrast promptly impede from the stomach through the duodenum into the jejunum. IMPRESSION: 1. No evidence of a perforation/leak. 2. Negative exam. No masses, strictures or evidence of inflammation. Electronically Signed   By: Lajean Manes M.D.   On: 04/16/2019 09:49   US Abdomen Limited Ruq  Result Date: 04/12/2019 CLINICAL DATA:  85 year old female with jaundice. EXAM: ULTRASOUND ABDOMEN LIMITED RIGHT UPPER QUADRANT COMPARISON:  CT abdomen pelvis dated 03/23/2019 FINDINGS: Gallbladder: There is sludge within the gallbladder. The gallbladder is distended. No gallbladder wall thickening or pericholecystic fluid. Negative sonographic Murphy's sign. Common bile duct: Diameter: The common bile duct is dilated measuring up to 14 mm. No shadowing stone noted in the visualized CBD. The central portion of the CBD at the head of the pancreas is not well visualized. Liver: The liver is grossly unremarkable as visualized. There is mild intrahepatic biliary ductal dilatation. Portal vein is patent on color Doppler imaging with normal direction of blood flow towards the liver. Other: There is a 13 mm right renal upper pole cyst. There is advanced atherosclerotic calcification of the aorta. IMPRESSION: 1. Distended gallbladder containing sludge. No sonographic findings of acute cholecystitis. 2. Dilated common bile duct. No stone identified in the visualized portion of the CBD. The central CBD at the head of the pancreas is suboptimally visualized. 3.  Aortic Atherosclerosis (ICD10-I70.0). Electronically Signed   By: Anner Crete M.D.   On: 04/12/2019 00:41     Subjective: Seen and examined no overnight events.  No more diarrhea.  Regular diet, no pain no nausea no vomiting.   Discharge Exam: Vitals:   04/17/19 2020 04/18/19 0450  BP: 115/76 115/77  Pulse: 80 85  Resp: 16 18  Temp: 97.7 F  (36.5 C) 97.8 F (36.6 C)  SpO2: 95% 96%   Vitals:   04/17/19 1159 04/17/19 2020 04/18/19 0450 04/18/19 0459  BP: 117/70 115/76 115/77   Pulse: 72 80 85   Resp: 20 16 18    Temp: 97.8 F (36.6 C) 97.7 F (36.5 C) 97.8 F (36.6 C)   TempSrc: Oral Oral Oral   SpO2: 96% 95% 96%   Weight:    50.1 kg  Height:        General: Pt is alert, awake, not in acute distress, no more icteric. Cardiovascular: RRR, S1/S2 +, no rubs, no gallops Respiratory: CTA bilaterally, no wheezing, no rhonchi Abdominal: Soft, NT, ND, bowel sounds + Extremities: no edema, no cyanosis    The results of significant diagnostics from this hospitalization (including imaging, microbiology, ancillary and laboratory) are listed below for reference.     Microbiology: Recent Results (from the past 240 hour(s))  SARS CORONAVIRUS 2 (TAT 6-24 HRS) Nasopharyngeal Nasopharyngeal Swab     Status: None   Collection Time: 04/12/19  5:41 AM   Specimen: Nasopharyngeal Swab  Result Value Ref Range Status   SARS Coronavirus 2 NEGATIVE NEGATIVE Final    Comment: (NOTE) SARS-CoV-2 target nucleic acids are NOT DETECTED. The SARS-CoV-2 RNA is generally detectable  in upper and lower respiratory specimens during the acute phase of infection. Negative results do not preclude SARS-CoV-2 infection, do not rule out co-infections with other pathogens, and should not be used as the sole basis for treatment or other patient management decisions. Negative results must be combined with clinical observations, patient history, and epidemiological information. The expected result is Negative. Fact Sheet for Patients: SugarRoll.be Fact Sheet for Healthcare Providers: https://www.woods-mathews.com/ This test is not yet approved or cleared by the [place] FDA and  has been authorized for detection and/or diagnosis of SARS-CoV-2 by FDA under an Emergency Use Authorization (EUA). This EUA will  remain  in effect (meaning this test can be used) for the duration of the COVID-19 declaration under Section 56 4(b)(1) of the Act, 21 U.S.C. section 360bbb-3(b)(1), unless the authorization is terminated or revoked sooner. Performed at [facility], [place] [address], [place], [place] [address]      Labs: BNP (last 3 results) No results for input(s): BNP in the last 8760 hours. Basic Metabolic Panel: Recent Labs  Lab 04/14/19 0307 04/14/19 1430 04/15/19 0526 04/16/19 0609 04/17/19 0606 04/18/19 0307  NA 138 132* 135 136 139 140  K 3.4* 3.9 4.6 3.7 3.3* 4.1  CL 101  --  100 100 101 107  CO2 24  --  22 24 27 24   GLUCOSE 123*  --  128* 102* 96 113*  BUN 8  --  11 12 6* <5*  CREATININE 0.63  --  0.70 0.74 0.70 0.67  CALCIUM 8.9  --  9.0 8.3* 8.5* 8.8*  MG  --   --   --  1.6*  --   --   PHOS  --   --   --  3.5  --   --    Liver Function Tests: Recent Labs  Lab 04/13/19 0349 04/15/19 0526 04/16/19 0609 04/17/19 0606 04/18/19 0307  AST 148* 132* 101* 130* 133*  ALT 102* 103* 76* 90* 93*  ALKPHOS 379* 359* 263* 281* 287*  BILITOT 8.2* 6.8* 4.7* 4.6* 3.4*  PROT 5.7* 6.3* 5.1* 5.5* 5.3*  ALBUMIN 2.4* 2.8* 2.2* 2.4* 2.4*   Recent Labs  Lab 04/15/19 0526 04/16/19 0609  LIPASE 27 33   No results for input(s): AMMONIA in the last 168 hours. CBC: Recent Labs  Lab 04/14/19 0307 04/14/19 1430 04/15/19 0526 04/16/19 0609 04/17/19 0606 04/18/19 0307  WBC 6.9  --  11.2* 8.4 6.7 6.4  NEUTROABS 3.9  --  9.6* 5.5 3.5 3.2  HGB 10.3* 11.9* 11.6* 9.8* 10.6* 10.2*  HCT 30.3* 35.0* 35.6* 30.3* 32.4* 31.1*  MCV 85.6  --  89.4 89.9 90.0 89.1  PLT 326  --  358 327 324 303   Cardiac Enzymes: No results for input(s): CKTOTAL, CKMB, CKMBINDEX, TROPONINI in the last 168 hours. BNP: Invalid input(s): POCBNP CBG: Recent Labs  Lab 04/13/19 0654 04/15/19 0809 04/16/19 0530 04/17/19 0620 04/18/19 0611  GLUCAP 102* 116* 102* 85 124*   D-Dimer No results for input(s):  DDIMER in the last 72 hours. Hgb A1c No results for input(s): HGBA1C in the last 72 hours. Lipid Profile No results for input(s): CHOL, HDL, LDLCALC, TRIG, CHOLHDL, LDLDIRECT in the last 72 hours. Thyroid function studies No results for input(s): TSH, T4TOTAL, T3FREE, THYROIDAB in the last 72 hours.  Invalid input(s): FREET3 Anemia work up No results for input(s): VITAMINB12, FOLATE, FERRITIN, TIBC, IRON, RETICCTPCT in the last 72 hours. Urinalysis    Component Value Date/Time  COLORURINE YELLOW 03/22/2019 1226   APPEARANCEUR HAZY (A) 03/22/2019 1226   LABSPEC >1.046 (H) 03/22/2019 1226   PHURINE 5.0 03/22/2019 1226   GLUCOSEU NEGATIVE 03/22/2019 1226   HGBUR NEGATIVE 03/22/2019 1226   BILIRUBINUR NEGATIVE 03/22/2019 1226   KETONESUR NEGATIVE 03/22/2019 1226   PROTEINUR NEGATIVE 03/22/2019 1226   NITRITE NEGATIVE 03/22/2019 1226   LEUKOCYTESUR NEGATIVE 03/22/2019 1226   Sepsis Labs Invalid input(s): PROCALCITONIN,  WBC,  LACTICIDVEN Microbiology Recent Results (from the past 240 hour(s))  SARS CORONAVIRUS 2 (TAT 6-24 HRS) Nasopharyngeal Nasopharyngeal Swab     Status: None   Collection Time: 04/12/19  5:41 AM   Specimen: Nasopharyngeal Swab  Result Value Ref Range Status   SARS Coronavirus 2 NEGATIVE NEGATIVE Final    Comment: (NOTE) SARS-CoV-2 target nucleic acids are NOT DETECTED. The SARS-CoV-2 RNA is generally detectable in upper and lower respiratory specimens during the acute phase of infection. Negative results do not preclude SARS-CoV-2 infection, do not rule out co-infections with other pathogens, and should not be used as the sole basis for treatment or other patient management decisions. Negative results must be combined with clinical observations, patient history, and epidemiological information. The expected result is Negative. Fact Sheet for Patients: SugarRoll.be Fact Sheet for Healthcare  Providers: https://www.woods-mathews.com/ This test is not yet approved or cleared by the [place] FDA and  has been authorized for detection and/or diagnosis of SARS-CoV-2 by FDA under an Emergency Use Authorization (EUA). This EUA will remain  in effect (meaning this test can be used) for the duration of the COVID-19 declaration under Section 56 4(b)(1) of the Act, 21 U.S.C. section 360bbb-3(b)(1), unless the authorization is terminated or revoked sooner. Performed at [facility], [place] [address] [place], [place] [address]      Time coordinating discharge: 40 minutes  SIGNED:   Barb Merino, MD  [facility] 04/18/2019, 8:37 AM

## 2019-04-18 NOTE — Progress Notes (Signed)
Cardiology Office Note    Date:  04/24/2019   ID:  Kaitlin Bundy, DOB Dec 04, 1933, MRN [id number]  PCP:  Caryl Bis, MD  Cardiologist: Kate Sable, MD EPS: Thompson Grayer, MD  Chief Complaint  Patient presents with  . Hospitalization Follow-up    History of Present Illness:  Angelica Hernandez is a 85 y.o. female with history of CAD status post CABG in 1980s, sick sinus syndrome status post pacemaker, permanent atrial fibrillation.  Echo 02/2018 LVEF 45 to 50%.   patient was discharged 04/18/19 after admission with jaundice and went into rapid atrial fibrillation after ERCP however she was found to have perforation, bile duct and duodenum treated with biliary stent.  Subsequent CT showed no free air..  Low blood pressure prohibited further titration of her medications.  Her home diltiazem was on hold because of low blood pressures.  Metoprolol was consolidated to succinate to help with rate control.  Coumadin was changed to Eliquis 04/17/2019 aspirin stopped.  LV dysfunction ACE inhibitor or ARB was recommended at follow-up of blood pressure and renal allow.  Patient comes in today accompanied by her daughter.  She says  overall she is feeling well. BP is still quite low. Patient denies dizziness.  Does not know when she has a fast heart rate but heart rate is controlled today.  Patient scheduled for gallbladder surgery in a couple of weeks at [facility].  Denies chest pain or shortness of breath.  Past Medical History:  Diagnosis Date  . Anxiety   . CAD (coronary artery disease)   . CKD (chronic kidney disease)   . Gastro-esophageal reflux disease without esophagitis   . Hypothyroidism   . Ischemic cardiomyopathy   . Jaundice 03/2019  . Mild dilation of ascending aorta (HCC)   . Moderate protein-calorie malnutrition ([place])   . Permanent atrial fibrillation ([place])   . Severe episode of recurrent major depressive disorder, without psychotic features ([place])   . Symptomatic  bradycardia     Past Surgical History:  Procedure Laterality Date  . BILIARY DILATION  04/14/2019   Procedure: BILIARY DILATION;  Surgeon: Rush Landmark Telford Nab., MD;  Location: [place];  Service: Gastroenterology;;  . BILIARY STENT PLACEMENT  04/14/2019   Procedure: BILIARY STENT PLACEMENT;  Surgeon: Irving Copas., MD;  Location: [place];  Service: Gastroenterology;;  . BIOPSY  04/14/2019   Procedure: BIOPSY;  Surgeon: Irving Copas., MD;  Location: [place];  Service: Gastroenterology;;  . CESAREAN SECTION    . COLONOSCOPY N/A 03/26/2019   Procedure: COLONOSCOPY;  Surgeon: Daneil Dolin, MD;  [facility];  Service: Endoscopy;  Laterality: N/A;  . CORONARY ARTERY BYPASS GRAFT  1970   1970s when living in [place]  . ERCP N/A 04/14/2019   Procedure: ENDOSCOPIC RETROGRADE CHOLANGIOPANCREATOGRAPHY (ERCP);  Surgeon: Irving Copas., MD;  Location: [place];  Service: Gastroenterology;  Laterality: N/A;  . ESOPHAGOGASTRODUODENOSCOPY (EGD) WITH PROPOFOL N/A 03/26/2019   Procedure: ESOPHAGOGASTRODUODENOSCOPY (EGD) WITH PROPOFOL;  Surgeon: Daneil Dolin, MD;  [facility];  Service: Endoscopy;  Laterality: N/A;  . ESOPHAGOGASTRODUODENOSCOPY (EGD) WITH PROPOFOL N/A 04/14/2019   Procedure: ESOPHAGOGASTRODUODENOSCOPY (EGD) WITH PROPOFOL;  Surgeon: Rush Landmark Telford Nab., MD;  Location: [place];  Service: Gastroenterology;  Laterality: N/A;  . EUS N/A 04/14/2019   Procedure: UPPER ENDOSCOPIC ULTRASOUND (EUS) LINEAR;  Surgeon: Irving Copas., MD;  Location: [place];  Service: Gastroenterology;  Laterality: N/A;  . HEMOSTASIS CLIP PLACEMENT  04/14/2019   Procedure: HEMOSTASIS  CLIP PLACEMENT;  Surgeon: Mansouraty, Telford Nab., MD;  Location: [place];  Service: Gastroenterology;;  . OTHER SURGICAL HISTORY     cesearan section   . PACEMAKER GENERATOR CHANGE  06/15/2017   [employer] Accolade SR L  300 ([place] [id number]) implanted by Dr Ernestina Patches at [facility] in [place] for symptomatic bradycardia  . PACEMAKER IMPLANT  11/16/2006   PPM implanted for bradycardia  . PANCREATIC STENT PLACEMENT  04/14/2019   Procedure: PANCREATIC STENT PLACEMENT;  Surgeon: Rush Landmark Telford Nab., MD;  Location: [place];  Service: Gastroenterology;;  . REMOVAL OF STONES  04/14/2019   Procedure: REMOVAL OF STONES;  Surgeon: Irving Copas., MD;  Location: [place];  Service: Gastroenterology;;  . Joan Mayans  04/14/2019   Procedure: Joan Mayans;  Surgeon: Irving Copas., MD;  Location: [place];  Service: Gastroenterology;;    Current Medications: Current Meds  Medication Sig  . amoxicillin-clavulanate (AUGMENTIN) 875-125 MG tablet Take 1 tablet by mouth 2 (two) times daily for 7 days.  Marland Kitchen apixaban (ELIQUIS) 2.5 MG TABS tablet Take 1 tablet (2.5 mg total) by mouth 2 (two) times daily.  Marland Kitchen atorvastatin (LIPITOR) 10 MG tablet Take 10 mg by mouth daily.  . Cholecalciferol (VITAMIN D3) 1000 units CAPS Take 1,000 Units by mouth daily.  . DULoxetine (CYMBALTA) 30 MG capsule Take 1 capsule by mouth daily.  Marland Kitchen FERREX 150 150 MG capsule Take 150 mg by mouth daily.   Marland Kitchen gabapentin (NEURONTIN) 300 MG capsule Take 300 mg by mouth at bedtime. For pain/sleep  . levothyroxine (SYNTHROID, LEVOTHROID) 50 MCG tablet Take 50 mcg by mouth daily before breakfast.  . metoprolol succinate (TOPROL-XL) 200 MG 24 hr tablet Take 1 tablet (200 mg total) by mouth daily. Take with or immediately following a meal.  . nitroGLYCERIN (NITROSTAT) 0.4 MG SL tablet Place 1 tablet (0.4 mg total) under the tongue every 5 (five) minutes as needed for chest pain.  . pantoprazole (PROTONIX) 40 MG tablet Take 1 tablet (40 mg total) by mouth 2 (two) times daily.  . vitamin C (ASCORBIC ACID) 500 MG tablet Take 500 mg by mouth 2 (two) times daily.      Allergies:   Patient has no known  allergies.   Social History   Socioeconomic History  . Marital status: Widowed    Spouse name: Not on file  . Number of children: Not on file  . Years of education: Not on file  . Highest education level: Not on file  Occupational History  . Not on file  Social Needs  . Financial resource strain: Not on file  . Food insecurity    Worry: Not on file    Inability: Not on file  . Transportation needs    Medical: Not on file    Non-medical: Not on file  Tobacco Use  . Smoking status: Current Every Day Smoker    Packs/day: 0.50    Types: Cigarettes  . Smokeless tobacco: Never Used  Substance and Sexual Activity  . Alcohol use: Never    Frequency: Never  . Drug use: Never  . Sexual activity: Not on file  Lifestyle  . Physical activity    Days per week: Not on file    Minutes per session: Not on file  . Stress: Not on file  Relationships  . Social Herbalist on phone: Not on file    Gets together: Not on file    Attends religious service: Not on file  Active member of club or organization: Not on file    Attends meetings of clubs or organizations: Not on file    Relationship status: Not on file  Other Topics Concern  . Not on file  Social History Narrative   Lives with daughter in [place]     Family History:  The patient's   family history includes Diabetes in her mother; Heart disease in her father and mother; Hypertension in her brother, father, and mother.   ROS:   Please see the history of present illness.    ROS All other systems reviewed and are negative.   PHYSICAL EXAM:   VS:  BP (!) 88/58   Pulse 86   Temp (!) 97.5 F (36.4 C)   Ht 5\' 5"  (1.651 m)   Wt 113 lb (51.3 kg)   SpO2 94%   BMI 18.80 kg/m   Physical Exam  GEN: Thin, pale, elderly, in no acute distress, smells of cigarette smoke Neck: no JVD, carotid bruits, or masses Cardiac: Irregular irregular; no murmurs, rubs, or gallops  Respiratory: Decreased breath sounds but clear GI:  soft, nontender, nondistended, + BS Ext: without cyanosis, clubbing, or edema, Good distal pulses bilaterally Neuro:  Alert and Oriented x 3, Psych: euthymic mood, full affect  Wt Readings from Last 3 Encounters:  04/24/19 113 lb (51.3 kg)  04/18/19 110 lb 8 oz (50.1 kg)  03/30/19 118 lb (53.5 kg)      Studies/Labs Reviewed:   EKG:  EKG is not ordered today.   Recent Labs: 03/22/2019: TSH 1.464 04/16/2019: Magnesium 1.6 04/18/2019: ALT 93; BUN <5; Creatinine, Ser 0.67; Hemoglobin 10.2; Platelets 303; Potassium 4.1; Sodium 140   Lipid Panel No results found for: CHOL, TRIG, HDL, CHOLHDL, VLDL, LDLCALC, LDLDIRECT  Additional studies/ records that were reviewed today include:   Echo 02/22/18: Study Conclusions   - Left ventricle: The cavity size was normal. Wall thickness was   normal. Systolic function was mildly reduced. The estimated   ejection fraction was in the range of 45% to 50%. Diffuse   hypokinesis. Abnormal global longitudinal strain of -12.2%. The   study was not technically sufficient to allow evaluation of LV   diastolic dysfunction due to atrial fibrillation. - Aortic valve: Mildly calcified annulus. Trileaflet. - Mitral valve: Mildly calcified annulus. There was mild   regurgitation. - Right ventricle: Pacer wire or catheter noted in right ventricle. - Right atrium: Pacer wire or catheter noted in right atrium. - Atrial septum: No defect or patent foramen ovale was identified. - Tricuspid valve: There was moderate regurgitation. - Pulmonary arteries: PA peak pressure: 35 mm Hg (S). - Pericardium, extracardiac: There was no pericardial effusion.      ASSESSMENT:    1. Permanent atrial fibrillation ([place])   2. Coronary artery disease involving native coronary artery of native heart without angina pectoris   3. Hypotension due to drugs   4. Cardiac pacemaker in situ   5. Tobacco abuse      PLAN:  In order of problems listed above:  Persistent atrial  fibrillation with elevated rates after undergoing ERCP.  Patient's diltiazem has been on hold because of low blood pressures.  Metoprolol was changed to succinate to help with rate control.  Coumadin was changed to Eliquis.  Overall patient is doing well on Eliquis and metoprolol.  Heart rates controlled today.  Blood pressure is quite low so cannot start diltiazem back.  Scheduled for gallbladder surgery at some point this month.  I  have asked her to contact Dr. Bronson Ing concerning holding Eliquis before surgery.  CAD status post remote CABG on aspirin and Lipitor which was stopped in the hospital because of elevated LFTs in the setting of jaundice and beginning Eliquis.  No angina  Hypotension diltiazem and ACE inhibitor on hold at home because of low blood pressures blood pressure still quite low.  No dizziness.  Will not start back ACE inhibitor or diltiazem.  Permanent pacemaker for sick sinus syndrome followed by Dr. Rayann Heman  Tobacco abuse importance of smoking cessation discussed with patient.  Claims she is only smoking 1 cigarette a day.  Medication Adjustments/Labs and Tests Ordered: Current medicines are reviewed at length with the patient today.  Concerns regarding medicines are outlined above.  Medication changes, Labs and Tests ordered today are listed in the Patient Instructions below. Patient Instructions  Medication Instructions:  Your physician recommends that you continue on your current medications as directed. Please refer to the Current Medication list given to you today.  *If you need a refill on your cardiac medications before your next appointment, please call your pharmacy*  Lab Work: NONE  If you have labs (blood work) drawn today and your tests are completely normal, you will receive your results only by: Marland Kitchen MyChart Message (if you have MyChart) OR . A paper copy in the mail If you have any lab test that is abnormal or we need to change your treatment, we will call  you to review the results.  Testing/Procedures: NON  Follow-Up: At [facility], you and your health needs are our priority.  As part of our continuing mission to provide you with exceptional heart care, we have created designated Provider Care Teams.  These Care Teams include your primary Cardiologist (physician) and Advanced Practice Providers (APPs -  Physician Assistants and Nurse Practitioners) who all work together to provide you with the care you need, when you need it.  Your next appointment:   6 Weeks   The format for your next appointment:   In Person  Provider:   Kate Sable, MD  Other Instructions Thank you for choosing [facility]!       Signed, Ermalinda Barrios, PA-C  04/24/2019 1:29 PM    [facility] [place], [place], [place]  [address] Phone: [phone]; Fax: [phone]

## 2019-04-18 NOTE — Telephone Encounter (Signed)
-----   Message from Desma Paganini sent at 04/17/2019 11:49 AM EST ----- Scheduled for TOC w/ M. Lenze   Thank you

## 2019-04-20 ENCOUNTER — Encounter: Payer: Self-pay | Admitting: Gastroenterology

## 2019-04-24 ENCOUNTER — Other Ambulatory Visit: Payer: Self-pay

## 2019-04-24 ENCOUNTER — Ambulatory Visit: Payer: Medicare Other | Admitting: Physician Assistant

## 2019-04-24 ENCOUNTER — Encounter: Payer: Self-pay | Admitting: Physician Assistant

## 2019-04-24 VITALS — BP 88/58 | HR 86 | Temp 97.5°F | Ht 65.0 in | Wt 113.0 lb

## 2019-04-24 DIAGNOSIS — I4821 Permanent atrial fibrillation: Secondary | ICD-10-CM | POA: Diagnosis not present

## 2019-04-24 DIAGNOSIS — Z720 Tobacco use: Secondary | ICD-10-CM | POA: Diagnosis not present

## 2019-04-24 DIAGNOSIS — I952 Hypotension due to drugs: Secondary | ICD-10-CM | POA: Diagnosis not present

## 2019-04-24 DIAGNOSIS — Z950 Presence of cardiac pacemaker: Secondary | ICD-10-CM

## 2019-04-24 DIAGNOSIS — I2510 Atherosclerotic heart disease of native coronary artery without angina pectoris: Secondary | ICD-10-CM | POA: Diagnosis not present

## 2019-04-24 NOTE — Patient Instructions (Signed)
Medication Instructions:  Your physician recommends that you continue on your current medications as directed. Please refer to the Current Medication list given to you today.  *If you need a refill on your cardiac medications before your next appointment, please call your pharmacy*  Lab Work: NONE  If you have labs (blood work) drawn today and your tests are completely normal, you will receive your results only by: Marland Kitchen MyChart Message (if you have MyChart) OR . A paper copy in the mail If you have any lab test that is abnormal or we need to change your treatment, we will call you to review the results.  Testing/Procedures: NON  Follow-Up: At [facility], you and your health needs are our priority.  As part of our continuing mission to provide you with exceptional heart care, we have created designated Provider Care Teams.  These Care Teams include your primary Cardiologist (physician) and Advanced Practice Providers (APPs -  Physician Assistants and Nurse Practitioners) who all work together to provide you with the care you need, when you need it.  Your next appointment:   6 Weeks   The format for your next appointment:   In Person  Provider:   Kate Sable, MD  Other Instructions Thank you for choosing [facility]!

## 2019-04-26 DIAGNOSIS — I482 Chronic atrial fibrillation, unspecified: Secondary | ICD-10-CM | POA: Diagnosis not present

## 2019-04-26 DIAGNOSIS — D509 Iron deficiency anemia, unspecified: Secondary | ICD-10-CM | POA: Diagnosis not present

## 2019-04-26 DIAGNOSIS — D62 Acute posthemorrhagic anemia: Secondary | ICD-10-CM | POA: Diagnosis not present

## 2019-04-30 ENCOUNTER — Other Ambulatory Visit (HOSPITAL_COMMUNITY): Payer: Medicare Other

## 2019-05-01 ENCOUNTER — Ambulatory Visit (INDEPENDENT_AMBULATORY_CARE_PROVIDER_SITE_OTHER): Payer: Medicare Other | Admitting: Gastroenterology

## 2019-05-01 ENCOUNTER — Other Ambulatory Visit: Payer: Medicare Other

## 2019-05-01 ENCOUNTER — Other Ambulatory Visit: Payer: Self-pay

## 2019-05-01 ENCOUNTER — Encounter: Payer: Self-pay | Admitting: Gastroenterology

## 2019-05-01 VITALS — BP 104/60 | HR 98 | Temp 97.2°F | Ht 65.0 in | Wt 114.0 lb

## 2019-05-01 DIAGNOSIS — R7989 Other specified abnormal findings of blood chemistry: Secondary | ICD-10-CM

## 2019-05-01 DIAGNOSIS — Z862 Personal history of diseases of the blood and blood-forming organs and certain disorders involving the immune mechanism: Secondary | ICD-10-CM | POA: Diagnosis not present

## 2019-05-01 DIAGNOSIS — K829 Disease of gallbladder, unspecified: Secondary | ICD-10-CM

## 2019-05-01 DIAGNOSIS — Z9889 Other specified postprocedural states: Secondary | ICD-10-CM

## 2019-05-01 DIAGNOSIS — K8050 Calculus of bile duct without cholangitis or cholecystitis without obstruction: Secondary | ICD-10-CM

## 2019-05-01 DIAGNOSIS — R945 Abnormal results of liver function studies: Secondary | ICD-10-CM

## 2019-05-01 DIAGNOSIS — K8689 Other specified diseases of pancreas: Secondary | ICD-10-CM

## 2019-05-01 MED ORDER — PANTOPRAZOLE SODIUM 40 MG PO TBEC: 40.0000 mg | DELAYED_RELEASE_TABLET | Freq: Two times a day (BID) | ORAL | 4 refills | Status: AC

## 2019-05-01 NOTE — Patient Instructions (Signed)
Your provider has requested that you go to the basement level for lab work before leaving today. Press "B" on the elevator. The lab is located at the first door on the left as you exit the elevator.  It has been recommended to you by your physician that you have a(n) ERCP completed at hospital at the end of Dec/ 1st of Jan 2021. We did not schedule the procedure(s) today. Our office will contact you when schedule is available.   If you are age 83 or older, your body mass index should be between 23-30. Your Body mass index is 18.97 kg/m. If this is out of the aforementioned range listed, please consider follow up with your Primary Care Provider.  If you are age 69 or younger, your body mass index should be between 19-25. Your Body mass index is 18.97 kg/m. If this is out of the aformentioned range listed, please consider follow up with your Primary Care Provider.   We have sent the following medications to your pharmacy for you to pick up at your convenience: Pantoprazole   Thank you for choosing me and [facility].  Dr. Rush Landmark

## 2019-05-02 ENCOUNTER — Telehealth: Payer: Self-pay

## 2019-05-02 ENCOUNTER — Encounter: Payer: Self-pay | Admitting: Gastroenterology

## 2019-05-02 ENCOUNTER — Other Ambulatory Visit: Payer: Self-pay

## 2019-05-02 DIAGNOSIS — Z862 Personal history of diseases of the blood and blood-forming organs and certain disorders involving the immune mechanism: Secondary | ICD-10-CM | POA: Insufficient documentation

## 2019-05-02 DIAGNOSIS — Z9889 Other specified postprocedural states: Secondary | ICD-10-CM | POA: Insufficient documentation

## 2019-05-02 DIAGNOSIS — K8050 Calculus of bile duct without cholangitis or cholecystitis without obstruction: Secondary | ICD-10-CM | POA: Insufficient documentation

## 2019-05-02 DIAGNOSIS — K829 Disease of gallbladder, unspecified: Secondary | ICD-10-CM

## 2019-05-02 DIAGNOSIS — K8689 Other specified diseases of pancreas: Secondary | ICD-10-CM | POA: Insufficient documentation

## 2019-05-02 MED ORDER — APIXABAN 2.5 MG PO TABS: 2.5000 mg | ORAL_TABLET | Freq: Two times a day (BID) | ORAL | 5 refills | Status: AC

## 2019-05-02 NOTE — Progress Notes (Signed)
[place] VISIT   Primary Care Provider Caryl Bis, MD [place] [address] [phone]  Patient Profile: Angelica Hernandez is a 85 y.o. female with a pmh significant for pAFib (on Eliquis and status post PPM), CAD, chronic renal insufficiency, GERD, hypothyroidism, MDD, choledocholithiasis, status post recent ERCP with FCSEMS in place after concern for possible bile duct leak/perforation (though negative on imaging and workup), needs Gallbladder resection.  The patient presents to the [facility] for an evaluation and management of problem(s) noted below:  Problem List 1. Choledocholithiasis   2. History of biliary duct stent placement   3. History of ERCP   4. Gallbladder disease   5. History of anemia   6. Dilated pancreatic duct   7. Abnormal LFTs     History of Present Illness This is a patient who has been evaluated by Dr. Gala Romney previously.  She was initially referred to the lower GI group for consideration of endoscopic ultrasound in the setting of a dilated biliary tree and pancreatic duct.  She presented to the hospital with obstructive jaundice at the end of October.  An EUS and ERCP were performed as noted below.  EUS did not show any mass or lesion within the pancreas.  There was significant sludge and debris and a dilated biliary tract.  ERCP was subsequently performed by myself.  A sphincterotomy was performed and subsequently sphincteroplasty.  Upon balloon pull-through on her second pull-through we had sludge and debris come out but it looks like it actually had caused a potential perforation and what was the larger portion of the intraduodenal ampulla.  Subsequently with concern for the potential of extravasation of the final cholangiogram was reviewed and did not show any evidence place a fully covered self-expanding metal stent and a second 1 to bridge the very distal aspect of the bile duct to ensure that if there  had been a perforation that has been fully covered.  We subsequently followed her procedure with a CT scan and then a barium upper GI which confirmed on all imaging did not have any leak.  She was advance her diet and she was discharged.  Obstructive jaundice improved.  She needs to have a cholecystectomy but this was deferred so that we can allow the bile duct stent to be in place for a few weeks optimally 8 weeks before removal.  However this is fully covered self-expanding metal stent and during ERCP I did not see any evidence of the cystic duct filling.  Today, the patient returns with her daughter to clinic.  They thought they need to undergo repeat endoscopic ultrasound but this was actually the first endoscopic ultrasound that had been ordered before she returned to the hospital with obstructive jaundice.  So we canceled that today.  She otherwise doing and feeling well.  She occasionally will have abdominal discomfort and after she eats mostly in the midepigastrium and right upper quadrant.  It is self-limited and not lasting.  Her yellow jaundice is improved and her dark urine is back to normal.  Patient has been feeling a little bit weaker and nearly fell a few days ago.  She is having to be very mindful about how she is moving about.  Daughter is going to ask her primary care provider about a potential walker.  GI Review of Systems Positive as above Negative for dysphagia, odynophagia, weight loss, change in bowel habits, melena, hematochezia  Review of Systems General: Denies fevers/chills HEENT:  Denies oral lesions Cardiovascular: Denies chest pain/palpitations Pulmonary: Denies shortness of breath Gastroenterological: See HPI Genitourinary: Denies darkened urine Hematological: Positive for easy bruising/bleeding due to anticoagulation Dermatological: Denies jaundice Psychological: Mood is stable   Medications Current Outpatient Medications  Medication Sig Dispense Refill    apixaban (ELIQUIS) 2.5 MG TABS tablet Take 1 tablet (2.5 mg total) by mouth 2 (two) times daily. 60 tablet 3   atorvastatin (LIPITOR) 10 MG tablet Take 10 mg by mouth daily.     Cholecalciferol (VITAMIN D3) 1000 units CAPS Take 1,000 Units by mouth daily.     DULoxetine (CYMBALTA) 30 MG capsule Take 1 capsule by mouth daily.     FERREX 150 150 MG capsule Take 150 mg by mouth daily.   1   gabapentin (NEURONTIN) 300 MG capsule Take 300 mg by mouth at bedtime. For pain/sleep     levothyroxine (SYNTHROID, LEVOTHROID) 50 MCG tablet Take 50 mcg by mouth daily before breakfast.     metoprolol succinate (TOPROL-XL) 200 MG 24 hr tablet Take 1 tablet (200 mg total) by mouth daily. Take with or immediately following a meal. 30 tablet 0   nitroGLYCERIN (NITROSTAT) 0.4 MG SL tablet Place 1 tablet (0.4 mg total) under the tongue every 5 (five) minutes as needed for chest pain. 25 tablet 3   pantoprazole (PROTONIX) 40 MG tablet Take 1 tablet (40 mg total) by mouth 2 (two) times daily. 60 tablet 4   vitamin C (ASCORBIC ACID) 500 MG tablet Take 500 mg by mouth 2 (two) times daily.      No current facility-administered medications for this visit.     Allergies No Known Allergies  Histories Past Medical History:  Diagnosis Date   Anxiety    CAD (coronary artery disease)    CKD (chronic kidney disease)    Gastro-esophageal reflux disease without esophagitis    Hypothyroidism    Ischemic cardiomyopathy    Jaundice 03/2019   Mild dilation of ascending aorta (HCC)    Moderate protein-calorie malnutrition (HCC)    Permanent atrial fibrillation (HCC)    Severe episode of recurrent major depressive disorder, without psychotic features ([place])    Symptomatic bradycardia    Past Surgical History:  Procedure Laterality Date   BILIARY DILATION  04/14/2019   Procedure: BILIARY DILATION;  Surgeon: Rush Landmark, Telford Nab., MD;  Location: [place];  Service: Gastroenterology;;   BILIARY  STENT PLACEMENT  04/14/2019   Procedure: BILIARY STENT PLACEMENT;  Surgeon: Irving Copas., MD;  Location: [place];  Service: Gastroenterology;;   BIOPSY  04/14/2019   Procedure: BIOPSY;  Surgeon: Irving Copas., MD;  Location: [place];  Service: Gastroenterology;;   CESAREAN SECTION     COLONOSCOPY N/A 03/26/2019   Procedure: COLONOSCOPY;  Surgeon: Daneil Dolin, MD;  [facility];  Service: Endoscopy;  Laterality: N/A;   CORONARY ARTERY BYPASS GRAFT  1970   1970s when living in [place]   ERCP N/A 04/14/2019   Procedure: ENDOSCOPIC RETROGRADE CHOLANGIOPANCREATOGRAPHY (ERCP);  Surgeon: Irving Copas., MD;  Location: [place];  Service: Gastroenterology;  Laterality: N/A;   ESOPHAGOGASTRODUODENOSCOPY (EGD) WITH PROPOFOL N/A 03/26/2019   Procedure: ESOPHAGOGASTRODUODENOSCOPY (EGD) WITH PROPOFOL;  Surgeon: Daneil Dolin, MD;  [facility];  Service: Endoscopy;  Laterality: N/A;   ESOPHAGOGASTRODUODENOSCOPY (EGD) WITH PROPOFOL N/A 04/14/2019   Procedure: ESOPHAGOGASTRODUODENOSCOPY (EGD) WITH PROPOFOL;  Surgeon: Rush Landmark Telford Nab., MD;  Location: [place];  Service: Gastroenterology;  Laterality: N/A;   EUS N/A 04/14/2019  Procedure: UPPER ENDOSCOPIC ULTRASOUND (EUS) LINEAR;  Surgeon: Rush Landmark Telford Nab., MD;  Location: [place];  Service: Gastroenterology;  Laterality: N/A;   HEMOSTASIS CLIP PLACEMENT  04/14/2019   Procedure: HEMOSTASIS CLIP PLACEMENT;  Surgeon: Irving Copas., MD;  Location: [place];  Service: Gastroenterology;;   OTHER SURGICAL HISTORY     cesearan section    PACEMAKER GENERATOR CHANGE  06/15/2017   [employer] Accolade SR L [id number] ([place] [id number]) implanted by Dr Ernestina Patches at [facility] in [place] for symptomatic bradycardia   PACEMAKER IMPLANT  11/16/2006   PPM implanted for bradycardia   PANCREATIC STENT PLACEMENT  04/14/2019    Procedure: PANCREATIC STENT PLACEMENT;  Surgeon: Irving Copas., MD;  Location: [place];  Service: Gastroenterology;;   REMOVAL OF STONES  04/14/2019   Procedure: REMOVAL OF STONES;  Surgeon: Irving Copas., MD;  Location: [place];  Service: Gastroenterology;;   Joan Mayans  04/14/2019   Procedure: Joan Mayans;  Surgeon: Irving Copas., MD;  Location: [facility];  Service: Gastroenterology;;   Social History   Socioeconomic History   Marital status: Widowed    Spouse name: Not on file   Number of children: Not on file   Years of education: Not on file   Highest education level: Not on file  Occupational History   Not on file  Social Needs   Financial resource strain: Not on file   Food insecurity    Worry: Not on file    Inability: Not on file   Transportation needs    Medical: Not on file    Non-medical: Not on file  Tobacco Use   Smoking status: Current Every Day Smoker    Packs/day: 0.50    Types: Cigarettes   Smokeless tobacco: Never Used  Substance and Sexual Activity   Alcohol use: Never    Frequency: Never   Drug use: Never   Sexual activity: Not on file  Lifestyle   Physical activity    Days per week: Not on file    Minutes per session: Not on file   Stress: Not on file  Relationships   Social connections    Talks on phone: Not on file    Gets together: Not on file    Attends religious service: Not on file    Active member of club or organization: Not on file    Attends meetings of clubs or organizations: Not on file    Relationship status: Not on file   Intimate partner violence    Fear of current or ex partner: Not on file    Emotionally abused: Not on file    Physically abused: Not on file    Forced sexual activity: Not on file  Other Topics Concern   Not on file  Social History Narrative   Lives with daughter in [place]   Family History  Problem Relation Age of Onset   Heart disease  Mother    Hypertension Mother    Diabetes Mother    Hypertension Father    Heart disease Father    Hypertension Brother    Colon cancer Neg Hx    Esophageal cancer Neg Hx    Inflammatory bowel disease Neg Hx    Pancreatic cancer Neg Hx    Stomach cancer Neg Hx    Rectal cancer Neg Hx    I have reviewed her medical, social, and family history in detail and updated the electronic medical record as necessary.    PHYSICAL  EXAMINATION  BP 104/60    Pulse 98    Temp (!) 97.2 F (36.2 C)    Ht 5' 5"  (1.651 m)    Wt 114 lb (51.7 kg)    BMI 18.97 kg/m  Wt Readings from Last 3 Encounters:  05/01/19 114 lb (51.7 kg)  04/24/19 113 lb (51.3 kg)  04/18/19 110 lb 8 oz (50.1 kg)  GEN: NAD, appears stated age, doesn't appear chronically ill, accompanied by daughter PSYCH: Cooperative, without pressured speech EYE: Conjunctivae pink, sclerae anicteric ENT: MMM CV: Irregularly irregular without rubs or gallops RESP: No adventitious breath sounds present GI: NABS, soft, NT/ND, without rebound or guarding, no HSM appreciated MSK/EXT: No lower extremity edema SKIN: No jaundice, no spider angiomata NEURO:  Alert & Oriented x 3, no focal deficits, no evidence of asterixis   REVIEW OF DATA  I reviewed the following data at the time of this encounter:  GI Procedures and Studies  10/20 EUS EGD Impression: - No gross mucosal lesions in esophagus. 5 cm hiatal hernia. - Erythematous mucosa in the gastric body and antrum. Biopsied. - Non-bleeding duodenal ulcer with a clean ulcer base (Forrest Class III). No other gross lesions in the first portion of the duodenum and in the second portion of the duodenum. - Ampulla was prominent and floppy. EUS Impression: - There was dilation in the common bile duct and in the common hepatic duct which measured up to 12 mm. - Hyperechoic material consistent with sludge was visualized endosonographically in the common bile duct. - Hyperechoic material  consistent with sludge was visualized endosonographically in the gallbladder as well as significant dilation. - Pancreatic parenchymal abnormalities consisting of atrophy and hyperechoic strands were noted in the pancreatic head, genu of the pancreas, pancreatic body and pancreatic tail. - The pancreatic duct had a dilated endosonographic appearance in the main pancreatic duct. - No malignant-appearing lymph nodes were visualized in the celiac region (level 20), peripancreatic region and porta hepatis region.  10/20 ERCP - The major papilla appeared to be floppy. - Difficult cannulation, but after double wire technique was able to cannulate the biliary tree. - The fluoroscopic examination was suspicious for sludge/stones (noted on EUS done before). - Choledocholithiasis and sludge was found. Removal was accomplished by biliary sphincterotomy and sphincteroplasty. - During balloon sweep, a perforation of the bile duct and duodenum was noted above the previously dilated distal CBD as sphincteroplasty. A biliary leak was noted on fluoroscopy. - Covered metal biliary stents placed to cover the presumed leak/perforation. - Four hemostatic clips were successfully placed (MR conditional) in the area surrounding the papilla in effort of closing the defect further. - OG tube placement was successfully performed.  Laboratory Studies  Reviewed those in epic  Imaging Studies  March 22, 2019 CT abdomen pelvis with contrast IMPRESSION: 1. No evidence of significant acute traumatic injury to the abdomen or pelvis. 2. Mild intra and extrahepatic biliary ductal dilatation. No choledocholithiasis. No definite pancreatic head mass. These findings could simply be age related, however, if there is any biochemical evidence of biliary tract obstruction, further evaluation with abdominal MRI with and without IV gadolinium with MRCP should be considered to exclude the possibility of small ampullary  neoplasm. 3. Cardiomegaly. 4. Aortic atherosclerosis.  October 29 right upper quadrant ultrasound IMPRESSION: 1. Distended gallbladder containing sludge. No sonographic findings of acute cholecystitis. 2. Dilated common bile duct. No stone identified in the visualized portion of the CBD. The central CBD at the head of the pancreas  is suboptimally visualized. 3.  Aortic Atherosclerosis (ICD10-I70.0).  April 14, 2019 IMPRESSION: 1. No free fluid in the abdomen or pelvis to suggest bile leak. 2. Common bile duct stent as above. 3. Hepatic steatosis. 4. Atherosclerotic changes in the abdominal aorta.  November 2 upper GI series IMPRESSION: 1. No evidence of a perforation/leak. 2. Negative exam. No masses, strictures or evidence of inflammation.   ASSESSMENT  Ms. Olaes is a 85 y.o. female with a pmh significant for pAFib (on Eliquis and status post PPM), CAD, chronic renal insufficiency, GERD, hypothyroidism, MDD, choledocholithiasis, status post recent ERCP with FCSEMS in place after concern for possible bile duct leak/perforation (though negative on imaging and workup), needs Gallbladder resection.  The patient is seen today for evaluation and management of:  1. Choledocholithiasis   2. History of biliary duct stent placement   3. History of ERCP   4. Gallbladder disease   5. History of anemia   6. Dilated pancreatic duct   7. Abnormal LFTs    Today, the patient returns for a scheduled follow-up post recent hospitalization.  She is hemodynamically and clinically stable.  She has done exceedingly well post her hospitalization.  As noted above we were concerned about the possibility of an underlying bile leak from a potential bile duct perforation which we sealed off with a fully covered self-expanding metal stents.  Subsequently did well without any evidence of any imaging of leak or air.  I suspect after further review this was most likely a large portion of an intravascular  duodenal portion that opened and that should have just proceeded with a large sphincterotomy and we may not have had any issues.  With that being said she now has her stents in place.  She also had a prophylactic pancreatic stent that we need to evaluate with KUB to ensure that it is fallen out otherwise she will need an EGD.  I will keep her fully covered self-expanding metal stents for at least 8 weeks so that we can ensure that the area is completely sealed in case there was a small leak.  She has not seen surgery in follow-up as has been the plan because she does need her gallbladder removed.  I will place a referral so that she can be reevaluated for gallbladder resection.  Ideally she get this done before the fully covered bile duct stents are removed so that we decrease the risk of cholecystitis since I did not see the gallbladder filled after I placed a fully covered self-expanding metal stents.  She had a lot of gallbladder sludge and so I am concerned about the possibility of her developing cholecystitis in the future.  I do not want her to get a percutaneous drain and she could get a cholecystectomy first.  I can deal with the fully covered self-expanding metal stent at any time point.  Thus we will go ahead and place a referral to surgery.  I discussed with the patient and her daughter that her pancreas duct was slightly dilated but I do not see any mass or lesion.  Presume that this is result of the significant biliary ductal dilation to develop as a result of the choledocholithiasis and biliary sludge.  With that being said small chance of having an underlying mass must remain in our thoughts and we will see how things look after she ends up having her biliary tract will completely worked on post stent removal and post cholecystectomy.  She will continue her PPI.  The risks of an ERCP were discussed at length, including but not limited to the risk of perforation, bleeding, abdominal pain, post-ERCP  pancreatitis (while usually mild can be severe and even life threatening).  The risks and benefits of endoscopic evaluation were discussed with the patient; these include but are not limited to the risk of perforation, infection, bleeding, missed lesions, lack of diagnosis, severe illness requiring hospitalization, as well as anesthesia and sedation related illnesses.  The patient is agreeable to proceed.  We will get LFTs today and some labs to see how she is doing including with her underlying anemia.  All patient questions were answered, to the best of my ability, and the patient agrees to the aforementioned plan of action with follow-up as indicated.   PLAN  Laboratories as outlined below Patient needs a KUB (previously ordered needs to be done so that we can ensure the pancreas duct stent has fallen out otherwise she will need an endoscopy for removal) Surgery referral to have cholecystectomy performed ideally before bile duct stents are removed ERCP to be done 2 months from prior so end of December or beginning of January (will need to be off Eliquis for 1 to 2 days) Continue PPI twice daily   Orders Placed This Encounter  Procedures   CBC   Comp Met (CMET)   IBC + Ferritin   B12   Folate    New Prescriptions   No medications on file   Modified Medications   Modified Medication Previous Medication   PANTOPRAZOLE (PROTONIX) 40 MG TABLET pantoprazole (PROTONIX) 40 MG tablet      Take 1 tablet (40 mg total) by mouth 2 (two) times daily.    Take 1 tablet (40 mg total) by mouth 2 (two) times daily.    Planned Follow Up No follow-ups on file.   Justice Britain, MD [facility] Office # [phone]

## 2019-05-02 NOTE — Telephone Encounter (Signed)
Request for surgical clearance:     Endoscopy Procedure  What type of surgery is being performed?     ERCP  When is this surgery scheduled?     TBD  What type of clearance is required ?   Pharmacy  Are there any medications that need to be held prior to surgery and how long? Hold Eliquis x2 days before procedure  Practice name and name of physician performing surgery?      [facility]  What is your office phone and fax number?      Phone- [phone]  [phone]  Anesthesia type (None, local, MAC, general) ?       MAC

## 2019-05-02 NOTE — Telephone Encounter (Signed)
Pre-op team:  Please add preoperative clearance to the comments for her upcoming appointment with Dr. Bronson Ing 05/24/2019.   Once this is complete, will remove from pre-op pool   Thank you  Sharee Pimple

## 2019-05-02 NOTE — Telephone Encounter (Signed)
[name]) informed. Lab was unable to get blood yesterday, so pt request to also have labs done at [facility] at the same time as KUB. [name] voiced understanding and will take pt next week for labs and KUB.

## 2019-05-02 NOTE — Progress Notes (Signed)
Pt request to have labs and KUB done at [facility]

## 2019-05-02 NOTE — Telephone Encounter (Signed)
Noted 12/10 Dr. Bronson Ing appt surgery clearance is needed. I will forward clearance notes to MD for appt. I will remove from the pre op call back pool.

## 2019-05-02 NOTE — Telephone Encounter (Signed)
Pt takes Eliquis for afib with CHADS2VASc score of 4 (age x2, sex, CAD). SCr 0.67. Ok to hold Eliquis for 1-2 days prior to procedure.

## 2019-05-02 NOTE — Telephone Encounter (Signed)
-----   Message from Irving Copas., MD sent at 05/02/2019  5:09 AM EST ----- Regarding: Follow-up Angelica Hernandez,The patient needs a KUB (orders in place) to evaluate for the pancreatic duct stent and ensure that it has fallen out.  If not I will be able to wait until January for her ERCP because I want to make sure that stent has fallen out.Can you reach out to them and see if she can get it done and I am okay but is on any of the colon facilities including anywhere [facility].She is also on Eliquis so before ERCP we will have to get cardiology approval to come off the Eliquis and I do not think I told you about that yesterday.Thanks.GM

## 2019-05-03 ENCOUNTER — Encounter (HOSPITAL_COMMUNITY): Payer: Self-pay

## 2019-05-03 ENCOUNTER — Telehealth: Payer: Self-pay | Admitting: Cardiovascular Disease

## 2019-05-03 ENCOUNTER — Ambulatory Visit (HOSPITAL_COMMUNITY): Admit: 2019-05-03 | Payer: Medicare Other | Admitting: Gastroenterology

## 2019-05-03 SURGERY — UPPER ENDOSCOPIC ULTRASOUND (EUS) RADIAL
Anesthesia: Monitor Anesthesia Care

## 2019-05-03 NOTE — Telephone Encounter (Signed)
Per voicemail from [place] @ Dr. Melissa Montane office at [facility]-- pt's surgery is scheduled on 05/21/2019-   Will need to be seen before 05/24/2019   Surgical clearance was faxed from [facility]

## 2019-05-03 NOTE — Telephone Encounter (Signed)
Arrange for vv with app

## 2019-05-03 NOTE — Telephone Encounter (Signed)
Apt made for 12/1 at 230 pm with Bernerd Pho, PA-C

## 2019-05-08 DIAGNOSIS — K8020 Calculus of gallbladder without cholecystitis without obstruction: Secondary | ICD-10-CM | POA: Diagnosis not present

## 2019-05-15 ENCOUNTER — Ambulatory Visit: Payer: Medicare Other | Admitting: Student

## 2019-05-16 NOTE — Progress Notes (Signed)
[facility] [phone] - [place], [place] AT [address] [place] [address] Phone: [phone] Fax: [phone]  [facility] [place], [place] - [address] [place] [address] Phone: [phone] Fax: [phone]    Your procedure is scheduled on Monday, December 7th.  Report to [facility] Main Entrance "A" at 8:00 A.M., and check in at the Admitting office.  Call this number if you have problems the morning of surgery:  [phone]  Call [phone] if you have any questions prior to your surgery date Monday-Friday 8am-4pm   Remember:  Do not eat or drink after midnight the night before your surgery   Take these medicines the morning of surgery with A SIP OF WATER  atorvastatin (LIPITOR)  DULoxetine (CYMBALTA) lansoprazole (PREVACID)  levothyroxine (SYNTHROID, LEVOTHROID)  metoprolol succinate (TOPROL-XL)  pantoprazole (PROTONIX)   If needed - nitroGLYCERIN (NITROSTAT), Polyethyl Glycol-Propyl Glycol (SYSTANE OP)/eye drops  As of today, STOP taking any Aspirin (unless otherwise instructed by your surgeon), Aleve, Naproxen, Ibuprofen, Motrin, Advil, Goody's, BC's, all herbal medications, fish oil, and all vitamins.   The Morning of Surgery  Do not wear jewelry, make-up or nail polish.  Do not wear lotions, powders, or perfumes, or deodorant  Do not shave 48 hours prior to surgery.    Do not bring valuables to the hospital.  [facility] is not responsible for any belongings or valuables.  If you are a smoker, DO NOT Smoke 24 hours prior to surgery  If you wear a CPAP at night please bring your mask, tubing, and machine the morning of surgery   Remember that you must have someone to transport you home after your surgery, and remain with you for 24 hours if you are discharged the same day.  Please bring cases for contacts, glasses, hearing aids, dentures  or bridgework because it cannot be worn into surgery.   Leave your suitcase in the car.  After surgery it may be brought to your room.  For patients admitted to the hospital, discharge time will be determined by your treatment team.  Patients discharged the day of surgery will not be allowed to drive home.   Special instructions:   [facility]- Preparing For Surgery  Before surgery, you can play an important role. Because skin is not sterile, your skin needs to be as free of germs as possible. You can reduce the number of germs on your skin by washing with CHG (chlorahexidine gluconate) Soap before surgery.  CHG is an antiseptic cleaner which kills germs and bonds with the skin to continue killing germs even after washing.    Oral Hygiene is also important to reduce your risk of infection.  Remember - BRUSH YOUR TEETH THE MORNING OF SURGERY WITH YOUR REGULAR TOOTHPASTE  Please do not use if you have an allergy to CHG or antibacterial soaps. If your skin becomes reddened/irritated stop using the CHG.  Do not shave (including legs and underarms) for at least 48 hours prior to first CHG shower. It is OK to shave your face.  Please follow these instructions carefully.   1. Shower the NIGHT BEFORE SURGERY and the MORNING OF SURGERY with CHG Soap.   2. If you chose to wash your hair, wash your hair first as usual with your normal shampoo.  3. After you shampoo, rinse your hair and body thoroughly  to remove the shampoo.  4. Use CHG as you would any other liquid soap. You can apply CHG directly to the skin and wash gently with a scrungie or a clean washcloth.   5. Apply the CHG Soap to your body ONLY FROM THE NECK DOWN.  Do not use on open wounds or open sores. Avoid contact with your eyes, ears, mouth and genitals (private parts). Wash Face and genitals (private parts)  with your normal soap.   6. Wash thoroughly, paying special attention to the area where your surgery will be  performed.  7. Thoroughly rinse your body with warm water from the neck down.  8. DO NOT shower/wash with your normal soap after using and rinsing off the CHG Soap.  9. Pat yourself dry with a CLEAN TOWEL.  10. Wear CLEAN PAJAMAS to bed the night before surgery, wear comfortable clothes the morning of surgery  11. Place CLEAN SHEETS on your bed the night of your first shower and DO NOT SLEEP WITH PETS.  Day of Surgery: Please shower the morning of surgery with the CHG soap Do not apply any deodorants/lotions. Please wear clean clothes to the hospital/surgery center.   Remember to brush your teeth WITH YOUR REGULAR TOOTHPASTE.  Please read over the following fact sheets that you were given.

## 2019-05-17 ENCOUNTER — Other Ambulatory Visit (HOSPITAL_COMMUNITY): Payer: Medicare Other

## 2019-05-17 ENCOUNTER — Other Ambulatory Visit: Payer: Self-pay

## 2019-05-17 ENCOUNTER — Encounter (HOSPITAL_COMMUNITY)
Admission: RE | Admit: 2019-05-17 | Discharge: 2019-05-17 | Disposition: A | Discharge status: Home / Self Care | Payer: Medicare Other | Source: Ambulatory Visit | Attending: General Surgery | Admitting: General Surgery

## 2019-05-17 ENCOUNTER — Other Ambulatory Visit (HOSPITAL_COMMUNITY)
Admission: RE | Admit: 2019-05-17 | Discharge: 2019-05-17 | Disposition: A | Discharge status: Home / Self Care | Payer: Medicare Other | Source: Ambulatory Visit | Attending: General Surgery | Admitting: General Surgery

## 2019-05-17 ENCOUNTER — Encounter (HOSPITAL_COMMUNITY): Payer: Self-pay

## 2019-05-17 DIAGNOSIS — I083 Combined rheumatic disorders of mitral, aortic and tricuspid valves: Secondary | ICD-10-CM | POA: Insufficient documentation

## 2019-05-17 DIAGNOSIS — Z7901 Long term (current) use of anticoagulants: Secondary | ICD-10-CM | POA: Insufficient documentation

## 2019-05-17 DIAGNOSIS — Z20828 Contact with and (suspected) exposure to other viral communicable diseases: Secondary | ICD-10-CM | POA: Insufficient documentation

## 2019-05-17 DIAGNOSIS — I4821 Permanent atrial fibrillation: Secondary | ICD-10-CM | POA: Insufficient documentation

## 2019-05-17 DIAGNOSIS — Z01812 Encounter for preprocedural laboratory examination: Secondary | ICD-10-CM | POA: Diagnosis present

## 2019-05-17 DIAGNOSIS — Z950 Presence of cardiac pacemaker: Secondary | ICD-10-CM | POA: Diagnosis not present

## 2019-05-17 DIAGNOSIS — K8080 Other cholelithiasis without obstruction: Secondary | ICD-10-CM | POA: Diagnosis not present

## 2019-05-17 DIAGNOSIS — I25810 Atherosclerosis of coronary artery bypass graft(s) without angina pectoris: Secondary | ICD-10-CM | POA: Diagnosis not present

## 2019-05-17 HISTORY — DX: Presence of cardiac pacemaker: Z95.0

## 2019-05-17 HISTORY — DX: Cardiac arrhythmia, unspecified: I49.9

## 2019-05-17 LAB — BASIC METABOLIC PANEL
Anion gap: 8 (ref 5–15)
BUN: 9 mg/dL (ref 8–23)
CO2: 25 mmol/L (ref 22–32)
Calcium: 8.8 mg/dL — ABNORMAL LOW (ref 8.9–10.3)
Chloride: 106 mmol/L (ref 98–111)
Creatinine, Ser: 0.58 mg/dL (ref 0.44–1.00)
GFR calc Af Amer: >60 mL/min (ref >60)
GFR calc non Af Amer: >60 mL/min (ref >60)
Glucose, Bld: 86 mg/dL (ref 70–99)
Potassium: 3.6 mmol/L (ref 3.5–5.1)
Sodium: 139 mmol/L (ref 135–145)

## 2019-05-17 LAB — CBC
HCT: 37.6 % (ref 36.0–46.0)
Hemoglobin: 11.6 g/dL — ABNORMAL LOW (ref 12.0–15.0)
MCH: 28.1 pg (ref 26.0–34.0)
MCHC: 30.9 g/dL (ref 30.0–36.0)
MCV: 91 fL (ref 80.0–100.0)
Platelets: 324 10*3/uL (ref 150–400)
RBC: 4.13 MIL/uL (ref 3.87–5.11)
RDW: 14.5 % (ref 11.5–15.5)
WBC: 9.8 10*3/uL (ref 4.0–10.5)
nRBC: 0 % (ref 0.0–0.2)

## 2019-05-17 NOTE — Progress Notes (Signed)
PCP - DR Coralyn Mark Cardiologist - DR Bronson Ing     CLEARANCE DONE  PPM/ICD - PACEMAKER Device Orders - FAXED [place] Rep Notified - [place]  Chest x-ray - 10/20 EKG - 10/20 Stress Test - NA ECHO - 9/19 Cardiac Cath -   CABG IN 1983 IN [place]   Sleep Study - NA CPAP - NA     Aspirin Instructions:STOP   -   COVID TEST- TODAY   Anesthesia review: HEART HX  Patient denies shortness of breath, fever, cough and chest pain at PAT appointment   All instructions explained to the patient, with a verbal understanding of the material. Patient agrees to go over the instructions while at home for a better understanding. Patient also instructed to self quarantine after being tested for COVID-19. The opportunity to ask questions was provided.

## 2019-05-18 NOTE — Anesthesia Preprocedure Evaluation (Addendum)
Anesthesia Evaluation  Patient identified by MRN, date of birth, ID band Patient awake    Reviewed: Allergy & Precautions, NPO status , Patient's Chart, lab work & pertinent test results  Airway Mallampati: II  TM Distance: >3 FB Neck ROM: Full  Mouth opening: Limited Mouth Opening  Dental no notable dental hx. (+) Edentulous Lower, Edentulous Upper   Pulmonary neg pulmonary ROS, Current Smoker and Patient abstained from smoking.,    Pulmonary exam normal breath sounds clear to auscultation       Cardiovascular + CAD and + CABG (1980s)  Normal cardiovascular exam+ dysrhythmias (on eliquis) Atrial Fibrillation + pacemaker (SSS)  Rhythm:Regular Rate:Normal  EKG 04/13/19 (during recent admission for Biliary ductal dilatation/choledocholithiasis with cholelithiasis and obstructive jaundice: Status post ERCP and biliary stenting.): Atrial fibrillation with rapid ventricular response. Vent rate 118. ST & T wave abnormality, consider inferior ischemia. ST & T wave abnormality, consider anterolateral ischemia  TTE 02/22/18: - Left ventricle: The cavity size was normal. Wall thickness was normal. Systolic function was mildly reduced. The estimatedejection fraction was in the range of 45% to 50%. Diffuse hypokinesis. Abnormal global longitudinal strain of -12.2%. Thestudy was not technically sufficient to allow evaluation of LV diastolic dysfunction due to atrial fibrillation. - Aortic valve: Mildly calcified annulus. Trileaflet. - Mitral valve: Mildly calcified annulus. There was mild regurgitation. - Right ventricle: Pacer wire or catheter noted in right ventricle. - Right atrium: Pacer wire or catheter noted in right atrium. - Atrial septum: No defect or patent foramen ovale was identified. - Tricuspid valve: There was moderate regurgitation. - Pulmonary arteries: PA peak pressure: 35 mm Hg (S). - Pericardium, extracardiac: There was no  pericardial effusion.   Neuro/Psych PSYCHIATRIC DISORDERS Anxiety Depression negative neurological ROS     GI/Hepatic Neg liver ROS, GERD  ,  Endo/Other  Hypothyroidism   Renal/GU Renal InsufficiencyRenal disease  negative genitourinary   Musculoskeletal negative musculoskeletal ROS (+)   Abdominal   Peds  Hematology  (+) Blood dyscrasia, ,   Anesthesia Other Findings Afib with RVR after ERCP 03/2019, cardizem infusion started. Currently just on metoprolol for rate control 2/2 hypotension  Reproductive/Obstetrics                           Anesthesia Physical Anesthesia Plan  ASA: III  Anesthesia Plan: General   Post-op Pain Management:    Induction: Intravenous  PONV Risk Score and Plan: 2 and Dexamethasone and Ondansetron  Airway Management Planned: Oral ETT  Additional Equipment:   Intra-op Plan:   Post-operative Plan: Extubation in OR  Informed Consent: I have reviewed the patients History and Physical, chart, labs and discussed the procedure including the risks, benefits and alternatives for the proposed anesthesia with the patient or authorized representative who has indicated his/her understanding and acceptance.     Dental advisory given  Plan Discussed with: CRNA  Anesthesia Plan Comments:        Anesthesia Quick Evaluation

## 2019-05-18 NOTE — Progress Notes (Signed)
Anesthesia Chart Review:  Follows with cardiology for history of CAD status post CABG in 1980s, sick sinus syndrome status post pacemaker, permanent atrial fibrillation.  Echo 02/2018 LVEF 45 to 50%. Last seen 04/24/19. Per OV note "Persistent atrial fibrillation with elevated rates after undergoing ERCP.  Patient's diltiazem has been on hold because of low blood pressures.  Metoprolol was changed to succinate to help with rate control.  Coumadin was changed to Eliquis.  Overall patient is doing well on Eliquis and metoprolol.  Heart rates controlled today.  Blood pressure is quite low so cannot start diltiazem back.  Scheduled for gallbladder surgery at some point this month. I have asked her to contact Dr. Bronson Ing concerning holding Eliquis before surgery." Dr. Donne Hazel reached out to Dr. Burke Keels directly regarding clearance. Dr. Bronson Ing advised pt could proceed with surgery without further cardiac eval, can hold Eliquis 48hr prior to surgery. Copy of communication on chart.   Follows with Dr. Rayann Heman for symptomatic bradycardia s/p Bos Sci PPM. Last seen 02/16/19, normal function, not PPM dependent at that time.  Cardiac device form faxed, chart left for nurse followup regarding receipt of instructions.   Preop labs reviewed, unremarkable.   EKG 04/13/19 (during recent admission for Biliary ductal dilatation/choledocholithiasis with cholelithiasis and obstructive jaundice: Status post ERCP and biliary stenting.): Atrial fibrillation with rapid ventricular response. Vent rate 118. ST & T wave abnormality, consider inferior ischemia. ST & T wave abnormality, consider anterolateral ischemia  TTE 02/22/18: - Left ventricle: The cavity size was normal. Wall thickness was   normal. Systolic function was mildly reduced. The estimated   ejection fraction was in the range of 45% to 50%. Diffuse   hypokinesis. Abnormal global longitudinal strain of -12.2%. The   study was not technically sufficient to  allow evaluation of LV   diastolic dysfunction due to atrial fibrillation. - Aortic valve: Mildly calcified annulus. Trileaflet. - Mitral valve: Mildly calcified annulus. There was mild   regurgitation. - Right ventricle: Pacer wire or catheter noted in right ventricle. - Right atrium: Pacer wire or catheter noted in right atrium. - Atrial septum: No defect or patent foramen ovale was identified. - Tricuspid valve: There was moderate regurgitation. - Pulmonary arteries: PA peak pressure: 35 mm Hg (S). - Pericardium, extracardiac: There was no pericardial effusion.   Wynonia Musty [facility] Short Stay Center/Anesthesiology Phone [phone] 05/18/2019 10:48 AM

## 2019-05-20 LAB — NOVEL CORONAVIRUS, NAA (HOSP ORDER, SEND-OUT TO REF LAB; TAT 18-24 HRS): SARS-CoV-2, NAA: NOT DETECTED

## 2019-05-21 ENCOUNTER — Encounter (HOSPITAL_COMMUNITY): Payer: Self-pay | Admitting: Orthopedic Surgery

## 2019-05-21 ENCOUNTER — Ambulatory Visit (HOSPITAL_COMMUNITY): Payer: Medicare Other | Admitting: Anesthesiology

## 2019-05-21 ENCOUNTER — Observation Stay (HOSPITAL_COMMUNITY)
Admission: RE | Admit: 2019-05-21 | Discharge: 2019-05-22 | Disposition: A | Discharge status: Home / Self Care | Payer: Medicare Other | Attending: General Surgery | Admitting: General Surgery

## 2019-05-21 ENCOUNTER — Encounter (HOSPITAL_COMMUNITY)
Admission: RE | Disposition: A | Discharge status: Home / Self Care | Payer: Self-pay | Source: Home / Self Care | Attending: General Surgery

## 2019-05-21 ENCOUNTER — Other Ambulatory Visit: Payer: Self-pay

## 2019-05-21 ENCOUNTER — Ambulatory Visit (HOSPITAL_COMMUNITY): Payer: Medicare Other | Admitting: Physician Assistant

## 2019-05-21 DIAGNOSIS — Z79890 Hormone replacement therapy (postmenopausal): Secondary | ICD-10-CM | POA: Insufficient documentation

## 2019-05-21 DIAGNOSIS — Z951 Presence of aortocoronary bypass graft: Secondary | ICD-10-CM | POA: Diagnosis not present

## 2019-05-21 DIAGNOSIS — Z8541 Personal history of malignant neoplasm of cervix uteri: Secondary | ICD-10-CM | POA: Diagnosis not present

## 2019-05-21 DIAGNOSIS — Z79899 Other long term (current) drug therapy: Secondary | ICD-10-CM | POA: Insufficient documentation

## 2019-05-21 DIAGNOSIS — I495 Sick sinus syndrome: Secondary | ICD-10-CM | POA: Diagnosis not present

## 2019-05-21 DIAGNOSIS — I2510 Atherosclerotic heart disease of native coronary artery without angina pectoris: Secondary | ICD-10-CM | POA: Diagnosis not present

## 2019-05-21 DIAGNOSIS — Z8249 Family history of ischemic heart disease and other diseases of the circulatory system: Secondary | ICD-10-CM | POA: Diagnosis not present

## 2019-05-21 DIAGNOSIS — J439 Emphysema, unspecified: Secondary | ICD-10-CM | POA: Diagnosis not present

## 2019-05-21 DIAGNOSIS — F329 Major depressive disorder, single episode, unspecified: Secondary | ICD-10-CM | POA: Diagnosis not present

## 2019-05-21 DIAGNOSIS — I4821 Permanent atrial fibrillation: Secondary | ICD-10-CM

## 2019-05-21 DIAGNOSIS — E039 Hypothyroidism, unspecified: Secondary | ICD-10-CM | POA: Diagnosis not present

## 2019-05-21 DIAGNOSIS — K219 Gastro-esophageal reflux disease without esophagitis: Secondary | ICD-10-CM | POA: Insufficient documentation

## 2019-05-21 DIAGNOSIS — K811 Chronic cholecystitis: Secondary | ICD-10-CM | POA: Diagnosis not present

## 2019-05-21 DIAGNOSIS — F17210 Nicotine dependence, cigarettes, uncomplicated: Secondary | ICD-10-CM | POA: Diagnosis not present

## 2019-05-21 DIAGNOSIS — N189 Chronic kidney disease, unspecified: Secondary | ICD-10-CM | POA: Insufficient documentation

## 2019-05-21 DIAGNOSIS — F419 Anxiety disorder, unspecified: Secondary | ICD-10-CM | POA: Insufficient documentation

## 2019-05-21 DIAGNOSIS — M1990 Unspecified osteoarthritis, unspecified site: Secondary | ICD-10-CM | POA: Diagnosis not present

## 2019-05-21 DIAGNOSIS — Z950 Presence of cardiac pacemaker: Secondary | ICD-10-CM | POA: Diagnosis not present

## 2019-05-21 DIAGNOSIS — Z7901 Long term (current) use of anticoagulants: Secondary | ICD-10-CM | POA: Diagnosis not present

## 2019-05-21 DIAGNOSIS — K8020 Calculus of gallbladder without cholecystitis without obstruction: Secondary | ICD-10-CM | POA: Diagnosis present

## 2019-05-21 DIAGNOSIS — I255 Ischemic cardiomyopathy: Secondary | ICD-10-CM | POA: Diagnosis not present

## 2019-05-21 DIAGNOSIS — K8050 Calculus of bile duct without cholangitis or cholecystitis without obstruction: Secondary | ICD-10-CM | POA: Diagnosis present

## 2019-05-21 DIAGNOSIS — E785 Hyperlipidemia, unspecified: Secondary | ICD-10-CM | POA: Insufficient documentation

## 2019-05-21 DIAGNOSIS — I5042 Chronic combined systolic (congestive) and diastolic (congestive) heart failure: Secondary | ICD-10-CM | POA: Insufficient documentation

## 2019-05-21 DIAGNOSIS — E7800 Pure hypercholesterolemia, unspecified: Secondary | ICD-10-CM | POA: Insufficient documentation

## 2019-05-21 HISTORY — PX: CHOLECYSTECTOMY: SHX55

## 2019-05-21 SURGERY — LAPAROSCOPIC CHOLECYSTECTOMY
Anesthesia: General | Site: Abdomen

## 2019-05-21 MED ORDER — ONDANSETRON HCL 4 MG/2ML IJ SOLN
INTRAMUSCULAR | Status: DC | PRN
  Administered 2019-05-21: 4 mg via INTRAVENOUS

## 2019-05-21 MED ORDER — ONDANSETRON HCL 4 MG/2ML IJ SOLN
INTRAMUSCULAR | Status: AC
  Filled 2019-05-21: qty 2

## 2019-05-21 MED ORDER — CEFAZOLIN SODIUM-DEXTROSE 2-3 GM-%(50ML) IV SOLR
INTRAVENOUS | Status: DC | PRN
  Administered 2019-05-21: 2 g via INTRAVENOUS

## 2019-05-21 MED ORDER — ONDANSETRON HCL 4 MG/2ML IJ SOLN: 4.0000 mg | Freq: Four times a day (QID) | INTRAMUSCULAR | Status: DC | PRN

## 2019-05-21 MED ORDER — DULOXETINE HCL 30 MG PO CPEP
30.0000 mg | ORAL_CAPSULE | Freq: Every day | ORAL | Status: DC
  Administered 2019-05-22: 30 mg via ORAL
  Filled 2019-05-21: qty 1

## 2019-05-21 MED ORDER — LIDOCAINE 2% (20 MG/ML) 5 ML SYRINGE
INTRAMUSCULAR | Status: DC | PRN
  Administered 2019-05-21: 40 mg via INTRAVENOUS

## 2019-05-21 MED ORDER — METHOCARBAMOL 500 MG PO TABS: 500.0000 mg | ORAL_TABLET | Freq: Four times a day (QID) | ORAL | Status: DC | PRN

## 2019-05-21 MED ORDER — CEFAZOLIN SODIUM-DEXTROSE 2-4 GM/100ML-% IV SOLN
INTRAVENOUS | Status: AC
  Filled 2019-05-21: qty 100

## 2019-05-21 MED ORDER — FENTANYL CITRATE (PF) 250 MCG/5ML IJ SOLN
INTRAMUSCULAR | Status: AC
  Filled 2019-05-21: qty 5

## 2019-05-21 MED ORDER — SODIUM CHLORIDE 0.9 % IR SOLN
Status: DC | PRN
  Administered 2019-05-21: 1000 mL

## 2019-05-21 MED ORDER — DILTIAZEM LOAD VIA INFUSION
INTRAVENOUS | Status: DC | PRN
  Administered 2019-05-21 (×3): 5 mg via INTRAVENOUS

## 2019-05-21 MED ORDER — ROCURONIUM BROMIDE 10 MG/ML (PF) SYRINGE
PREFILLED_SYRINGE | INTRAVENOUS | Status: AC
  Filled 2019-05-21: qty 10

## 2019-05-21 MED ORDER — PANTOPRAZOLE SODIUM 40 MG PO TBEC
40.0000 mg | DELAYED_RELEASE_TABLET | Freq: Every day | ORAL | Status: DC
  Administered 2019-05-22: 40 mg via ORAL
  Filled 2019-05-21: qty 1

## 2019-05-21 MED ORDER — ACETAMINOPHEN 500 MG PO TABS
1000.0000 mg | ORAL_TABLET | Freq: Four times a day (QID) | ORAL | Status: DC
  Administered 2019-05-21 – 2019-05-22 (×2): 1000 mg via ORAL
  Filled 2019-05-21 (×4): qty 2

## 2019-05-21 MED ORDER — BUPIVACAINE-EPINEPHRINE (PF) 0.5% -1:200000 IJ SOLN
INTRAMUSCULAR | Status: DC | PRN
  Administered 2019-05-21: 8 mL

## 2019-05-21 MED ORDER — MORPHINE SULFATE (PF) 2 MG/ML IV SOLN: 1.0000 mg | INTRAVENOUS | Status: DC | PRN

## 2019-05-21 MED ORDER — BUPIVACAINE-EPINEPHRINE (PF) 0.5% -1:200000 IJ SOLN
INTRAMUSCULAR | Status: AC
  Filled 2019-05-21: qty 30

## 2019-05-21 MED ORDER — FENTANYL CITRATE (PF) 100 MCG/2ML IJ SOLN: 25.0000 ug | INTRAMUSCULAR | Status: DC | PRN

## 2019-05-21 MED ORDER — FENTANYL CITRATE (PF) 250 MCG/5ML IJ SOLN
INTRAMUSCULAR | Status: DC | PRN
  Administered 2019-05-21 (×2): 50 ug via INTRAVENOUS

## 2019-05-21 MED ORDER — LACTATED RINGERS IV SOLN
INTRAVENOUS | Status: DC | PRN
  Administered 2019-05-21: 10:00:00 via INTRAVENOUS

## 2019-05-21 MED ORDER — 0.9 % SODIUM CHLORIDE (POUR BTL) OPTIME
TOPICAL | Status: DC | PRN
  Administered 2019-05-21: 1000 mL

## 2019-05-21 MED ORDER — DEXAMETHASONE SODIUM PHOSPHATE 10 MG/ML IJ SOLN
INTRAMUSCULAR | Status: DC | PRN
  Administered 2019-05-21: 5 mg via INTRAVENOUS

## 2019-05-21 MED ORDER — SODIUM CHLORIDE 0.9 % IV SOLN
INTRAVENOUS | Status: DC
  Administered 2019-05-21: 14:00:00 via INTRAVENOUS

## 2019-05-21 MED ORDER — LIDOCAINE 2% (20 MG/ML) 5 ML SYRINGE
INTRAMUSCULAR | Status: AC
  Filled 2019-05-21: qty 5

## 2019-05-21 MED ORDER — LACTATED RINGERS IV SOLN
INTRAVENOUS | Status: DC
  Administered 2019-05-21: 10:00:00 via INTRAVENOUS

## 2019-05-21 MED ORDER — NALOXONE HCL 0.4 MG/ML IJ SOLN
INTRAMUSCULAR | Status: DC | PRN
  Administered 2019-05-21: .04 mg via INTRAVENOUS
  Administered 2019-05-21: .02 mg via INTRAVENOUS

## 2019-05-21 MED ORDER — SUGAMMADEX SODIUM 200 MG/2ML IV SOLN
INTRAVENOUS | Status: DC | PRN
  Administered 2019-05-21: 105.6 mg via INTRAVENOUS

## 2019-05-21 MED ORDER — STERILE WATER FOR IRRIGATION IR SOLN
Status: DC | PRN
  Administered 2019-05-21: 1000 mL

## 2019-05-21 MED ORDER — ACETAMINOPHEN 500 MG PO TABS
1000.0000 mg | ORAL_TABLET | Freq: Once | ORAL | Status: AC
  Administered 2019-05-21: 09:00:00 1000 mg via ORAL
  Filled 2019-05-21: qty 2

## 2019-05-21 MED ORDER — POLYSACCHARIDE IRON COMPLEX 150 MG PO CAPS
150.0000 mg | ORAL_CAPSULE | Freq: Every day | ORAL | Status: DC
  Administered 2019-05-21 – 2019-05-22 (×2): 150 mg via ORAL
  Filled 2019-05-21 (×2): qty 1

## 2019-05-21 MED ORDER — OXYCODONE HCL 5 MG PO TABS: 5.0000 mg | ORAL_TABLET | ORAL | Status: DC | PRN

## 2019-05-21 MED ORDER — SIMETHICONE 80 MG PO CHEW
40.0000 mg | CHEWABLE_TABLET | Freq: Four times a day (QID) | ORAL | Status: DC | PRN
  Filled 2019-05-21: qty 1

## 2019-05-21 MED ORDER — METOPROLOL SUCCINATE ER 100 MG PO TB24
100.0000 mg | ORAL_TABLET | Freq: Two times a day (BID) | ORAL | Status: DC
  Administered 2019-05-21 – 2019-05-22 (×2): 100 mg via ORAL
  Filled 2019-05-21 (×2): qty 1

## 2019-05-21 MED ORDER — NITROGLYCERIN 0.4 MG SL SUBL: 0.4000 mg | SUBLINGUAL_TABLET | SUBLINGUAL | Status: DC | PRN

## 2019-05-21 MED ORDER — PROPOFOL 10 MG/ML IV BOLUS
INTRAVENOUS | Status: DC | PRN
  Administered 2019-05-21: 50 mg via INTRAVENOUS

## 2019-05-21 MED ORDER — POLYVINYL ALCOHOL 1.4 % OP SOLN: Freq: Three times a day (TID) | OPHTHALMIC | Status: DC | PRN

## 2019-05-21 MED ORDER — HEMOSTATIC AGENTS (NO CHARGE) OPTIME
TOPICAL | Status: DC | PRN
  Administered 2019-05-21 (×2): 1 via TOPICAL

## 2019-05-21 MED ORDER — LEVOTHYROXINE SODIUM 50 MCG PO TABS
50.0000 ug | ORAL_TABLET | Freq: Every day | ORAL | Status: DC
  Filled 2019-05-21: qty 1

## 2019-05-21 MED ORDER — ONDANSETRON 4 MG PO TBDP: 4.0000 mg | ORAL_TABLET | Freq: Four times a day (QID) | ORAL | Status: DC | PRN

## 2019-05-21 MED ORDER — DEXAMETHASONE SODIUM PHOSPHATE 10 MG/ML IJ SOLN
INTRAMUSCULAR | Status: AC
  Filled 2019-05-21: qty 1

## 2019-05-21 MED ORDER — ATORVASTATIN CALCIUM 10 MG PO TABS
10.0000 mg | ORAL_TABLET | Freq: Every day | ORAL | Status: DC
  Administered 2019-05-22: 10 mg via ORAL
  Filled 2019-05-21: qty 1

## 2019-05-21 MED ORDER — ROCURONIUM BROMIDE 10 MG/ML (PF) SYRINGE
PREFILLED_SYRINGE | INTRAVENOUS | Status: DC | PRN
  Administered 2019-05-21: 50 mg via INTRAVENOUS

## 2019-05-21 SURGICAL SUPPLY — 40 items
APPLIER CLIP 5 13 M/L LIGAMAX5 (MISCELLANEOUS) ×8
CANISTER SUCT 3000ML PPV (MISCELLANEOUS) ×4 IMPLANT
CHLORAPREP W/TINT 26 (MISCELLANEOUS) ×4 IMPLANT
CLIP APPLIE 5 13 M/L LIGAMAX5 (MISCELLANEOUS) ×4 IMPLANT
CLOSURE WOUND 1/2 X4 (GAUZE/BANDAGES/DRESSINGS) ×1
COVER MAYO STAND STRL (DRAPES) ×4 IMPLANT
COVER SURGICAL LIGHT HANDLE (MISCELLANEOUS) ×4 IMPLANT
DERMABOND ADVANCED (GAUZE/BANDAGES/DRESSINGS) ×2
DERMABOND ADVANCED .7 DNX12 (GAUZE/BANDAGES/DRESSINGS) ×2 IMPLANT
DRAPE C-ARM 42X120 X-RAY (DRAPES) ×4 IMPLANT
ELECT REM PT RETURN 9FT ADLT (ELECTROSURGICAL) ×4
ELECTRODE REM PT RTRN 9FT ADLT (ELECTROSURGICAL) ×2 IMPLANT
GLOVE BIO SURGEON STRL SZ7 (GLOVE) ×8 IMPLANT
GLOVE BIOGEL PI IND STRL 7.5 (GLOVE) ×2 IMPLANT
GLOVE BIOGEL PI INDICATOR 7.5 (GLOVE) ×2
GOWN STRL REUS W/ TWL LRG LVL3 (GOWN DISPOSABLE) ×6 IMPLANT
GOWN STRL REUS W/TWL LRG LVL3 (GOWN DISPOSABLE) ×6
GRASPER SUT TROCAR 14GX15 (MISCELLANEOUS) ×4 IMPLANT
HEMOSTAT SNOW SURGICEL 2X4 (HEMOSTASIS) ×8 IMPLANT
KIT BASIN OR (CUSTOM PROCEDURE TRAY) ×4 IMPLANT
KIT TURNOVER KIT B (KITS) ×4 IMPLANT
NS IRRIG 1000ML POUR BTL (IV SOLUTION) ×4 IMPLANT
PAD ARMBOARD 7.5X6 YLW CONV (MISCELLANEOUS) ×4 IMPLANT
POUCH RETRIEVAL ECOSAC 10 (ENDOMECHANICALS) ×2 IMPLANT
POUCH RETRIEVAL ECOSAC 10MM (ENDOMECHANICALS) ×2
SCISSORS LAP 5X35 DISP (ENDOMECHANICALS) ×4 IMPLANT
SET CHOLANGIOGRAPH 5 50 .035 (SET/KITS/TRAYS/PACK) IMPLANT
SET IRRIG TUBING LAPAROSCOPIC (IRRIGATION / IRRIGATOR) ×4 IMPLANT
SET TUBE SMOKE EVAC HIGH FLOW (TUBING) ×4 IMPLANT
SLEEVE ENDOPATH XCEL 5M (ENDOMECHANICALS) ×8 IMPLANT
SPECIMEN JAR SMALL (MISCELLANEOUS) ×4 IMPLANT
STRIP CLOSURE SKIN 1/2X4 (GAUZE/BANDAGES/DRESSINGS) ×3 IMPLANT
SUT MNCRL AB 4-0 PS2 18 (SUTURE) ×4 IMPLANT
SUT VICRYL 0 UR6 27IN ABS (SUTURE) ×4 IMPLANT
TOWEL GREEN STERILE (TOWEL DISPOSABLE) ×4 IMPLANT
TOWEL GREEN STERILE FF (TOWEL DISPOSABLE) ×4 IMPLANT
TRAY LAPAROSCOPIC MC (CUSTOM PROCEDURE TRAY) ×4 IMPLANT
TROCAR XCEL BLUNT TIP 100MML (ENDOMECHANICALS) ×4 IMPLANT
TROCAR XCEL NON-BLD 5MMX100MML (ENDOMECHANICALS) ×4 IMPLANT
WATER STERILE IRR 1000ML POUR (IV SOLUTION) ×4 IMPLANT

## 2019-05-21 NOTE — H&P (Signed)
85 yof seen in follow up from hospital for gallstones. She has mmp on eliquis and had choledocholithiasis treated by ercp with stent that was thought to have perforated bile duct/duo. this ended up being negative both on exam and imaging and she recovered. GI wanted to [place] to hold on cholecystectomy then. she has followed up with gi. lfts better early november. she feels better some occasional upper abdominal pains.  her previous imaging includes eus and ercp. eus didnt show a mass, did show sludge and debris with dilated biliary treee. ercp showed sludge. has ruq [place] that showed distended bt with sludge. she is here with her daughter to discuss cholecystectomy   Past Surgical History Illene Regulus, CMA; 05/08/2019 2:44 PM) Carotid Artery Surgery  Bilateral. Cesarean Section - 1  Hysterectomy (due to cancer) - Complete  Liver Surgery   Allergies Lars Mage Spillers, CMA; 05/08/2019 2:45 PM) No Known Drug Allergies [05/08/2019]:  Medication History (Alisha Spillers, CMA; 05/08/2019 2:49 PM) Pantoprazole Sodium (40MG  Tablet DR, Oral) Active. Atorvastatin Calcium (10MG  Tablet, Oral) Active. Levothyroxine Sodium (50MCG Tablet, Oral) Active. Metoprolol Succinate ER (100MG  Tablet ER 24HR, Oral) Active. Eliquis (2.5MG  Tablet, Oral) Active. Medications Reconciled  Social History Illene Regulus, CMA; 05/08/2019 2:44 PM) Alcohol use  Recently quit alcohol use. Caffeine use  Coffee. No drug use  Tobacco use  Current every day smoker.  Family History Illene Regulus, [place]; 05/08/2019 2:44 PM) Alcohol Abuse  Family Members In [place], Son. Heart Disease  Father, Mother. Heart disease in female family member before age 65  Heart disease in female family member before age 83  Hypertension  Father, Mother.  Pregnancy / Birth History Illene Regulus, CMA; 05/08/2019 2:44 PM) Gravida  2 Maternal age  38-25 Para  2  Other Problems Illene Regulus, CMA; 05/08/2019  2:44 PM) Arthritis  Cervical Cancer  Congestive Heart Failure  Emphysema Of Lung  Hypercholesterolemia  Oophorectomy  Bilateral. Thyroid Disease  Transfusion history    Review of Systems Lars Mage Spillers CMA; 05/08/2019 2:44 PM) General Not Present- Appetite Loss, Chills, Fatigue, Fever, Night Sweats, Weight Gain and Weight Loss. Skin Not Present- Change in Wart/Mole, Dryness, Hives, Jaundice, New Lesions, Non-Healing Wounds, Rash and Ulcer. HEENT Present- Wears glasses/contact lenses. Not Present- Earache, Hearing Loss, Hoarseness, Nose Bleed, Oral Ulcers, Ringing in the Ears, Seasonal Allergies, Sinus Pain, Sore Throat, Visual Disturbances and Yellow Eyes. Respiratory Not Present- Bloody sputum, Chronic Cough, Difficulty Breathing, Snoring and Wheezing. Breast Not Present- Breast Mass, Breast Pain, Nipple Discharge and Skin Changes. Cardiovascular Not Present- Chest Pain, Difficulty Breathing Lying Down, Leg Cramps, Palpitations, Rapid Heart Rate, Shortness of Breath and Swelling of Extremities. Gastrointestinal Present- Gets full quickly at meals. Not Present- Abdominal Pain, Bloating, Bloody Stool, Change in Bowel Habits, Chronic diarrhea, Constipation, Difficulty Swallowing, Excessive gas, Hemorrhoids, Indigestion, Nausea, Rectal Pain and Vomiting. Musculoskeletal Present- Muscle Pain. Not Present- Back Pain, Joint Pain, Joint Stiffness, Muscle Weakness and Swelling of Extremities. Neurological Present- Trouble walking and Weakness. Not Present- Decreased Memory, Fainting, Headaches, Numbness, Seizures, Tingling and Tremor. Psychiatric Not Present- Anxiety, Bipolar, Change in Sleep Pattern, Depression, Fearful and Frequent crying. Endocrine Present- Cold Intolerance. Not Present- Excessive Hunger, Hair Changes, Heat Intolerance, Hot flashes and New Diabetes. Hematology Present- Blood Thinners. Not Present- Easy Bruising, Excessive bleeding, Gland problems, HIV and Persistent  Infections.  Vitals (Alisha Spillers CMA; 05/08/2019 2:45 PM) 05/08/2019 2:44 PM Weight: 116 lb Height: 65in Body Surface Area: 1.57 m Body Mass Index: 19.3 kg/m  Temp.: 55F(Oral)  Pulse: 86 (Regular)  BP: 88/58 (Sitting, Left Arm, Standard)  Physical Exam Rolm Bookbinder MD; 05/08/2019 3:09 PM) Abdomen Note: soft nt/nd well healed low midline incision cv rrr Lungs clear   Assessment & Plan Rolm Bookbinder MD; 05/08/2019 3:12 PM) GALLSTONES (K80.20) Story: Laparoscopic cholecystectomy I discussed the procedure in detail. The patient was given Neurosurgeon. We discussed the risks and benefits of a laparoscopic cholecystectomy and possible cholangiogram including, but not limited to bleeding, infection, injury to surrounding structures such as the intestine or liver, bile leak, retained gallstones, need to convert to an open procedure, prolonged diarrhea, blood clots such as DVT, common bile duct injury, anesthesia risks, and possible need for additional procedures. The likelihood of improvement in symptoms and return to the patient's normal status is good. We discussed the typical post-operative recovery course. will discuss with cardiology and gi prior to surgery as well

## 2019-05-21 NOTE — Transfer of Care (Signed)
Immediate Anesthesia Transfer of Care Note  Patient: Angelica Hernandez  Procedure(s) Performed: LAPAROSCOPIC CHOLECYSTECTOMY (N/A Abdomen)  Patient Location: PACU  Anesthesia Type:General  Level of Consciousness: drowsy and patient cooperative  Airway & Oxygen Therapy: Patient Spontanous Breathing and Patient connected to face mask oxygen  Post-op Assessment: Report given to RN and Post -op Vital signs reviewed and stable  Post vital signs: Reviewed and stable  Last Vitals:  Vitals Value Taken Time  BP 151/58 05/21/19 1131  Temp    Pulse 61 05/21/19 1132  Resp 22 05/21/19 1133  SpO2 99 % 05/21/19 1132  Vitals shown include unvalidated device data.  Last Pain: There were no vitals filed for this visit.       Complications: No apparent anesthesia complications

## 2019-05-21 NOTE — Interval H&P Note (Signed)
History and Physical Interval Note:  05/21/2019 9:00 AM  Angelica Hernandez  has presented today for surgery, with the diagnosis of GALLSTONES.  The various methods of treatment have been discussed with the patient and family. After consideration of risks, benefits and other options for treatment, the patient has consented to  Procedure(s): LAPAROSCOPIC CHOLECYSTECTOMY WITH INTRAOPERATIVE CHOLANGIOGRAM (N/A) as a surgical intervention.  The patient's history has been reviewed, patient examined, no change in status, stable for surgery.  I have reviewed the patient's chart and labs.  Questions were answered to the patient's satisfaction.     Rolm Bookbinder

## 2019-05-21 NOTE — Consult Note (Signed)
Cardiology Consultation:   Patient ID: Angelica Hernandez; [id number]; March 27, 1934   Admit date: 05/21/2019 Date of Consult: 05/21/2019  Primary Care Provider: Caryl Bis, MD Primary Cardiologist: Dr. Bronson Ing, MD  Primary Electrophysiologist: Dr. Rayann Heman, MD   Patient Profile:   Angelica Hernandez is a 85 y.o. female with a hx of CAD s/p CABG in 1980s, sick sinus syndrome s/p PPM followed by Dr. Rayann Heman, permanent atrial fibrillation on Eliquis (curently on hold secondary to laparoscopic cholecystectomy 12/7) who is being seen today for the evaluation of atrial fibrillation at the request of Dr. Donne Hazel.  History of Present Illness:   Angelica Hernandez is an 85yo F with a hx as stated above who presented to [facility] 05/21/2019 for elective laparoscopic cholecystectomy per Dr. Donne Hazel. Surgery went well per chart review with no complications. Pt seen in PACU today at which time she was resting with no complaints of pain, SOB or palpitations. On telemetry, HR's are well controlled in the 70-80's in atrial fibrillation, no evidence of RVR.   More recently, she had a prior hospitalization for cholelithiasis treated with ERCP and stent placement thought to have perforated bile duct however, this was found to be negative on both exam and imaging per chart review. Unfortunately, during that time she had an episode of atrial fibrillation with RVR in the postprocedural setting. Due to low BP's, medication titration was prohibited. Her home diltiazem was held and metoprolol was consolidated to succinate to help with rate control. At that time, her Coumadin was transitioned to Eliquis (04/17/2019) and ASA was stopped given the above. No cholecystectomy was pursued at that time. She was then seen by GI and general surgery in the outpatient setting with improved LFTs.  Plan at that time was for elective laparoscopic cholecystectomy.   She was last seen in hospital follow up by Estella Husk, PA-C on 04/24/2019. Her  HR was stable on Eliquis and metoprolol. Diltiazem was not resumed secondary to low BP at 88/58. Plan was to contact Dr. Bronson Ing for holding recommendations for Eliquis therapy prior to surgery.   Past Medical History:  Diagnosis Date  . Anxiety   . CAD (coronary artery disease)   . CKD (chronic kidney disease)   . Dysrhythmia   . Gastro-esophageal reflux disease without esophagitis   . Hypothyroidism   . Ischemic cardiomyopathy   . Jaundice 03/2019  . Mild dilation of ascending aorta (HCC)   . Moderate protein-calorie malnutrition ([place])   . Permanent atrial fibrillation ([place])   . Presence of permanent cardiac pacemaker   . Severe episode of recurrent major depressive disorder, without psychotic features ([place])   . Symptomatic bradycardia     Past Surgical History:  Procedure Laterality Date  . BILIARY DILATION  04/14/2019   Procedure: BILIARY DILATION;  Surgeon: Rush Landmark Telford Nab., MD;  Location: [place];  Service: Gastroenterology;;  . BILIARY STENT PLACEMENT  04/14/2019   Procedure: BILIARY STENT PLACEMENT;  Surgeon: Irving Copas., MD;  Location: [place];  Service: Gastroenterology;;  . BIOPSY  04/14/2019   Procedure: BIOPSY;  Surgeon: Irving Copas., MD;  Location: [place];  Service: Gastroenterology;;  . CESAREAN SECTION    . COLONOSCOPY N/A 03/26/2019   Procedure: COLONOSCOPY;  Surgeon: Daneil Dolin, MD;  [facility];  Service: Endoscopy;  Laterality: N/A;  . CORONARY ARTERY BYPASS GRAFT  1970   1970s when living in [place]  . ERCP N/A 04/14/2019   Procedure: ENDOSCOPIC RETROGRADE CHOLANGIOPANCREATOGRAPHY (ERCP);  Surgeon: Rush Landmark,  Telford Nab., MD;  Location: [facility];  Service: Gastroenterology;  Laterality: N/A;  . ESOPHAGOGASTRODUODENOSCOPY (EGD) WITH PROPOFOL N/A 03/26/2019   Procedure: ESOPHAGOGASTRODUODENOSCOPY (EGD) WITH PROPOFOL;  Surgeon: Daneil Dolin, MD;  [facility];  Service:  Endoscopy;  Laterality: N/A;  . ESOPHAGOGASTRODUODENOSCOPY (EGD) WITH PROPOFOL N/A 04/14/2019   Procedure: ESOPHAGOGASTRODUODENOSCOPY (EGD) WITH PROPOFOL;  Surgeon: Rush Landmark Telford Nab., MD;  Location: [place];  Service: Gastroenterology;  Laterality: N/A;  . EUS N/A 04/14/2019   Procedure: UPPER ENDOSCOPIC ULTRASOUND (EUS) LINEAR;  Surgeon: Irving Copas., MD;  Location: [place];  Service: Gastroenterology;  Laterality: N/A;  . HEMOSTASIS CLIP PLACEMENT  04/14/2019   Procedure: HEMOSTASIS CLIP PLACEMENT;  Surgeon: Irving Copas., MD;  Location: [place];  Service: Gastroenterology;;  . OTHER SURGICAL HISTORY     cesearan section   . PACEMAKER GENERATOR CHANGE  06/15/2017   [employer] Accolade SR L [id number] ([place] [id number]) implanted by Dr Ernestina Patches at [facility] in [place] for symptomatic bradycardia  . PACEMAKER IMPLANT  11/16/2006   PPM implanted for bradycardia  . PANCREATIC STENT PLACEMENT  04/14/2019   Procedure: PANCREATIC STENT PLACEMENT;  Surgeon: Rush Landmark Telford Nab., MD;  Location: [place];  Service: Gastroenterology;;  . REMOVAL OF STONES  04/14/2019   Procedure: REMOVAL OF STONES;  Surgeon: Irving Copas., MD;  Location: [place];  Service: Gastroenterology;;  . Joan Mayans  04/14/2019   Procedure: Joan Mayans;  Surgeon: Irving Copas., MD;  Location: [place];  Service: Gastroenterology;;     Prior to Admission medications   Medication Sig Start Date End Date Taking? Authorizing Provider  amoxicillin (AMOXIL) 500 MG capsule Take 500 mg by mouth 2 (two) times daily.   Yes [provider]  apixaban (ELIQUIS) 2.5 MG TABS tablet Take 1 tablet (2.5 mg total) by mouth 2 (two) times daily. 05/02/19 10/29/19 Yes Herminio Commons, MD  atorvastatin (LIPITOR) 10 MG tablet Take 10 mg by mouth daily.   Yes [provider]  DULoxetine (CYMBALTA) 30 MG capsule Take 30 mg  by mouth daily.  03/01/19  Yes [provider]  FERREX 150 150 MG capsule Take 150 mg by mouth daily.  02/07/18  Yes [provider]  lansoprazole (PREVACID) 30 MG capsule Take 30 mg by mouth 2 (two) times daily before a meal.   Yes [provider]  levothyroxine (SYNTHROID, LEVOTHROID) 50 MCG tablet Take 50 mcg by mouth daily before breakfast.   Yes [provider]  metoprolol succinate (TOPROL-XL) 100 MG 24 hr tablet Take 100 mg by mouth 2 (two) times daily. 04/18/19  Yes [provider]  nitroGLYCERIN (NITROSTAT) 0.4 MG SL tablet Place 1 tablet (0.4 mg total) under the tongue every 5 (five) minutes as needed for chest pain. 09/18/18  Yes Herminio Commons, MD  pantoprazole (PROTONIX) 40 MG tablet Take 1 tablet (40 mg total) by mouth 2 (two) times daily. Patient taking differently: Take 40 mg by mouth daily.  05/01/19 05/31/19 Yes Mansouraty, Telford Nab., MD  Polyethyl Glycol-Propyl Glycol (SYSTANE OP) Apply 1 drop to eye 3 (three) times daily as needed (dry eyes).   Yes [provider]  metoprolol succinate (TOPROL-XL) 200 MG 24 hr tablet Take 1 tablet (200 mg total) by mouth daily. Take with or immediately following a meal. Patient not taking: Reported on 05/14/2019 04/18/19 05/18/19  Barb Merino, MD    Inpatient Medications: Scheduled Meds:  Continuous Infusions: . ceFAZolin    . lactated  ringers 10 mL/hr at 05/21/19 0935   PRN Meds: fentaNYL (SUBLIMAZE) injection  Allergies:   No Known Allergies  Social History:   Social History   Socioeconomic History  . Marital status: Widowed    Spouse name: Not on file  . Number of children: Not on file  . Years of education: Not on file  . Highest education level: Not on file  Occupational History  . Not on file  Social Needs  . Financial resource strain: Not on file  . Food insecurity    Worry: Not on file    Inability: Not on file  . Transportation needs    Medical: Not on  file    Non-medical: Not on file  Tobacco Use  . Smoking status: Current Every Day Smoker    Packs/day: 0.50    Years: 72.00    Pack years: 36.00    Types: Cigarettes  . Smokeless tobacco: Never Used  Substance and Sexual Activity  . Alcohol use: Never    Frequency: Never  . Drug use: Never  . Sexual activity: Not on file  Lifestyle  . Physical activity    Days per week: Not on file    Minutes per session: Not on file  . Stress: Not on file  Relationships  . Social Herbalist on phone: Not on file    Gets together: Not on file    Attends religious service: Not on file    Active member of club or organization: Not on file    Attends meetings of clubs or organizations: Not on file    Relationship status: Not on file  . Intimate partner violence    Fear of current or ex partner: Not on file    Emotionally abused: Not on file    Physically abused: Not on file    Forced sexual activity: Not on file  Other Topics Concern  . Not on file  Social History Narrative   Lives with daughter in [place]    Family History:   Family History  Problem Relation Age of Onset  . Heart disease Mother   . Hypertension Mother   . Diabetes Mother   . Hypertension Father   . Heart disease Father   . Hypertension Brother   . Colon cancer Neg Hx   . Esophageal cancer Neg Hx   . Inflammatory bowel disease Neg Hx   . Pancreatic cancer Neg Hx   . Stomach cancer Neg Hx   . Rectal cancer Neg Hx    Family Status:  Family Status  Relation Name Status  . Mother  Deceased  . Father  Deceased  . Brother  Alive  . Neg Hx  (Not Specified)    ROS:  Please see the history of present illness.  All other ROS reviewed and negative.     Physical Exam/Data:   Vitals:   05/21/19 0844 05/21/19 0855 05/21/19 1130  BP: 119/88  (!) 151/58  Pulse: 75  87  Resp: 16  15  Temp: 97.9 F (36.6 C)  (!) 97.4 F (36.3 C)  SpO2: 100%  93%  Weight:  52.8 kg   Height:  5' 5.98" (1.676 m)      Intake/Output Summary (Last 24 hours) at 05/21/2019 1155 Last data filed at 05/21/2019 1100 Gross per 24 hour  Intake 700 ml  Output 50 ml  Net 650 ml   Filed Weights   05/21/19 0855  Weight: 52.8 kg   Body  mass index is 18.79 kg/m.   General: Elderly, NAD Neck: Negative for carotid bruits. No JVD Lungs:Clear to ausculation bilaterally. No wheezes.Breathing is unlabored. Cardiovascular: Irregularly irregular with S1 S2. No murmur Abdomen: Soft, non-tender, non-distended. No obvious abdominal masses. Extremities: No edema. DP pulses 1+ bilaterally Neuro: Alert and oriented. No focal deficits. No facial asymmetry. MAE spontaneously. Psych: Responds to questions appropriately with normal affect.    EKG:  The EKG was personally reviewed and demonstrates: 04/13/2019 atrial fibrillation with RVR, HR 118 Telemetry:  Telemetry was personally reviewed and demonstrates: 05/21/2019 atrial fibrillation with HR's in the 80's   Relevant CV Studies:  ECHO: 02/22/2018:  Study Conclusions  - Left ventricle: The cavity size was normal. Wall thickness was   normal. Systolic function was mildly reduced. The estimated   ejection fraction was in the range of 45% to 50%. Diffuse   hypokinesis. Abnormal global longitudinal strain of -12.2%. The   study was not technically sufficient to allow evaluation of LV   diastolic dysfunction due to atrial fibrillation. - Aortic valve: Mildly calcified annulus. Trileaflet. - Mitral valve: Mildly calcified annulus. There was mild   regurgitation. - Right ventricle: Pacer wire or catheter noted in right ventricle. - Right atrium: Pacer wire or catheter noted in right atrium. - Atrial septum: No defect or patent foramen ovale was identified. - Tricuspid valve: There was moderate regurgitation. - Pulmonary arteries: PA peak pressure: 35 mm Hg (S). - Pericardium, extracardiac: There was no pericardial effusion.  Laboratory Data:  Chemistry Recent Labs   Lab 05/17/19 1000  NA 139  K 3.6  CL 106  CO2 25  GLUCOSE 86  BUN 9  CREATININE 0.58  CALCIUM 8.8*  GFRNONAA >60  GFRAA >60  ANIONGAP 8    Total Protein  Date Value Ref Range Status  04/18/2019 5.3 (L) 6.5 - 8.1 g/dL Final   Albumin  Date Value Ref Range Status  04/18/2019 2.4 (L) 3.5 - 5.0 g/dL Final   AST  Date Value Ref Range Status  04/18/2019 133 (H) 15 - 41 U/L Final   ALT  Date Value Ref Range Status  04/18/2019 93 (H) 0 - 44 U/L Final   Alkaline Phosphatase  Date Value Ref Range Status  04/18/2019 287 (H) 38 - 126 U/L Final   Total Bilirubin  Date Value Ref Range Status  04/18/2019 3.4 (H) 0.3 - 1.2 mg/dL Final   Hematology Recent Labs  Lab 05/17/19 1000  WBC 9.8  RBC 4.13  HGB 11.6*  HCT 37.6  MCV 91.0  MCH 28.1  MCHC 30.9  RDW 14.5  PLT 324   Cardiac EnzymesNo results for input(s): TROPONINI in the last 168 hours. No results for input(s): TROPIPOC in the last 168 hours.  BNPNo results for input(s): BNP, PROBNP in the last 168 hours.  DDimer No results for input(s): DDIMER in the last 168 hours. TSH:  Lab Results  Component Value Date   TSH 1.464 03/22/2019   Lipids:No results found for: CHOL, HDL, LDLCALC, LDLDIRECT, TRIG, CHOLHDL HgbA1c:No results found for: HGBA1C  Radiology/Studies:  No results found.  Assessment and Plan:   1. Permanent atrial fibrillation: -Pt has a known hx of permanent atrial fibrillation followed by Dr. Bronson Ing and Dr. Rayann Heman. As above, was recently hospitalized for abdominal pain and underwent ERCP with stent placement. Unfortunately in the postoperative setting she was found to be in AF with RVR in which medication titration was limited secondary to hypotension. At that time her diltiazem  was held and metoprolol was consolidated to succinate. She was then seen in follow up at which time her diltiazem was not restarted however rates were noted to be stable. SHe was then seen by general surgery with c/o of  continued abdominal pain at which time plans were made to pursue laparoscopic cholecystomy, performed 05/21/2019 -Rates are controlled today in the 80's -Would resume metoprolol at half dose of 50mg  for now and monitor BP closely -Plan to restart reduced dose Eliquis 2.5 mg twice daily when safe from a surgical standpoint -CHA2DS2VASc= 5   2.  CAD s/p remoted CABG: -Denies anginal symptoms -No ASA secondary to [facility] with ELiquis  -Statin currently on hold secondary to biliary disease and elevated LFTs on last hospital admission>>remian elevated at 133/93 (AST/ALT)   3. History of hypotension: -Stable today, 133/74, 140/93, 150/98 -Unable to resume diltiazem at OP visit secondary to  asymptomatic hypotension -Noted to be on metoprolol succinate 100 mg twice daily for rate control? Resume at lower dose of 50mg  QD and monitor BP closely  4. Hx of SSS s/p PPM: -Followed by Dr. Rayann Heman, last seen 02/16/2019 with normal PPM function and no changes made at that time    For questions or updates, please contact [facility] Please consult [email or web address] for contact info under Cardiology/STEMI.   SignedKathyrn Drown NP-C [facility] Pager: [phone] 05/21/2019 11:55 AM

## 2019-05-21 NOTE — Anesthesia Procedure Notes (Signed)
Procedure Name: Intubation Date/Time: 05/21/2019 10:22 AM Performed by: Kathryne Hitch, CRNA Pre-anesthesia Checklist: Patient identified, Emergency Drugs available, Suction available and Patient being monitored Patient Re-evaluated:Patient Re-evaluated prior to induction Oxygen Delivery Method: Circle system utilized Preoxygenation: Pre-oxygenation with 100% oxygen Induction Type: IV induction Ventilation: Mask ventilation without difficulty Laryngoscope Size: Miller and 2 Grade View: Grade I Tube type: Oral Tube size: 7.0 mm Number of attempts: 1 Airway Equipment and Method: Stylet and Oral airway Placement Confirmation: ETT inserted through vocal cords under direct vision,  positive ETCO2 and breath sounds checked- equal and bilateral Secured at: 22 cm Tube secured with: Tape Dental Injury: Teeth and Oropharynx as per pre-operative assessment

## 2019-05-21 NOTE — Op Note (Signed)
Preoperative diagnosis: History of choledocholithiasis, symptomatic cholelithiasis Postoperative diagnosis: Same as above Procedure: Laparoscopic cholecystectomy Surgeon: Dr. Serita Grammes Anesthesia: General Specimens: Gallbladder and contents to pathology Estimated blood loss: 75 cc Complications: None Drains: None Sponge and count was correct at completion Disposition to recovery stable condition  Indications: This is an 85 year old female who was admitted to the hospital couple of months ago with multiple medical problems on Eliquis.  She had choledocholithiasis at that time.  She was treated with ERCP and a stent and was thought to have a perforated bile duct and duodenum.  They said that been negative.  GI wanted [place] to hold on a cholecystectomy at that time.  I saw her back in the office with improving LFTs and some occasional upper abdominal pain.  She did have a right upper quadrant ultrasound previously that showed a distended gallbladder with sludge.  I discussed with she and her daughter proceeding with a cholecystectomy.  Procedure: After informed consent was obtained the patient was taken the operating.  She was given antibiotics.  SCDs were placed.  She had held her Eliquis.  She was then placed under general anesthesia without complication.  She was prepped and draped in the standard sterile surgical fashion.  Surgical timeout was then performed.  I filtrated Marcaine below umbilicus.  She had a prior low midline from hysterectomy.  I made incision and identified the fascia.  I incised this sharply and entered the peritoneum bluntly.  There was no evidence of an entry injury.  I then placed a 0 Vicryl pursestring suture through the fascia.  I then inserted a Hassan trocar and insufflated the abdomen to 15 mmHg pressure.  Her liver looks fairly cirrhotic.  The gallbladder showed chronic cholecystitis.  I was able to retract the gallbladder cephalad after inserting 3 additional 5 mm  trocars in the epigastric right upper quadrant.  The gallbladder was severely inflamed.  There was omentum adherent to it that I took down with a combination of blunt dissection as well as cautery.  I was eventually able to retract the gallbladder cephalad and lateral.  I was then able to identify the structures in the triangle and obtain the critical view of safety.  I then clipped the cystic duct 3 times.  I divided it leaving 2 in place.  The clips traversed the duct and the duct was viable.  I then treated the artery in a similar fashion and divided it.  There was a posterior branch of the artery that was bleeding.  I was able to grasp this with a [place] grasper and apply an additional clip.  This accounted for the blood loss during the operation.  Her lymph node was also very friable and every time that I had to grasp her lymph node or cauterize near it it was bleeding as well.  I was eventually able to remove the gallbladder from the liver bed and placed in a retrieval bag and removed it.  I cauterized the liver bed.  I then did place 2 pieces of Surgicel snow.  I evacuated all the fluid.  I then remove the [facility] trocar and tied my pursestring down.  I placed an additional 0 Vicryl using a suture passer.  I then removed the trocars and desufflated the abdomen.  These were closed with 4-0 Monocryl and glue.  She tolerated this will be transferred to telemetry overnight for observation.

## 2019-05-22 ENCOUNTER — Encounter (HOSPITAL_COMMUNITY): Payer: Self-pay | Admitting: General Surgery

## 2019-05-22 DIAGNOSIS — K811 Chronic cholecystitis: Secondary | ICD-10-CM | POA: Diagnosis not present

## 2019-05-22 DIAGNOSIS — I5042 Chronic combined systolic (congestive) and diastolic (congestive) heart failure: Secondary | ICD-10-CM | POA: Diagnosis not present

## 2019-05-22 DIAGNOSIS — I4821 Permanent atrial fibrillation: Secondary | ICD-10-CM | POA: Diagnosis not present

## 2019-05-22 LAB — COMPREHENSIVE METABOLIC PANEL
ALT: 27 U/L (ref 0–44)
AST: 57 U/L — ABNORMAL HIGH (ref 15–41)
Albumin: 3.1 g/dL — ABNORMAL LOW (ref 3.5–5.0)
Alkaline Phosphatase: 112 U/L (ref 38–126)
Anion gap: 12 (ref 5–15)
BUN: 8 mg/dL (ref 8–23)
CO2: 24 mmol/L (ref 22–32)
Calcium: 9 mg/dL (ref 8.9–10.3)
Chloride: 104 mmol/L (ref 98–111)
Creatinine, Ser: 0.67 mg/dL (ref 0.44–1.00)
GFR calc Af Amer: >60 mL/min (ref >60)
GFR calc non Af Amer: >60 mL/min (ref >60)
Glucose, Bld: 106 mg/dL — ABNORMAL HIGH (ref 70–99)
Potassium: 4.2 mmol/L (ref 3.5–5.1)
Sodium: 140 mmol/L (ref 135–145)
Total Bilirubin: 1 mg/dL (ref 0.3–1.2)
Total Protein: 5.9 g/dL — ABNORMAL LOW (ref 6.5–8.1)

## 2019-05-22 LAB — CBC
HCT: 34.9 % — ABNORMAL LOW (ref 36.0–46.0)
Hemoglobin: 11.2 g/dL — ABNORMAL LOW (ref 12.0–15.0)
MCH: 28 pg (ref 26.0–34.0)
MCHC: 32.1 g/dL (ref 30.0–36.0)
MCV: 87.3 fL (ref 80.0–100.0)
Platelets: 327 10*3/uL (ref 150–400)
RBC: 4 MIL/uL (ref 3.87–5.11)
RDW: 14.2 % (ref 11.5–15.5)
WBC: 18.3 10*3/uL — ABNORMAL HIGH (ref 4.0–10.5)
nRBC: 0 % (ref 0.0–0.2)

## 2019-05-22 LAB — SURGICAL PATHOLOGY

## 2019-05-22 MED ORDER — OXYCODONE HCL 5 MG PO TABS: 5.0000 mg | ORAL_TABLET | ORAL | 0 refills | Status: DC | PRN

## 2019-05-22 NOTE — Discharge Instructions (Signed)
[facility] LAPAROSCOPIC SURGERY: POST OP INSTRUCTIONS Restart eliquis on Friday 12/11 Always review your discharge instruction sheet given to you by the facility where your surgery was performed. IF YOU HAVE DISABILITY OR FAMILY LEAVE FORMS, YOU MUST BRING THEM TO THE OFFICE FOR PROCESSING.   DO NOT GIVE THEM TO YOUR DOCTOR.  1. A prescription for pain medication may be given to you upon discharge.  Take your pain medication as prescribed, if needed.  If narcotic pain medicine is not needed, then you may take acetaminophen (Tylenol), naprosyn (Alleve), or ibuprofen (Advil) as needed. 2. Take your usually prescribed medications unless otherwise directed. 3. If you need a refill on your pain medication, please contact your pharmacy.  They will contact our office to request authorization. Prescriptions will not be filled after 5pm or on week-ends. 4. You should follow a light diet the first few days after arrival home, such as soup and crackers, etc.  Be sure to include lots of fluids daily. 5. Most patients will experience some swelling and bruising in the area of the incisions.  Ice packs will help.  Swelling and bruising can take several days to resolve.  6. It is common to experience some constipation if taking pain medication after surgery.  Increasing fluid intake and taking a stool softener (such as Colace) will usually help or prevent this problem from occurring.  A mild laxative (Milk of Magnesia or Miralax) should be taken according to package instructions if there are no bowel movements after 48 hours. 7. Unless discharge instructions indicate otherwise, you may remove your bandages 48 hours after surgery, and you may shower at that time.  You may have steri-strips (small skin tapes) in place directly over the incision.  These strips should be left on the skin for 7-10 days.  If your surgeon used skin glue on the incision, you may shower in  24 hours.  The glue will flake off over the next 2-3 weeks.  Any sutures or staples will be removed at the office during your follow-up visit. 8. ACTIVITIES:  You may resume regular (light) daily activities beginning the next day--such as daily self-care, walking, climbing stairs--gradually increasing activities as tolerated.  You may have sexual intercourse when it is comfortable.  Refrain from any heavy lifting or straining until approved by your doctor. a. You may drive when you are no longer taking prescription pain medication, you can comfortably wear a seatbelt, and you can safely maneuver your car and apply brakes. b. RETURN TO WORK:  __________________________________________________________ 9. You should see your doctor in the office for a follow-up appointment approximately 2-3 weeks after your surgery.  Make sure that you call for this appointment within a day or two after you arrive home to insure a convenient appointment time. 10. OTHER INSTRUCTIONS: __________________________________________________________________________________________________________________________ __________________________________________________________________________________________________________________________ WHEN TO CALL YOUR DOCTOR: 1. Fever over 101.0 2. Inability to urinate 3. Continued bleeding from incision. 4. Increased pain, redness, or drainage from the incision. 5. Increasing abdominal pain  The clinic staff is available to answer your questions during regular business hours.  Please dont hesitate to call and ask to speak to one of the nurses for clinical concerns.  If you have a medical emergency, go to the nearest emergency room or call 911.  A surgeon from [facility] is always on call at the hospital. [address] [place], [place], [place]  [address] ? [address], [place], [place]   [address] [phone] ?  [phone] ? FAX [phone] Web site:  [email or web address]

## 2019-05-22 NOTE — Discharge Summary (Signed)
Physician Discharge Summary  Patient ID: Angelica Hernandez MRN: [id number] DOB/AGE: 83/05/1934 83 y.o.  Admit date: 05/21/2019 Discharge date: 05/22/2019  Admission Diagnoses: History of choledocholithiasis CAD Afib Depression  CKD  Discharge Diagnoses:  Active Problems:   Gallstones   Chronic combined systolic and diastolic heart failure [facility])  Discharged Condition: good  Hospital Course: 85 yof who had history of choledocholithiasis who now has undergone laparoscopic cholecystectomy.  She did well from this and was tolerating a regular diet.  She is having bowel function as well. Labs are normal. Will discharge home.    Consults: cardiology  Significant Diagnostic Studies: none  Treatments: laparoscopic cholecystectomy    Disposition: Discharge disposition: 01-Home or Self Care        Allergies as of 05/22/2019   No Known Allergies     Medication List    TAKE these medications   amoxicillin 500 MG capsule Commonly known as: AMOXIL Take 500 mg by mouth 2 (two) times daily.   apixaban 2.5 MG Tabs tablet Commonly known as: ELIQUIS Take 1 tablet (2.5 mg total) by mouth 2 (two) times daily.   atorvastatin 10 MG tablet Commonly known as: LIPITOR Take 10 mg by mouth daily.   DULoxetine 30 MG capsule Commonly known as: CYMBALTA Take 30 mg by mouth daily.   Ferrex 150 150 MG capsule Generic drug: iron polysaccharides Take 150 mg by mouth daily.   lansoprazole 30 MG capsule Commonly known as: PREVACID Take 30 mg by mouth 2 (two) times daily before a meal.   levothyroxine 50 MCG tablet Commonly known as: SYNTHROID Take 50 mcg by mouth daily before breakfast.   metoprolol succinate 100 MG 24 hr tablet Commonly known as: TOPROL-XL Take 100 mg by mouth 2 (two) times daily. What changed: Another medication with the same name was removed. Continue taking this medication, and follow the directions you see here.   nitroGLYCERIN 0.4 MG SL tablet Commonly  known as: NITROSTAT Place 1 tablet (0.4 mg total) under the tongue every 5 (five) minutes as needed for chest pain.   oxyCODONE 5 MG immediate release tablet Commonly known as: Oxy IR/ROXICODONE Take 1 tablet (5 mg total) by mouth every 4 (four) hours as needed for moderate pain.   pantoprazole 40 MG tablet Commonly known as: Protonix Take 1 tablet (40 mg total) by mouth 2 (two) times daily. What changed: when to take this   SYSTANE OP Apply 1 drop to eye 3 (three) times daily as needed (dry eyes).      Follow-up Information    Rolm Bookbinder, MD Follow up in 3 week(s).   Specialty: General Surgery Contact information: [address] [place] [address] [phone]           Signed: Rolm Bookbinder 05/22/2019, 2:07 PM

## 2019-05-22 NOTE — Progress Notes (Signed)
1 Day Post-Op   Subjective/Chief Complaint: No real complaints, wants to go home, refusing meds and labs, tol diet, pain fine, oob  Objective: Vital signs in last 24 hours: Temp:  [97.4 F (36.3 C)-98.3 F (36.8 C)] 98 F (36.7 C) (12/08 0521) Pulse Rate:  [64-123] 80 (12/08 0521) Resp:  [12-17] 16 (12/08 0521) BP: (107-151)/(57-98) 115/57 (12/08 0521) SpO2:  [93 %-100 %] 96 % (12/08 0521) Weight:  [52.8 kg] 52.8 kg (12/07 0855)    Intake/Output from previous day: 12/07 0701 - 12/08 0700 In: 727 [I.V.:727] Out: 50 [Blood:50] Intake/Output this shift: No intake/output data recorded.  Well appearing in nad Lungs clear cv irreg irreg abd soft incisions clean approp tender   Assessment/Plan: POD 1 lap chole- chronic cholecystitis and prior choledocholithiasis -continue home meds -check labs and if fine will dc home today -restart eliquis on Friday at home  Rolm Bookbinder 05/22/2019

## 2019-05-22 NOTE — Anesthesia Postprocedure Evaluation (Signed)
Anesthesia Post Note  Patient: Angelica Hernandez  Procedure(s) Performed: LAPAROSCOPIC CHOLECYSTECTOMY (N/A Abdomen)     Patient location during evaluation: PACU Anesthesia Type: General Level of consciousness: awake and alert Pain management: pain level controlled Vital Signs Assessment: post-procedure vital signs reviewed and stable Respiratory status: spontaneous breathing, nonlabored ventilation, respiratory function stable and patient connected to nasal cannula oxygen Cardiovascular status: blood pressure returned to baseline and stable Postop Assessment: no apparent nausea or vomiting Anesthetic complications: no    Last Vitals:  Vitals:   05/21/19 2246 05/22/19 0521  BP: 114/61 (!) 115/57  Pulse: (!) 123 80  Resp: 16 16  Temp: 36.6 C 36.7 C  SpO2: 97% 96%    Last Pain:  Vitals:   05/22/19 0932  TempSrc:   PainSc: 0-No pain                 Jeziah Kretschmer L Rozlyn Yerby

## 2019-05-22 NOTE — Progress Notes (Signed)
Progress Note  Patient Name: Angelica Hernandez Date of Encounter: 05/22/2019  Primary Cardiologist: Kate Sable, MD   Subjective   Feeling well.  Eager to go home.  No chest pain, shortness of breath or palpitations  Inpatient Medications    Scheduled Meds: . acetaminophen  1,000 mg Oral Q6H  . atorvastatin  10 mg Oral Daily  . DULoxetine  30 mg Oral Daily  . iron polysaccharides  150 mg Oral Daily  . levothyroxine  50 mcg Oral Q0600  . metoprolol succinate  100 mg Oral BID  . pantoprazole  40 mg Oral Daily   Continuous Infusions:  PRN Meds: methocarbamol, morphine injection, nitroGLYCERIN, ondansetron **OR** ondansetron (ZOFRAN) IV, oxyCODONE, polyvinyl alcohol, simethicone   Vital Signs    Vitals:   05/21/19 1755 05/21/19 2154 05/21/19 2246 05/22/19 0521  BP: 130/76 130/85 114/61 (!) 115/57  Pulse: 78 90 (!) 123 80  Resp: 16 17 16 16   Temp: 98.2 F (36.8 C) 98.3 F (36.8 C) 97.8 F (36.6 C) 98 F (36.7 C)  TempSrc: Oral Oral Oral Oral  SpO2: 98% 98% 97% 96%  Weight:      Height:        Intake/Output Summary (Last 24 hours) at 05/22/2019 1002 Last data filed at 05/21/2019 1700 Gross per 24 hour  Intake 727 ml  Output 50 ml  Net 677 ml   Last 3 Weights 05/21/2019 05/17/2019 05/01/2019  Weight (lbs) 116 lb 6 oz 116 lb 6 oz 114 lb  Weight (kg) 52.787 kg 52.787 kg 51.71 kg      Telemetry    n/a - Personally Reviewed  ECG     n/a- Personally Reviewed  Physical Exam   VS:  BP (!) 115/57 (BP Location: Right Arm)   Pulse 80   Temp 98 F (36.7 C) (Oral)   Resp 16   Ht 5' 5.98" (1.676 m)   Wt 52.8 kg   SpO2 96%   BMI 18.79 kg/m  , BMI Body mass index is 18.79 kg/m. GENERAL:  Well appearing HEENT: Pupils equal round and reactive, fundi not visualized, oral mucosa unremarkable NECK:  No jugular venous distention, waveform within normal limits, carotid upstroke brisk and symmetric, no bruits LUNGS:  Clear to auscultation bilaterally HEART:   Irregularly irregular. PMI not displaced or sustained,S1 and S2 within normal limits, no S3, no S4, no clicks, no rubs, no murmurs ABD:  Flat, positive bowel sounds normal in frequency in pitch, no bruits, no rebound, no guarding, no midline pulsatile mass, no hepatomegaly, no splenomegaly EXT:  2 plus pulses throughout, no edema, no cyanosis no clubbing SKIN:  No rashes no nodules NEURO:  Cranial nerves II through XII grossly intact, motor grossly intact throughout PSYCH:  Cognitively intact, oriented to person place and time   Labs    High Sensitivity Troponin:  No results for input(s): TROPONINIHS in the last 720 hours.    Chemistry Recent Labs  Lab 05/17/19 1000 05/22/19 0750  NA 139 140  K 3.6 4.2  CL 106 104  CO2 25 24  GLUCOSE 86 106*  BUN 9 8  CREATININE 0.58 0.67  CALCIUM 8.8* 9.0  PROT  --  5.9*  ALBUMIN  --  3.1*  AST  --  57*  ALT  --  27  ALKPHOS  --  112  BILITOT  --  1.0  GFRNONAA >60 >60  GFRAA >60 >60  ANIONGAP 8 12     Hematology Recent Labs  Lab  05/17/19 1000 05/22/19 0750  WBC 9.8 18.3*  RBC 4.13 4.00  HGB 11.6* 11.2*  HCT 37.6 34.9*  MCV 91.0 87.3  MCH 28.1 28.0  MCHC 30.9 32.1  RDW 14.5 14.2  PLT 324 327    BNPNo results for input(s): BNP, PROBNP in the last 168 hours.   DDimer No results for input(s): DDIMER in the last 168 hours.   Radiology    No results found.  Cardiac Studies   Echo 02/22/18: Study Conclusions  - Left ventricle: The cavity size was normal. Wall thickness was   normal. Systolic function was mildly reduced. The estimated   ejection fraction was in the range of 45% to 50%. Diffuse   hypokinesis. Abnormal global longitudinal strain of -12.2%. The   study was not technically sufficient to allow evaluation of LV   diastolic dysfunction due to atrial fibrillation. - Aortic valve: Mildly calcified annulus. Trileaflet. - Mitral valve: Mildly calcified annulus. There was mild   regurgitation. - Right  ventricle: Pacer wire or catheter noted in right ventricle. - Right atrium: Pacer wire or catheter noted in right atrium. - Atrial septum: No defect or patent foramen ovale was identified. - Tricuspid valve: There was moderate regurgitation. - Pulmonary arteries: PA peak pressure: 35 mm Hg (S). - Pericardium, extracardiac: There was no pericardial effusion.  Patient Profile     85 y.o. female with CAD s/p CABG, permanent atrial fibrillation, chronic systolic and diastolic heart failure, and SSS s/p PPM admitted for elective cholecystectomy.    Assessment & Plan    # Permanent atrial fibrillation:   Rates remain well-controlled.  She is asymptomatic.  Continue her home metoprolol.  Per Dr. Donne Hazel okay to resume Eliquis 2.5 mg twice daily on Friday.  # Chronic systolic and diastolic heart failure:  She is euvolemic.  Renal function stable.  Continue Toprol succinate.  # Hyperlipidemia:  Continue atorvastatin.  OK for discharge from cardiac standpoint.   For questions or updates, please contact [facility] Please consult [email or web address] for contact info under        Signed, Skeet Latch, MD  05/22/2019, 10:02 AM

## 2019-05-22 NOTE — Progress Notes (Signed)
Attempted to give morning meds to patient. Pt refused med and threw meds on floor when nurse handed it to her. Pt stated she is irritated. Attempted to call daughter, received no answer. Pt also refused morning labs.

## 2019-05-22 NOTE — Progress Notes (Signed)
Rn gave pt discharge instructions and pt stated understanding. Pt pulled out IV earlier, pt clothed and daughter has been called. 1 prescription escribed to home pharmacy.

## 2019-05-24 ENCOUNTER — Ambulatory Visit: Payer: Medicare Other | Admitting: Cardiovascular Disease

## 2019-06-27 ENCOUNTER — Telehealth: Payer: Self-pay | Admitting: Gastroenterology

## 2019-06-27 NOTE — Telephone Encounter (Signed)
Dr Rush Landmark please advise the timing of the ERCP

## 2019-06-27 NOTE — Telephone Encounter (Signed)
The pt has been advised I will call her to set up ERCP in a few days.

## 2019-06-27 NOTE — Telephone Encounter (Signed)
ERCP in February is reasonable we need to get rid of the stents sooner rather than later. Thank you. Category 2. GM

## 2019-06-28 NOTE — Telephone Encounter (Signed)
Left message on machine to call back  

## 2019-06-29 ENCOUNTER — Telehealth: Payer: Self-pay

## 2019-06-29 ENCOUNTER — Other Ambulatory Visit: Payer: Self-pay

## 2019-06-29 DIAGNOSIS — Z9889 Other specified postprocedural states: Secondary | ICD-10-CM

## 2019-06-29 DIAGNOSIS — K8689 Other specified diseases of pancreas: Secondary | ICD-10-CM

## 2019-06-29 NOTE — Telephone Encounter (Signed)
-----   Message from Herminio Commons, MD sent at 06/29/2019 10:04 AM EST ----- Eliquis can be held the morning of procedure and the day before. ----- Message ----- From: Timothy Lasso, RN Sent: 06/29/2019  10:02 AM EST To: Herminio Commons, MD

## 2019-06-29 NOTE — Telephone Encounter (Signed)
ERCP scheduled with Dr Rush Landmark on 08/05/19 at  [place] testing on 07/30/19.  The pt daughter has been advised and instructed.  Eliquis hold letter has been sent to Dr Ivonne Andrew.  Instructions mailed to the pt home and available in My Chart.

## 2019-06-29 NOTE — Telephone Encounter (Signed)
Pt aware and noted on Instructions

## 2019-07-04 ENCOUNTER — Encounter (HOSPITAL_COMMUNITY): Payer: Self-pay

## 2019-07-04 ENCOUNTER — Inpatient Hospital Stay (HOSPITAL_COMMUNITY)
Admission: EM | Admit: 2019-07-04 | Discharge: 2019-07-16 | DRG: 872 | Disposition: E | Payer: Medicare Other | Attending: Internal Medicine | Admitting: Internal Medicine

## 2019-07-04 ENCOUNTER — Other Ambulatory Visit: Payer: Self-pay

## 2019-07-04 ENCOUNTER — Emergency Department (HOSPITAL_COMMUNITY): Payer: Medicare Other

## 2019-07-04 ENCOUNTER — Inpatient Hospital Stay (HOSPITAL_COMMUNITY): Payer: Medicare Other

## 2019-07-04 DIAGNOSIS — N189 Chronic kidney disease, unspecified: Secondary | ICD-10-CM | POA: Diagnosis present

## 2019-07-04 DIAGNOSIS — I4891 Unspecified atrial fibrillation: Secondary | ICD-10-CM | POA: Diagnosis not present

## 2019-07-04 DIAGNOSIS — E039 Hypothyroidism, unspecified: Secondary | ICD-10-CM | POA: Diagnosis present

## 2019-07-04 DIAGNOSIS — F332 Major depressive disorder, recurrent severe without psychotic features: Secondary | ICD-10-CM | POA: Diagnosis present

## 2019-07-04 DIAGNOSIS — W19XXXA Unspecified fall, initial encounter: Secondary | ICD-10-CM

## 2019-07-04 DIAGNOSIS — K5289 Other specified noninfective gastroenteritis and colitis: Secondary | ICD-10-CM | POA: Diagnosis present

## 2019-07-04 DIAGNOSIS — Z79899 Other long term (current) drug therapy: Secondary | ICD-10-CM

## 2019-07-04 DIAGNOSIS — Z8249 Family history of ischemic heart disease and other diseases of the circulatory system: Secondary | ICD-10-CM

## 2019-07-04 DIAGNOSIS — D649 Anemia, unspecified: Secondary | ICD-10-CM | POA: Diagnosis present

## 2019-07-04 DIAGNOSIS — F419 Anxiety disorder, unspecified: Secondary | ICD-10-CM | POA: Diagnosis present

## 2019-07-04 DIAGNOSIS — I420 Dilated cardiomyopathy: Secondary | ICD-10-CM | POA: Diagnosis present

## 2019-07-04 DIAGNOSIS — Z79890 Hormone replacement therapy (postmenopausal): Secondary | ICD-10-CM

## 2019-07-04 DIAGNOSIS — K5641 Fecal impaction: Secondary | ICD-10-CM | POA: Diagnosis present

## 2019-07-04 DIAGNOSIS — R7989 Other specified abnormal findings of blood chemistry: Secondary | ICD-10-CM | POA: Diagnosis present

## 2019-07-04 DIAGNOSIS — K529 Noninfective gastroenteritis and colitis, unspecified: Secondary | ICD-10-CM | POA: Diagnosis not present

## 2019-07-04 DIAGNOSIS — D696 Thrombocytopenia, unspecified: Secondary | ICD-10-CM | POA: Diagnosis present

## 2019-07-04 DIAGNOSIS — Z7901 Long term (current) use of anticoagulants: Secondary | ICD-10-CM

## 2019-07-04 DIAGNOSIS — I959 Hypotension, unspecified: Secondary | ICD-10-CM | POA: Diagnosis present

## 2019-07-04 DIAGNOSIS — R531 Weakness: Secondary | ICD-10-CM | POA: Diagnosis present

## 2019-07-04 DIAGNOSIS — R64 Cachexia: Secondary | ICD-10-CM | POA: Diagnosis present

## 2019-07-04 DIAGNOSIS — C801 Malignant (primary) neoplasm, unspecified: Secondary | ICD-10-CM | POA: Diagnosis present

## 2019-07-04 DIAGNOSIS — Z20822 Contact with and (suspected) exposure to covid-19: Secondary | ICD-10-CM | POA: Diagnosis present

## 2019-07-04 DIAGNOSIS — C787 Secondary malignant neoplasm of liver and intrahepatic bile duct: Secondary | ICD-10-CM | POA: Diagnosis present

## 2019-07-04 DIAGNOSIS — Z951 Presence of aortocoronary bypass graft: Secondary | ICD-10-CM

## 2019-07-04 DIAGNOSIS — I2510 Atherosclerotic heart disease of native coronary artery without angina pectoris: Secondary | ICD-10-CM | POA: Diagnosis present

## 2019-07-04 DIAGNOSIS — Z681 Body mass index (BMI) 19 or less, adult: Secondary | ICD-10-CM

## 2019-07-04 DIAGNOSIS — Z950 Presence of cardiac pacemaker: Secondary | ICD-10-CM

## 2019-07-04 DIAGNOSIS — A419 Sepsis, unspecified organism: Secondary | ICD-10-CM | POA: Diagnosis present

## 2019-07-04 DIAGNOSIS — F0390 Unspecified dementia without behavioral disturbance: Secondary | ICD-10-CM | POA: Diagnosis present

## 2019-07-04 DIAGNOSIS — I5042 Chronic combined systolic (congestive) and diastolic (congestive) heart failure: Secondary | ICD-10-CM | POA: Diagnosis present

## 2019-07-04 DIAGNOSIS — K922 Gastrointestinal hemorrhage, unspecified: Secondary | ICD-10-CM | POA: Diagnosis not present

## 2019-07-04 DIAGNOSIS — K8050 Calculus of bile duct without cholangitis or cholecystitis without obstruction: Secondary | ICD-10-CM | POA: Diagnosis present

## 2019-07-04 DIAGNOSIS — R945 Abnormal results of liver function studies: Secondary | ICD-10-CM | POA: Diagnosis not present

## 2019-07-04 DIAGNOSIS — I255 Ischemic cardiomyopathy: Secondary | ICD-10-CM | POA: Diagnosis present

## 2019-07-04 DIAGNOSIS — I4821 Permanent atrial fibrillation: Secondary | ICD-10-CM | POA: Diagnosis present

## 2019-07-04 DIAGNOSIS — F17210 Nicotine dependence, cigarettes, uncomplicated: Secondary | ICD-10-CM | POA: Diagnosis present

## 2019-07-04 DIAGNOSIS — I130 Hypertensive heart and chronic kidney disease with heart failure and stage 1 through stage 4 chronic kidney disease, or unspecified chronic kidney disease: Secondary | ICD-10-CM | POA: Diagnosis present

## 2019-07-04 DIAGNOSIS — E860 Dehydration: Secondary | ICD-10-CM | POA: Diagnosis present

## 2019-07-04 DIAGNOSIS — K5900 Constipation, unspecified: Secondary | ICD-10-CM | POA: Diagnosis present

## 2019-07-04 DIAGNOSIS — Z9181 History of falling: Secondary | ICD-10-CM

## 2019-07-04 DIAGNOSIS — Z66 Do not resuscitate: Secondary | ICD-10-CM | POA: Diagnosis present

## 2019-07-04 DIAGNOSIS — Z9889 Other specified postprocedural states: Secondary | ICD-10-CM | POA: Diagnosis not present

## 2019-07-04 DIAGNOSIS — K219 Gastro-esophageal reflux disease without esophagitis: Secondary | ICD-10-CM | POA: Diagnosis present

## 2019-07-04 DIAGNOSIS — Z9689 Presence of other specified functional implants: Secondary | ICD-10-CM | POA: Diagnosis present

## 2019-07-04 DIAGNOSIS — E785 Hyperlipidemia, unspecified: Secondary | ICD-10-CM | POA: Diagnosis present

## 2019-07-04 LAB — URINALYSIS, ROUTINE W REFLEX MICROSCOPIC
Bacteria, UA: NONE SEEN
Bilirubin Urine: NEGATIVE
Glucose, UA: NEGATIVE mg/dL
Ketones, ur: NEGATIVE mg/dL
Leukocytes,Ua: NEGATIVE
Nitrite: NEGATIVE
Protein, ur: 30 mg/dL — AB
Specific Gravity, Urine: 1.023 (ref 1.005–1.030)
pH: 5 (ref 5.0–8.0)

## 2019-07-04 LAB — COMPREHENSIVE METABOLIC PANEL
ALT: 54 U/L — ABNORMAL HIGH (ref 0–44)
AST: 104 U/L — ABNORMAL HIGH (ref 15–41)
Albumin: 3.7 g/dL (ref 3.5–5.0)
Alkaline Phosphatase: 377 U/L — ABNORMAL HIGH (ref 38–126)
Anion gap: 13 (ref 5–15)
BUN: 24 mg/dL — ABNORMAL HIGH (ref 8–23)
CO2: 25 mmol/L (ref 22–32)
Calcium: 9 mg/dL (ref 8.9–10.3)
Chloride: 99 mmol/L (ref 98–111)
Creatinine, Ser: 0.73 mg/dL (ref 0.44–1.00)
GFR calc Af Amer: >60 mL/min (ref >60)
GFR calc non Af Amer: >60 mL/min (ref >60)
Glucose, Bld: 183 mg/dL — ABNORMAL HIGH (ref 70–99)
Potassium: 3.8 mmol/L (ref 3.5–5.1)
Sodium: 137 mmol/L (ref 135–145)
Total Bilirubin: 1.6 mg/dL — ABNORMAL HIGH (ref 0.3–1.2)
Total Protein: 6.9 g/dL (ref 6.5–8.1)

## 2019-07-04 LAB — CBC WITH DIFFERENTIAL/PLATELET
Abs Immature Granulocytes: 0.12 10*3/uL — ABNORMAL HIGH (ref 0.00–0.07)
Basophils Absolute: 0.1 10*3/uL (ref 0.0–0.1)
Basophils Relative: 0 %
Eosinophils Absolute: 0.1 10*3/uL (ref 0.0–0.5)
Eosinophils Relative: 1 %
HCT: 30.2 % — ABNORMAL LOW (ref 36.0–46.0)
Hemoglobin: 9.4 g/dL — ABNORMAL LOW (ref 12.0–15.0)
Immature Granulocytes: 1 %
Lymphocytes Relative: 12 %
Lymphs Abs: 1.4 10*3/uL (ref 0.7–4.0)
MCH: 27.3 pg (ref 26.0–34.0)
MCHC: 31.1 g/dL (ref 30.0–36.0)
MCV: 87.8 fL (ref 80.0–100.0)
Monocytes Absolute: 0.8 10*3/uL (ref 0.1–1.0)
Monocytes Relative: 6 %
Neutro Abs: 9.7 10*3/uL — ABNORMAL HIGH (ref 1.7–7.7)
Neutrophils Relative %: 80 %
Platelets: 54 10*3/uL — ABNORMAL LOW (ref 150–400)
RBC: 3.44 MIL/uL — ABNORMAL LOW (ref 3.87–5.11)
RDW: 18.2 % — ABNORMAL HIGH (ref 11.5–15.5)
WBC: 12.1 10*3/uL — ABNORMAL HIGH (ref 4.0–10.5)
nRBC: 0.2 % (ref 0.0–0.2)

## 2019-07-04 LAB — TYPE AND SCREEN
ABO/RH(D): A POS
Antibody Screen: NEGATIVE

## 2019-07-04 LAB — PROTIME-INR
INR: 1.7 — ABNORMAL HIGH (ref 0.8–1.2)
Prothrombin Time: 19.5 seconds — ABNORMAL HIGH (ref 11.4–15.2)

## 2019-07-04 LAB — POC SARS CORONAVIRUS 2 AG -  ED: SARS Coronavirus 2 Ag: NEGATIVE

## 2019-07-04 LAB — LIPASE, BLOOD: Lipase: 91 U/L — ABNORMAL HIGH (ref 11–51)

## 2019-07-04 LAB — LACTIC ACID, PLASMA
Lactic Acid, Venous: 4.7 mmol/L (ref 0.5–1.9)
Lactic Acid, Venous: 4.1 mmol/L (ref 0.5–1.9)

## 2019-07-04 LAB — APTT: aPTT: 31 seconds (ref 24–36)

## 2019-07-04 LAB — POC OCCULT BLOOD, ED: Fecal Occult Bld: POSITIVE — AB

## 2019-07-04 MED ORDER — ACETAMINOPHEN 650 MG RE SUPP: 650.0000 mg | Freq: Four times a day (QID) | RECTAL | Status: DC | PRN

## 2019-07-04 MED ORDER — IOHEXOL 300 MG/ML  SOLN
100.0000 mL | Freq: Once | INTRAMUSCULAR | Status: AC | PRN
  Administered 2019-07-04: 100 mL via INTRAVENOUS

## 2019-07-04 MED ORDER — SODIUM CHLORIDE 0.9 % IV BOLUS (SEPSIS)
250.0000 mL | Freq: Once | INTRAVENOUS | Status: AC
  Administered 2019-07-04: 250 mL via INTRAVENOUS

## 2019-07-04 MED ORDER — SODIUM CHLORIDE 0.9 % IV BOLUS (SEPSIS)
1000.0000 mL | Freq: Once | INTRAVENOUS | Status: AC
  Administered 2019-07-04: 1000 mL via INTRAVENOUS

## 2019-07-04 MED ORDER — AMIODARONE HCL IN DEXTROSE 360-4.14 MG/200ML-% IV SOLN
60.0000 mg/h | INTRAVENOUS | Status: AC
  Administered 2019-07-04: 60 mg/h via INTRAVENOUS
  Filled 2019-07-04: qty 200

## 2019-07-04 MED ORDER — AMIODARONE LOAD VIA INFUSION
150.0000 mg | Freq: Once | INTRAVENOUS | Status: AC
  Administered 2019-07-04: 150 mg via INTRAVENOUS
  Filled 2019-07-04: qty 83.34

## 2019-07-04 MED ORDER — FLEET ENEMA 7-19 GM/118ML RE ENEM
1.0000 | ENEMA | Freq: Two times a day (BID) | RECTAL | Status: DC
  Administered 2019-07-04: 1 via RECTAL

## 2019-07-04 MED ORDER — ONDANSETRON HCL 4 MG PO TABS: 4.0000 mg | ORAL_TABLET | Freq: Four times a day (QID) | ORAL | Status: DC | PRN

## 2019-07-04 MED ORDER — SODIUM CHLORIDE 0.9 % IV SOLN
2.0000 g | Freq: Once | INTRAVENOUS | Status: AC
  Administered 2019-07-04: 2 g via INTRAVENOUS
  Filled 2019-07-04: qty 2

## 2019-07-04 MED ORDER — SODIUM CHLORIDE 0.9 % IV SOLN: INTRAVENOUS | Status: DC

## 2019-07-04 MED ORDER — ACETAMINOPHEN 325 MG PO TABS: 650.0000 mg | ORAL_TABLET | Freq: Four times a day (QID) | ORAL | Status: DC | PRN

## 2019-07-04 MED ORDER — SORBITOL 70 % SOLN
960.0000 mL | TOPICAL_OIL | Freq: Once | ORAL | Status: AC
  Administered 2019-07-04: 960 mL via RECTAL
  Filled 2019-07-04: qty 473

## 2019-07-04 MED ORDER — ONDANSETRON HCL 4 MG/2ML IJ SOLN: 4.0000 mg | Freq: Four times a day (QID) | INTRAMUSCULAR | Status: DC | PRN

## 2019-07-04 MED ORDER — BISACODYL 10 MG RE SUPP
10.0000 mg | Freq: Two times a day (BID) | RECTAL | Status: DC
  Administered 2019-07-04: 10 mg via RECTAL
  Filled 2019-07-04: qty 1

## 2019-07-04 MED ORDER — SODIUM CHLORIDE 0.9 % IV SOLN: 2.0000 g | Freq: Two times a day (BID) | INTRAVENOUS | Status: DC

## 2019-07-04 MED ORDER — METRONIDAZOLE IN NACL 5-0.79 MG/ML-% IV SOLN
500.0000 mg | Freq: Once | INTRAVENOUS | Status: AC
  Administered 2019-07-04: 500 mg via INTRAVENOUS
  Filled 2019-07-04: qty 100

## 2019-07-04 MED ORDER — LEVOTHYROXINE SODIUM 25 MCG PO TABS
50.0000 ug | ORAL_TABLET | Freq: Every day | ORAL | Status: DC
  Filled 2019-07-04: qty 2

## 2019-07-04 MED ORDER — SODIUM CHLORIDE 0.9 % IV SOLN
2.0000 g | INTRAVENOUS | Status: DC
  Administered 2019-07-05: 2 g via INTRAVENOUS
  Filled 2019-07-04: qty 20

## 2019-07-04 MED ORDER — SODIUM CHLORIDE 0.9 % IV BOLUS: 500.0000 mL | Freq: Once | INTRAVENOUS | Status: DC

## 2019-07-04 MED ORDER — METRONIDAZOLE IN NACL 5-0.79 MG/ML-% IV SOLN
500.0000 mg | Freq: Three times a day (TID) | INTRAVENOUS | Status: DC
  Administered 2019-07-05: 500 mg via INTRAVENOUS
  Filled 2019-07-04: qty 100

## 2019-07-04 MED ORDER — AMIODARONE HCL IN DEXTROSE 360-4.14 MG/200ML-% IV SOLN
30.0000 mg/h | INTRAVENOUS | Status: DC
  Administered 2019-07-05: 30 mg/h via INTRAVENOUS
  Filled 2019-07-04: qty 200

## 2019-07-04 MED ORDER — SODIUM CHLORIDE 0.9 % IV BOLUS (SEPSIS)
500.0000 mL | Freq: Once | INTRAVENOUS | Status: AC
  Administered 2019-07-04: 500 mL via INTRAVENOUS

## 2019-07-04 NOTE — ED Provider Notes (Signed)
Pt's care assumed by me at 4pm.  Ct and xrays pending.   Xrays  no acute fractures.  Ct abdomen shows liver lesions not seen in October, constipation. I spoke to Dr. Silverio Decamp on call for pt's Gi doctor who placed stents.  She advised medicine admission and antibiotics. Angelica Hernandez will consult  Hospitalist consulted for admission   Angelica Hernandez 07/01/2019 1939    Angelica Sorrow, MD 07/06/19 [phone]

## 2019-07-04 NOTE — ED Provider Notes (Signed)
[facility] EMERGENCY DEPARTMENT Provider Note   CSN: [id number] Arrival date & time: 06/15/2019  1325     History Chief Complaint  Patient presents with  . Abdominal Pain    Angelica Hernandez is a 86 y.o. female with a hx of coronary artery disease, pacemaker, A. fib, GI bleed with acute anemia, dilated cardiomyopathy, CHF presents to the Emergency Department via EMS with c/o abdominal pain and increasing altered mental status per EMS report.  Pt c/o abd pain, but is unable to provide additional hx.  Per EMS, pt found to be significantly hypoxia with oxygen saturations in the 60's however, pt is very pale with cold extremities and I suspect this was an incorrect reading.  Pt denies respiratory symptoms and EMS reports no respiratory distress.  Level 5 Caveat.   Discussed with daughter, Clarise Cruz, who reports increasing generalized weakness over the last several days, worsening today. Daughter reports patient has mostly been in bed for the last 3 weeks. Daughter reports pt fell the several days ago and has been complaining of back and neck pain since the fall.  Daughter reports patient was attempting to have a BM today and utilized her finger to assist.  Daughter does not know when last BM was, if there was blood. She does report some black stools which has been "ongoing." Daughter reports it has been a long time since her last colonoscopy because patient has been refusing.  Pt is taking Eliquis and daughter reports compliance. Daughter reports son's girlfriend has COVD and daughter is concerned about possible COVID exposure.  Daughter also reports concern about possible dementia due to some confusion ongoing for 2+ weeks.  She reports pt is scheduled to see Neurology for further evaluation for this.    Discussed potential GI bleed with daughter who consents to blood transfusion if needed.  Daughter reports she has been hospitalized for GI bleeding and transfusion prior.  Pt is a patient of Dr. Rush Landmark.      Records reviewed.  Patient had laparoscopic cholecystectomy December 8 of 3086 after a complicated course in November 2020 with choledocholithiasis, cholelithiasis and obstructive jaundice.  She had some complications from her ERCP and underwent biliary stenting.  Daughter reports the stents remain in place.   The history is provided by the patient, a relative, medical records and the EMS personnel. No language interpreter was used.       Past Medical History:  Diagnosis Date  . Anxiety   . CAD (coronary artery disease)   . CKD (chronic kidney disease)   . Dysrhythmia   . Gastro-esophageal reflux disease without esophagitis   . Hypothyroidism   . Ischemic cardiomyopathy   . Jaundice 03/2019  . Mild dilation of ascending aorta (HCC)   . Moderate protein-calorie malnutrition ([place])   . Permanent atrial fibrillation ([place])   . Presence of permanent cardiac pacemaker   . Severe episode of recurrent major depressive disorder, without psychotic features ([place])   . Symptomatic bradycardia     Patient Active Problem List   Diagnosis Date Noted  . Gallstones 05/21/2019  . Chronic combined systolic and diastolic heart failure ([place])   . Choledocholithiasis 05/02/2019  . History of ERCP 05/02/2019  . Gallbladder disease 05/02/2019  . History of anemia 05/02/2019  . Dilated pancreatic duct 05/02/2019  . History of biliary duct stent placement 05/02/2019  . Hypotension 04/18/2019  . DCM (dilated cardiomyopathy) ([place])   . Painless jaundice 04/12/2019  . Hypothyroidism 04/12/2019  . Gastrointestinal hemorrhage   .  Hypercoagulopathy ([place])   . Dilation of biliary tract   . Abnormal LFTs   . Supratherapeutic INR 03/22/2019  . Acute anemia 03/22/2019  . CAD (coronary artery disease) 03/22/2019  . Atrial fibrillation with RVR ([place]) 03/22/2019  . Atrial fibrillation ([place]) 02/14/2018  . Encounter for therapeutic drug monitoring 02/14/2018  . Cardiac pacemaker in situ 01/19/2018     Past Surgical History:  Procedure Laterality Date  . BILIARY DILATION  04/14/2019   Procedure: BILIARY DILATION;  Surgeon: Rush Landmark Telford Nab., MD;  Location: [place];  Service: Gastroenterology;;  . BILIARY STENT PLACEMENT  04/14/2019   Procedure: BILIARY STENT PLACEMENT;  Surgeon: Irving Copas., MD;  Location: [place];  Service: Gastroenterology;;  . BIOPSY  04/14/2019   Procedure: BIOPSY;  Surgeon: Irving Copas., MD;  Location: [place];  Service: Gastroenterology;;  . CESAREAN SECTION    . CHOLECYSTECTOMY N/A 05/21/2019   Procedure: LAPAROSCOPIC CHOLECYSTECTOMY;  Surgeon: Rolm Bookbinder, MD;  Location: [place];  Service: General;  Laterality: N/A;  . COLONOSCOPY N/A 03/26/2019   Procedure: COLONOSCOPY;  Surgeon: Daneil Dolin, MD;  [facility];  Service: Endoscopy;  Laterality: N/A;  . CORONARY ARTERY BYPASS GRAFT  1970   1970s when living in [place]  . ERCP N/A 04/14/2019   Procedure: ENDOSCOPIC RETROGRADE CHOLANGIOPANCREATOGRAPHY (ERCP);  Surgeon: Irving Copas., MD;  Location: [place];  Service: Gastroenterology;  Laterality: N/A;  . ESOPHAGOGASTRODUODENOSCOPY (EGD) WITH PROPOFOL N/A 03/26/2019   Procedure: ESOPHAGOGASTRODUODENOSCOPY (EGD) WITH PROPOFOL;  Surgeon: Daneil Dolin, MD;  [facility];  Service: Endoscopy;  Laterality: N/A;  . ESOPHAGOGASTRODUODENOSCOPY (EGD) WITH PROPOFOL N/A 04/14/2019   Procedure: ESOPHAGOGASTRODUODENOSCOPY (EGD) WITH PROPOFOL;  Surgeon: Rush Landmark Telford Nab., MD;  Location: [place];  Service: Gastroenterology;  Laterality: N/A;  . EUS N/A 04/14/2019   Procedure: UPPER ENDOSCOPIC ULTRASOUND (EUS) LINEAR;  Surgeon: Irving Copas., MD;  Location: [place];  Service: Gastroenterology;  Laterality: N/A;  . HEMOSTASIS CLIP PLACEMENT  04/14/2019   Procedure: HEMOSTASIS CLIP PLACEMENT;  Surgeon: Irving Copas., MD;  Location: [place];   Service: Gastroenterology;;  . OTHER SURGICAL HISTORY     cesearan section   . PACEMAKER GENERATOR CHANGE  06/15/2017   [employer] Accolade SR L [id number] ([id number] [id number]) implanted by Dr Ernestina Patches at [facility] in [place] for symptomatic bradycardia  . PACEMAKER IMPLANT  11/16/2006   PPM implanted for bradycardia  . PANCREATIC STENT PLACEMENT  04/14/2019   Procedure: PANCREATIC STENT PLACEMENT;  Surgeon: Rush Landmark Telford Nab., MD;  Location: [place];  Service: Gastroenterology;;  . REMOVAL OF STONES  04/14/2019   Procedure: REMOVAL OF STONES;  Surgeon: Irving Copas., MD;  Location: [place];  Service: Gastroenterology;;  . Joan Mayans  04/14/2019   Procedure: Joan Mayans;  Surgeon: Irving Copas., MD;  [facility] ENDOSCOPY;  Service: Gastroenterology;;     OB History   No obstetric history on file.     Family History  Problem Relation Age of Onset  . Heart disease Mother   . Hypertension Mother   . Diabetes Mother   . Hypertension Father   . Heart disease Father   . Hypertension Brother   . Colon cancer Neg Hx   . Esophageal cancer Neg Hx   . Inflammatory bowel disease Neg Hx   . Pancreatic cancer Neg Hx   . Stomach cancer Neg Hx   . Rectal cancer Neg Hx     Social History  Tobacco Use  . Smoking status: Current Every Day Smoker    Packs/day: 0.50    Years: 72.00    Pack years: 36.00    Types: Cigarettes  . Smokeless tobacco: Never Used  Substance Use Topics  . Alcohol use: Never  . Drug use: Never    Home Medications Prior to Admission medications   Medication Sig Start Date End Date Taking? Authorizing Provider  apixaban (ELIQUIS) 2.5 MG TABS tablet Take 1 tablet (2.5 mg total) by mouth 2 (two) times daily. 05/02/19 10/29/19 Yes Herminio Commons, MD  atorvastatin (LIPITOR) 10 MG tablet Take 10 mg by mouth daily.   Yes [provider]  DULoxetine (CYMBALTA) 30 MG capsule Take 30 mg by  mouth daily.  03/01/19  Yes [provider]  FERREX 150 150 MG capsule Take 150 mg by mouth daily.  02/07/18  Yes [provider]  gabapentin (NEURONTIN) 100 MG capsule Take 100 mg by mouth daily as needed. 05/24/19  Yes [provider]  lansoprazole (PREVACID) 30 MG capsule Take 30 mg by mouth 2 (two) times daily before a meal.   Yes [provider]  levothyroxine (SYNTHROID, LEVOTHROID) 50 MCG tablet Take 50 mcg by mouth daily before breakfast.   Yes [provider]  metoprolol succinate (TOPROL-XL) 100 MG 24 hr tablet Take 100 mg by mouth 2 (two) times daily. 04/18/19  Yes [provider]  nitroGLYCERIN (NITROSTAT) 0.4 MG SL tablet Place 1 tablet (0.4 mg total) under the tongue every 5 (five) minutes as needed for chest pain. 09/18/18  Yes Herminio Commons, MD  pantoprazole (PROTONIX) 40 MG tablet Take 1 tablet (40 mg total) by mouth 2 (two) times daily. Patient taking differently: Take 40 mg by mouth daily.  05/01/19 07/07/2019 Yes Mansouraty, Telford Nab., MD  Polyethyl Glycol-Propyl Glycol (SYSTANE OP) Apply 1 drop to eye 3 (three) times daily as needed (dry eyes).   Yes [provider]    Allergies    Patient has no known allergies.  Review of Systems   Review of Systems  Unable to perform ROS: Mental status change  Constitutional: Positive for fatigue.  Gastrointestinal: Positive for abdominal pain.  Neurological: Positive for weakness.    Physical Exam Updated Vital Signs BP 103/68 (BP Location: Left Arm)   Resp 16   Wt 52.8 kg   SpO2 98%   BMI 18.80 kg/m   Physical Exam Vitals and nursing note reviewed. Exam conducted with a chaperone present.  Constitutional:      General: She is not in acute distress.    Appearance: She is ill-appearing. She is not diaphoretic.  HENT:     Head: Normocephalic.  Eyes:     General: No scleral icterus.    Conjunctiva/sclera: Conjunctivae normal.  Cardiovascular:     Rate and  Rhythm: Regular rhythm. Tachycardia present.     Pulses: Normal pulses.          Radial pulses are 2+ on the right side and 2+ on the left side.  Pulmonary:     Effort: No tachypnea, accessory muscle usage, prolonged expiration, respiratory distress or retractions.     Breath sounds: No stridor.     Comments: Equal chest rise. No increased work of breathing. Abdominal:     General: There is no distension.     Palpations: Abdomen is soft.     Tenderness: There is generalized abdominal tenderness. There is no right CVA tenderness, left CVA tenderness, guarding or rebound.  Genitourinary:    Exam position: Knee-chest position.     Rectum: Guaiac result positive. No anal fissure or external hemorrhoid.     Comments: Loose black stool in rectal vault. Musculoskeletal:     Cervical back: Normal range of motion.     Right foot: Tenderness present.       Legs:     Comments: Moves all extremities equally and without difficulty. No step-off or deformity to the C-spine, T-spine or L-spine  Skin:    General: Skin is warm and dry.     Capillary Refill: Capillary refill takes less than 2 seconds.     Coloration: Skin is pale.  Neurological:     Mental Status: She is alert.     GCS: GCS eye subscore is 4. GCS verbal subscore is 4. GCS motor subscore is 5.     Comments: Pt seems somewhat confused, but does follow commands. No focal weakness.  Psychiatric:        Mood and Affect: Mood normal.     ED Results / Procedures / Treatments   Labs (all labs ordered are listed, but only abnormal results are displayed) Labs Reviewed  LACTIC ACID, PLASMA - Abnormal; Notable for the following components:      Result Value   Lactic Acid, Venous 4.7 (*)    All other components within normal limits  COMPREHENSIVE METABOLIC PANEL - Abnormal; Notable for the following components:   Glucose, Bld 183 (*)    BUN 24 (*)    AST 104 (*)    ALT 54 (*)    Alkaline Phosphatase 377 (*)    Total Bilirubin 1.6  (*)    All other components within normal limits  CBC WITH DIFFERENTIAL/PLATELET - Abnormal; Notable for the following components:   WBC 12.1 (*)    RBC 3.44 (*)    Hemoglobin 9.4 (*)    HCT 30.2 (*)    RDW 18.2 (*)    Platelets 54 (*)    Neutro Abs 9.7 (*)    Abs Immature Granulocytes 0.12 (*)    All other components within normal limits  PROTIME-INR - Abnormal; Notable for the following components:   Prothrombin Time 19.5 (*)    INR 1.7 (*)    All other components within normal limits  LIPASE, BLOOD - Abnormal; Notable for the following components:   Lipase 91 (*)    All other components within normal limits  POC OCCULT BLOOD, ED - Abnormal; Notable for the following components:   Fecal Occult Bld POSITIVE (*)    All other components within normal limits  URINE CULTURE  CULTURE, BLOOD (ROUTINE X 2)  CULTURE, BLOOD (ROUTINE X 2)  APTT  LACTIC ACID, PLASMA  URINALYSIS, ROUTINE W REFLEX MICROSCOPIC  POC SARS CORONAVIRUS 2 AG -  ED  TYPE AND SCREEN    EKG EKG Interpretation  Date/Time:  Wednesday July 04 2019 13:56:46 EST Ventricular Rate:  137 PR Interval:    QRS Duration: 83 QT Interval:  316 QTC Calculation: 477 R Axis:   74 Text Interpretation: Atrial fibrillation RSR' in V1 or V2, probably normal variant Repolarization abnormality, prob rate related Confirmed by Milton Ferguson [phone]) on 06/21/2019 3:27:28 PM    Radiology DG Chest Port 1 View  Result Date: 07/07/2019 CLINICAL DATA:  Altered mental status today. EXAM: PORTABLE CHEST 1 VIEW COMPARISON:  Single-view of the chest 04/14/2019. FINDINGS: Pacing device remains in place. The patient is status post CABG. Lungs are clear. Heart size is  upper normal. No pneumothorax or pleural fluid. No acute or focal bony abnormality. IMPRESSION: No acute disease. Electronically Signed   By: Inge Rise M.D.   On: 07/02/2019 14:23    Procedures .Critical Care Performed by: Abigail Butts, PA-C Authorized by:  Abigail Butts, PA-C   Critical care provider statement:    Critical care time (minutes):  65   Critical care time was exclusive of:  Separately billable procedures and treating other patients and teaching time   Critical care was necessary to treat or prevent imminent or life-threatening deterioration of the following conditions:  Sepsis   Critical care was time spent personally by me on the following activities:  Discussions with consultants, evaluation of patient's response to treatment, examination of patient, ordering and performing treatments and interventions, ordering and review of laboratory studies, ordering and review of radiographic studies, pulse oximetry, re-evaluation of patient's condition, obtaining history from patient or surrogate and review of old charts   I assumed direction of critical care for this patient from another provider in my specialty: no     (including critical care time)  Medications Ordered in ED Medications  sodium chloride 0.9 % bolus 1,000 mL (has no administration in time range)    And  sodium chloride 0.9 % bolus 500 mL (has no administration in time range)    And  sodium chloride 0.9 % bolus 250 mL (has no administration in time range)  ceFEPIme (MAXIPIME) 2 g in sodium chloride 0.9 % 100 mL IVPB (has no administration in time range)  metroNIDAZOLE (FLAGYL) IVPB 500 mg (has no administration in time range)  ceFEPIme (MAXIPIME) 2 g in sodium chloride 0.9 % 100 mL IVPB (has no administration in time range)    ED Course  I have reviewed the triage vital signs and the nursing notes.  Pertinent labs & imaging results that were available during my care of the patient were reviewed by me and considered in my medical decision making (see chart for details).  Clinical Course as of Jul 03 1645  Wed Jul 04, 2019  1527 Rapid a-fib.  Pt with hx of same and suspect this is 2/2 severe anemia. Awaiting labs.  Will give small fluid bolus while awaiting.    ED EKG 12-Lead [HM]  1528 Lactic acid 4.7 -sepsis fluids initiated  Lactic Acid, Venous(!!): 4.7 [HM]  1541 Hemoglobin 9.4.  Patient not severely anemic as anticipated.  Hemoglobin(!): 9.4 [HM]  1541 She does have recurrent elevation in AST, ALT, alk phos and total bili.  Given recent cholecystectomy and retained stents, suspect this is the site of her infection.  CT scan pending.  Antibiotics initiated  AST(!): 104 [HM]  1542 elevated  Lipase(!): 91 [HM]    Clinical Course User Index [HM] Darrow Barreiro, Gwenlyn Perking   MDM Rules/Calculators/A&P                       Patient presents with generalized weakness.  On exam she is pale and extremities are cool.  Oxygen saturations are within normal limits here and she has no respiratory distress.  Breath sounds are clear and equal.  Rectal exam with black, tarry stools.  Fecal occult positive.  She is anticoagulated on Eliquis.  Suspect severe acute blood loss anemia due to GI bleed as the cause of her generalized weakness.  Given several falls over the last few days, anticoagulation and complaints of back pain will obtain CT scan head neck and plain films of  her back.  Labs pending.  Also, some concern about potential Covid exposure however she has had no additional Covid type symptoms.  3:42 PM Labs resulted.  Patient with significantly elevated lactic acid at 4.7.  Additionally elevation in liver enzymes, pancreatic enzymes and total bili gives rise to concern for complication from previous cholecystectomy.  Code sepsis initiated, sepsis fluids and antibiotics begun.  Hemoglobin is 9.4.  Given this her A. fib with RVR is likely secondary to her sepsis and less likely secondary to her anemia.  We will not give blood at this time.    CT scans pending.  Patient will be admitted and may need surgical consult as I suspect the source of infection is abdominal.  At shift change care was transferred to Alyse Low, PA-C who will follow pending studies,  re-evaulate and determine disposition.    The patient was discussed with and seen by Dr. Roderic Palau who agrees with the treatment plan.   Final Clinical Impression(s) / ED Diagnoses Final diagnoses:  Generalized weakness  Gastrointestinal hemorrhage, unspecified gastrointestinal hemorrhage type  Current use of long term anticoagulation  Fall, initial encounter  Sepsis, due to unspecified organism, unspecified whether acute organ dysfunction present [facility])    Rx / DC Orders ED Discharge Orders    None       Loni Muse Gwenlyn Perking 07/12/2019 1648    Milton Ferguson, MD 07/06/19 1035

## 2019-07-04 NOTE — ED Triage Notes (Signed)
EMS says was called out for abd pain.  Reports pt has had abd pain and constipation x 1 week.  Reports tried to disimpact herself and passed small amount of dark brown stool and blood.  Reports some confusion when they arrived and o2 sat 62 when they arrived but pt did not appear sob.  Reports o2 sat fluctuating but couldn't get it above 75%.  EMS put pt on nrb and continued to fluctuate but did go to 100% but continued to fluctuate.  Denies any resp symptoms.

## 2019-07-04 NOTE — H&P (Addendum)
[facility] H&P   Patient Demographics:    Angelica Hernandez, is a 86 y.o. female  MRN: [id number]   DOB - [birth year]  Admit Date - 07/02/2019  Outpatient Primary MD for the patient is Glenis Smoker, MD  Referring MD/NP/PA: [place]  Outpatient Specialists: GI Dr Daiva Nakayama  Patient coming from: Home  Chief Complaint  Patient presents with  . Abdominal Pain      HPI:    Angelica Hernandez  is a 86 y.o. female, with a hx of coronary artery disease, pacemaker, A. fib, GI bleed with acute anemia, dilated cardiomyopathy, CHF, she was brought to ED by her daughter, secondary to increased confusion, and abdominal pain, patient is demented, poor historian, history was obtained from daughter, patient presents with abdominal pain for last 2 days, per daughter she had no bowel movement for few days as well, as well she has very poor oral intake for few days, as well she has been more confused over last 2 weeks, gradual with progressive generalized weakness, there was no report of fever, chills, nausea or vomiting, patient is with known A. fib, on Eliquis.  Patient with known history of choledocholithiasis, status post ERCP with leaking, required biliary stent, she had lap cholecystectomy 05/21/2019. -In ED patient was noted to be with low blood pressure,  A. fib with RVR heart rate in the 150s, as well CT abdomen and pelvis significant for large stool S[place], and new findings of liver attenuation suspected for metastasis, as well patient was Hemoccult positive, she had manual disimpaction in ED, she was diagnosed with septic, started empirically on cefepime, and I was called to admit.    Review of systems:    Patient is demented, confused, cannot give appropriate history.   With Past History of the following :    Past Medical History:  Diagnosis Date  . Anxiety   . CAD (coronary artery disease)     . CKD (chronic kidney disease)   . Dysrhythmia   . Gastro-esophageal reflux disease without esophagitis   . Hypothyroidism   . Ischemic cardiomyopathy   . Jaundice 03/2019  . Mild dilation of ascending aorta (HCC)   . Moderate protein-calorie malnutrition ([place])   . Permanent atrial fibrillation ([place])   . Presence of permanent cardiac pacemaker   . Severe episode of recurrent major depressive disorder, without psychotic features ([place])   . Symptomatic bradycardia       Past Surgical History:  Procedure Laterality Date  . BILIARY DILATION  04/14/2019   Procedure: BILIARY DILATION;  Surgeon: Rush Landmark Telford Nab., MD;  Location: [place];  Service: Gastroenterology;;  . BILIARY STENT PLACEMENT  04/14/2019   Procedure: BILIARY STENT PLACEMENT;  Surgeon: Irving Copas., MD;  Location: [place];  Service: Gastroenterology;;  . BIOPSY  04/14/2019   Procedure: BIOPSY;  Surgeon: Irving Copas., MD;  Location: [place];  Service: Gastroenterology;;  .  CESAREAN SECTION    . CHOLECYSTECTOMY N/A 05/21/2019   Procedure: LAPAROSCOPIC CHOLECYSTECTOMY;  Surgeon: Rolm Bookbinder, MD;  Location: [facility];  Service: General;  Laterality: N/A;  . COLONOSCOPY N/A 03/26/2019   Procedure: COLONOSCOPY;  Surgeon: Daneil Dolin, MD;  [facility];  Service: Endoscopy;  Laterality: N/A;  . CORONARY ARTERY BYPASS GRAFT  1970   1970s when living in [place]  . ERCP N/A 04/14/2019   Procedure: ENDOSCOPIC RETROGRADE CHOLANGIOPANCREATOGRAPHY (ERCP);  Surgeon: Irving Copas., MD;  Location: [facility];  Service: Gastroenterology;  Laterality: N/A;  . ESOPHAGOGASTRODUODENOSCOPY (EGD) WITH PROPOFOL N/A 03/26/2019   Procedure: ESOPHAGOGASTRODUODENOSCOPY (EGD) WITH PROPOFOL;  Surgeon: Daneil Dolin, MD;  [facility];  Service: Endoscopy;  Laterality: N/A;  . ESOPHAGOGASTRODUODENOSCOPY (EGD) WITH PROPOFOL N/A 04/14/2019   Procedure:  ESOPHAGOGASTRODUODENOSCOPY (EGD) WITH PROPOFOL;  Surgeon: Rush Landmark Telford Nab., MD;  Location: [facility];  Service: Gastroenterology;  Laterality: N/A;  . EUS N/A 04/14/2019   Procedure: UPPER ENDOSCOPIC ULTRASOUND (EUS) LINEAR;  Surgeon: Irving Copas., MD;  Location: [facility];  Service: Gastroenterology;  Laterality: N/A;  . HEMOSTASIS CLIP PLACEMENT  04/14/2019   Procedure: HEMOSTASIS CLIP PLACEMENT;  Surgeon: Irving Copas., MD;  Location: [facility];  Service: Gastroenterology;;  . OTHER SURGICAL HISTORY     cesearan section   . PACEMAKER GENERATOR CHANGE  06/15/2017   [employer] Accolade SR L [id number] ([place] [id number]) implanted by Dr Ernestina Patches at [facility] in [place] for symptomatic bradycardia  . PACEMAKER IMPLANT  11/16/2006   PPM implanted for bradycardia  . PANCREATIC STENT PLACEMENT  04/14/2019   Procedure: PANCREATIC STENT PLACEMENT;  Surgeon: Rush Landmark Telford Nab., MD;  Location: [place];  Service: Gastroenterology;;  . REMOVAL OF STONES  04/14/2019   Procedure: REMOVAL OF STONES;  Surgeon: Irving Copas., MD;  Location: [facility];  Service: Gastroenterology;;  . Joan Mayans  04/14/2019   Procedure: Joan Mayans;  Surgeon: Irving Copas., MD;  [facility] END[facility];  Service: Gastroenterology;;      Social History:     Social History   Tobacco Use  . Smoking status: Current Every Day Smoker    Packs/day: 0.50    Years: 72.00    Pack years: 36.00    Types: Cigarettes  . Smokeless tobacco: Never Used  Substance Use Topics  . Alcohol use: Never      Family History :     Family History  Problem Relation Age of Onset  . Heart disease Mother   . Hypertension Mother   . Diabetes Mother   . Hypertension Father   . Heart disease Father   . Hypertension Brother   . Colon cancer Neg Hx   . Esophageal cancer Neg Hx   . Inflammatory bowel disease Neg Hx   . Pancreatic cancer  Neg Hx   . Stomach cancer Neg Hx   . Rectal cancer Neg Hx      Home Medications:   Prior to Admission medications   Medication Sig Start Date End Date Taking? Authorizing Provider  apixaban (ELIQUIS) 2.5 MG TABS tablet Take 1 tablet (2.5 mg total) by mouth 2 (two) times daily. 05/02/19 10/29/19 Yes Herminio Commons, MD  atorvastatin (LIPITOR) 10 MG tablet Take 10 mg by mouth daily.   Yes [provider]  DULoxetine (CYMBALTA) 30 MG capsule Take 30 mg by mouth daily.  03/01/19  Yes [provider]  FERREX 150 150 MG capsule Take 150 mg by  mouth daily.  02/07/18  Yes [provider]  gabapentin (NEURONTIN) 100 MG capsule Take 100 mg by mouth daily as needed. 05/24/19  Yes [provider]  lansoprazole (PREVACID) 30 MG capsule Take 30 mg by mouth 2 (two) times daily before a meal.   Yes [provider]  levothyroxine (SYNTHROID, LEVOTHROID) 50 MCG tablet Take 50 mcg by mouth daily before breakfast.   Yes [provider]  metoprolol succinate (TOPROL-XL) 100 MG 24 hr tablet Take 100 mg by mouth 2 (two) times daily. 04/18/19  Yes [provider]  nitroGLYCERIN (NITROSTAT) 0.4 MG SL tablet Place 1 tablet (0.4 mg total) under the tongue every 5 (five) minutes as needed for chest pain. 09/18/18  Yes Herminio Commons, MD  pantoprazole (PROTONIX) 40 MG tablet Take 1 tablet (40 mg total) by mouth 2 (two) times daily. Patient taking differently: Take 40 mg by mouth daily.  05/01/19 07/03/2019 Yes Mansouraty, Telford Nab., MD  Polyethyl Glycol-Propyl Glycol (SYSTANE OP) Apply 1 drop to eye 3 (three) times daily as needed (dry eyes).   Yes [provider]     Allergies:    No Known Allergies   Physical Exam:   Vitals  Blood pressure 103/80, pulse 60, temperature (!) 97.1 F (36.2 C), temperature source Rectal, resp. rate 18, weight 52.8 kg, SpO2 97 %.   1. General extremely frail, elderly female, laying in bed in mild  discomfort  2.  Confused, demented, easily distracted , Awake Alert, Oriented X 1.  3. No F.N deficits, ALL C.Nerves Intact, Strength 5/5 all 4 extremities, Sensation intact all 4 extremities, Plantars down going.  4. Ears and Eyes appear Normal, Conjunctivae clear, PERRLA. Moist Oral Mucosa.  5. Supple Neck, No JVD, No cervical lymphadenopathy appriciated, No Carotid Bruits.  6. Symmetrical Chest wall movement, Good air movement bilaterally, CTAB.  7.  Irregular irregular, No Gallops, Rubs or Murmurs, No Parasternal Heave.  8. Positive Bowel Sounds, Abdomen Soft, left lower abdomen tenderness, No organomegaly appriciated,No rebound -guarding or rigidity.  9.  No Cyanosis, delayed skin Turgor, No Skin Rash or Bruise.  10. Good muscle tone, but some muscle wasting joints appear normal , no effusions, Normal ROM.  11. No Palpable Lymph Nodes in  Axillae    Data Review:    CBC Recent Labs  Lab 06/22/2019 1457  WBC 12.1*  HGB 9.4*  HCT 30.2*  PLT 54*  MCV 87.8  MCH 27.3  MCHC 31.1  RDW 18.2*  LYMPHSABS 1.4  MONOABS 0.8  EOSABS 0.1  BASOSABS 0.1   ------------------------------------------------------------------------------------------------------------------  Chemistries  Recent Labs  Lab 06/19/2019 1457  NA 137  K 3.8  CL 99  CO2 25  GLUCOSE 183*  BUN 24*  CREATININE 0.73  CALCIUM 9.0  AST 104*  ALT 54*  ALKPHOS 377*  BILITOT 1.6*   ------------------------------------------------------------------------------------------------------------------ estimated creatinine clearance is 44.4 mL/min (by C-G formula based on SCr of 0.73 mg/dL). ------------------------------------------------------------------------------------------------------------------ No results for input(s): TSH, T4TOTAL, T3FREE, THYROIDAB in the last 72 hours.  Invalid input(s): FREET3  Coagulation profile Recent Labs  Lab 06/15/2019 1457  INR 1.7*    ------------------------------------------------------------------------------------------------------------------- No results for input(s): DDIMER in the last 72 hours. -------------------------------------------------------------------------------------------------------------------  Cardiac Enzymes No results for input(s): CKMB, TROPONINI, MYOGLOBIN in the last 168 hours.  Invalid input(s): CK ------------------------------------------------------------------------------------------------------------------ No results found for: BNP   ---------------------------------------------------------------------------------------------------------------  Urinalysis    Component Value Date/Time   COLORURINE YELLOW 07/03/2019 1352   APPEARANCEUR HAZY (A) 06/18/2019 1352  LABSPEC 1.023 07/10/2019 1352   PHURINE 5.0 07/11/2019 1352   GLUCOSEU NEGATIVE 07/08/2019 1352   HGBUR SMALL (A) 07/14/2019 1352   [place] 06/26/2019 1352   [place] 06/26/2019 1352   PROTEINUR 30 (A) 06/22/2019 1352   NITRITE NEGATIVE 07/01/2019 1352   LEUKOCYTESUR NEGATIVE 06/16/2019 1352    ----------------------------------------------------------------------------------------------------------------   Imaging Results:    DG Thoracic Spine 2 View  Result Date: 06/19/2019 CLINICAL DATA:  Ecchymosis. Additional history provided by technologist: EXAM: THORACIC SPINE 2 VIEWS COMPARISON:  Chest radiograph 07/13/2019 FINDINGS: Partially visualized single lead AICD device. Heart size within normal limits. Aortic atherosclerosis. Prior median sternotomy. Cardiac monitoring leads. The bones are diffusely demineralized. Exaggerated thoracic kyphosis. No significant spondylolisthesis. No thoracic compression deformity is identified. Mild multilevel disc height loss with endplate spurring. IMPRESSION: No thoracic compression deformity is identified. Thoracic spondylosis. Diffuse osseous  demineralization. Electronically Signed   By: Kellie Simmering DO   On: 07/10/2019 17:45   DG Lumbar Spine Complete  Result Date: 06/18/2019 CLINICAL DATA:  Ecchymosis EXAM: LUMBAR SPINE - COMPLETE 4+ VIEW COMPARISON:  CT 06/15/2019, 04/14/2019 FINDINGS: Biliary and probable pancreatic duct stent. Excreted contrast in the kidneys. Chronic superior endplate deformity at L1. Alignment normal. Remaining vertebral body heights are normal. Mild degenerative osteophytes. IMPRESSION: Chronic superior endplate deformity at L1. No acute osseous abnormality. Electronically Signed   By: Donavan Foil M.D.   On: 07/07/2019 17:52   CT Head Wo Contrast  Result Date: 06/28/2019 CLINICAL DATA:  Status post trauma. EXAM: CT HEAD WITHOUT CONTRAST TECHNIQUE: Contiguous axial images were obtained from the base of the skull through the vertex without intravenous contrast. COMPARISON:  June 25, 2019 FINDINGS: Brain: There is moderate severity cerebral atrophy with widening of the extra-axial spaces and ventricular dilatation. There are areas of decreased attenuation within the white matter tracts of the supratentorial brain, consistent with microvascular disease changes. A small area of cortical encephalomalacia, with adjacent chronic white matter low attenuation is seen within the left occipital lobe. Vascular: No hyperdense vessel or unexpected calcification. Skull: Normal. Negative for fracture or focal lesion. Sinuses/Orbits: No acute finding. Other: None. IMPRESSION: 1. Generalized cerebral atrophy. 2. Small chronic left occipital lobe infarct. 3. No acute intracranial abnormality. Electronically Signed   By: Virgina Norfolk M.D.   On: 07/04/2019 17:47   CT Cervical Spine Wo Contrast  Result Date: 07/04/2019 CLINICAL DATA:  Status post trauma. EXAM: CT CERVICAL SPINE WITHOUT CONTRAST TECHNIQUE: Multidetector CT imaging of the cervical spine was performed without intravenous contrast. Multiplanar CT image reconstructions  were also generated. COMPARISON:  None. FINDINGS: Alignment: Normal. Skull base and vertebrae: No acute fracture. No primary bone lesion or focal pathologic process. Soft tissues and spinal canal: No prevertebral fluid or swelling. No visible canal hematoma. Disc levels: C2-3: Normal endplates. Mild disc space narrowing is seen. Bilateral facet hypertrophy is noted. Normal central canal and intervertebral neuroforamina. C3-4: Normal endplates. Mild disc space narrowing is seen. Bilateral facet hypertrophy is noted. Normal central canal and intervertebral neuroforamina. C4-5: Normal endplates. Mild disc space narrowing is seen. Bilateral facet hypertrophy is noted. Normal central canal and intervertebral neuroforamina. C5-6: Mild end plate spondylosis. Moderate severity disc space narrowing is seen. Bilateral facet hypertrophy is noted. Normal central canal and intervertebral neuroforamina. C6-7: There is mild end plate spondylosis. Moderate severity disc space narrowing is seen. Bilateral facet hypertrophy is noted. Normal central canal and intervertebral neuroforamina. C7-T1: Normal endplates. Mild to moderate severity disc space narrowing is seen. Bilateral  facet hypertrophy is noted. Normal central canal and intervertebral neuroforamina. Upper chest: Mild biapical scarring is seen with mild areas of biapical parenchymal calcification. Other: None. IMPRESSION: 1. Multilevel degenerative disc disease and facet hypertrophy, without central canal or foraminal narrowing. 2. No acute osseous abnormality. Electronically Signed   By: Virgina Norfolk M.D.   On: 07/09/2019 17:51   CT ABDOMEN PELVIS W CONTRAST  Result Date: 07/06/2019 CLINICAL DATA:  Abdominal pain. EXAM: CT ABDOMEN AND PELVIS WITH CONTRAST TECHNIQUE: Multidetector CT imaging of the abdomen and pelvis was performed using the standard protocol following bolus administration of intravenous contrast. CONTRAST:  190mL OMNIPAQUE IOHEXOL 300 MG/ML  SOLN  COMPARISON:  April 14, 2019 FINDINGS: Lower chest: Multiple sternal wires are noted. Very mild atelectasis is seen along the posterior aspect of the right lung base. There is a small right pleural effusion. Hepatobiliary: Innumerable heterogeneous low-attenuation liver lesions are seen. Status post cholecystectomy. A common bile duct stent is in place. Pancreas: There is mild dilatation of the pancreatic duct (6 mm). The remainder of the pancreas is unremarkable. Spleen: The spleen is small in size and slightly heterogeneous in appearance. Adrenals/Urinary Tract: Adrenal glands are unremarkable. Kidneys are normal, without renal calculi or hydronephrosis. A stable 1.2 cm simple cyst is seen within the mid right kidney. A 1.0 cm area of low attenuation is seen within the lower pole of the left kidney. This represents a new finding compared to the prior exam. Bladder is unremarkable. Stomach/Bowel: Stomach is within normal limits. The appendix is not identified. No evidence of bowel wall thickening, distention, or inflammatory changes. A very large amount of stool is seen within the distal sigmoid colon and rectum. Vascular/Lymphatic: Marked severity aortic atherosclerosis. No enlarged abdominal or pelvic lymph nodes. Reproductive: Status post hysterectomy. No adnexal masses. Other: No abdominal wall hernia or abnormality. No abdominopelvic ascites. Musculoskeletal: Multilevel degenerative changes seen throughout the lumbar spine. IMPRESSION: 1. Very large amount of stool in the distal sigmoid colon and rectum, consistent with fecal impaction. 2. Innumerable heterogeneous low-attenuation lesions in the liver. This represents a new finding when compared to the prior study dated April 14, 2019 and is concerning for metastatic disease. 3. Small right pleural effusion. 4. Evidence of prior cholecystectomy. 5. Common bile duct stent. 6. New 1.0 cm area of low attenuation within the lower pole of the left kidney. While  this may represent a small renal cyst, correlation with renal ultrasound is recommended to exclude the presence of an associated soft tissue component. Aortic Atherosclerosis (ICD10-I70.0). Electronically Signed   By: Virgina Norfolk M.D.   On: 06/17/2019 17:37   DG Chest Port 1 View  Result Date: 06/19/2019 CLINICAL DATA:  Altered mental status today. EXAM: PORTABLE CHEST 1 VIEW COMPARISON:  Single-view of the chest 04/14/2019. FINDINGS: Pacing device remains in place. The patient is status post CABG. Lungs are clear. Heart size is upper normal. No pneumothorax or pleural fluid. No acute or focal bony abnormality. IMPRESSION: No acute disease. Electronically Signed   By: Inge Rise M.D.   On: 06/20/2019 14:23   DG Foot Complete Right  Result Date: 06/19/2019 CLINICAL DATA:  Ecchymosis EXAM: RIGHT FOOT COMPLETE - 3+ VIEW COMPARISON:  None. FINDINGS: No acute displaced fracture or malalignment. Soft tissues are unremarkable. Clips adjacent to the distal tibia IMPRESSION: No acute osseous abnormality Electronically Signed   By: Donavan Foil M.D.   On: 06/23/2019 17:48    My personal review of EKG: Rhythm A fib , Rate  137 /min, QTc 447 , no Acute ST changes   Assessment & Plan:    Active Problems:   Cardiac pacemaker in situ   Atrial fibrillation (HCC)   Acute anemia   CAD (coronary artery disease)   Abnormal LFTs   DCM (dilated cardiomyopathy) (HCC)   Hypotension   Choledocholithiasis   History of biliary duct stent placement   Chronic combined systolic and diastolic heart failure ([place])   Sepsis ([place])  Sepsis/stercoral colitis -Sepsis present on admission, she is tachycardic, hypotensive, elevated lactic acid, leukocytosis. -Sepsis most likely related to sterocoral colitis, getting UA, and chest x-ray, follow-up blood cultures. -Continue with IV fluids, given soft blood pressure. -her bile duct stent is unlikely source of her sepsis, given total bili is not that elevated, and  she has no right upper quadrant pain. -Patient will be kept on Rocephin and Flagyl -Will consult GI -Keep n.p.o., was disimpacted in ED, rest of the stool high in the vault, will give smog enema tonight, and continue with Fleet and Dulcolax suppository. -Hemoccult positive stool, most likely in setting of stercoral colitis, hold Eliquis.  A. fib with RVR -Rate extremely uncontrolled, will start on amiodarone drip given soft blood pressure, hold metoprolol. -We will hold anticoagulation given thrombocytopenia and Hemoccult positive stool.  Thrombocytopenia -Likely in setting of sepsis, will hold Eliquis.  Hypertension -blood Pressure is soft, hold metoprolol.  Multiple liver nodules -New finding, will check CT chest to look for primary.  Hypothyroidism -resume levothyroxine  Hyperlipidemia -Resume statin when stable   DVT Prophylaxis  - SCDs  AM Labs Ordered, also please review Full Orders  Family Communication: Admission, patients condition and plan of care including tests being ordered have been discussed with the patient and daughter via phone who indicate understanding and agree with the plan and Code Status.  Code Status Full  Likely DC to  Home  Condition GUARDED    Consults called: None  Admission status: inpatient  Time spent in minutes : 60 Minutes   Phillips Climes M.D on 06/24/2019 at 8:47 PM  Between 7am to 7pm - Pager - [phone]. After 7pm go to [email or web address] - password [facility]  [facility] - Office  [phone]

## 2019-07-04 NOTE — Progress Notes (Signed)
Pharmacy Antibiotic Note  Angelica Hernandez is a 86 y.o. female admitted on 06/19/2019 with intra-abdominal infection.  Pharmacy has been consulted for Cefepime dosing.  Plan: Cefepime 2000 mg IV every 12 hours.  Flagyl 500 mg IV every 8 hours. Monitor labs, c/s, and patient improvement.  Weight: 116 lb 6.5 oz (52.8 kg)  No data recorded.  Recent Labs  Lab 07/15/2019 1457  WBC 12.1*  CREATININE 0.73  LATICACIDVEN 4.7*    Estimated Creatinine Clearance: 44.4 mL/min (by C-G formula based on SCr of 0.73 mg/dL).    No Known Allergies  Antimicrobials this admission: Cefepime 1/20 >>  Flagyl 1/20 >>   Dose adjustments this admission: Cefepime  Microbiology results:  1/20 UCx: pending   Thank you for allowing pharmacy to be a part of this patient's care.  Ramond Craver 06/15/2019 3:51 PM

## 2019-07-04 NOTE — Progress Notes (Addendum)
Attempting to reach bedside RN to inquire about status of blood cultures, antibiotic and fluid administration. Unable to reach via secure chat. Called ED. Message taken by ED staff. Awaiting return call.

## 2019-07-04 NOTE — ED Triage Notes (Signed)
Per PCP, pt tried to disimpact herself and starting having rectal bleeding. Pt has been getting increasingly altered. Pt now is 100% on RA.

## 2019-07-04 NOTE — ED Notes (Signed)
CRITICAL VALUE ALERT  Critical Value:  Lactic acid 4.7  Date & Time Notied:  07/02/2019 1534  Provider Notified: Abigail Butts  Orders Received/Actions taken: n/a

## 2019-07-05 ENCOUNTER — Ambulatory Visit: Payer: Medicare Other | Admitting: Neurology

## 2019-07-05 ENCOUNTER — Encounter (HOSPITAL_COMMUNITY): Payer: Self-pay | Admitting: Internal Medicine

## 2019-07-05 DIAGNOSIS — K922 Gastrointestinal hemorrhage, unspecified: Secondary | ICD-10-CM

## 2019-07-05 DIAGNOSIS — I4891 Unspecified atrial fibrillation: Secondary | ICD-10-CM

## 2019-07-05 DIAGNOSIS — I420 Dilated cardiomyopathy: Secondary | ICD-10-CM

## 2019-07-05 DIAGNOSIS — K529 Noninfective gastroenteritis and colitis, unspecified: Secondary | ICD-10-CM

## 2019-07-05 DIAGNOSIS — K8050 Calculus of bile duct without cholangitis or cholecystitis without obstruction: Secondary | ICD-10-CM

## 2019-07-05 DIAGNOSIS — Z7901 Long term (current) use of anticoagulants: Secondary | ICD-10-CM

## 2019-07-05 DIAGNOSIS — R945 Abnormal results of liver function studies: Secondary | ICD-10-CM

## 2019-07-05 LAB — COMPREHENSIVE METABOLIC PANEL
ALT: 71 U/L — ABNORMAL HIGH (ref 0–44)
AST: 214 U/L — ABNORMAL HIGH (ref 15–41)
Albumin: 3.2 g/dL — ABNORMAL LOW (ref 3.5–5.0)
Alkaline Phosphatase: 341 U/L — ABNORMAL HIGH (ref 38–126)
Anion gap: 18 — ABNORMAL HIGH (ref 5–15)
BUN: 30 mg/dL — ABNORMAL HIGH (ref 8–23)
CO2: 19 mmol/L — ABNORMAL LOW (ref 22–32)
Calcium: 9 mg/dL (ref 8.9–10.3)
Chloride: 104 mmol/L (ref 98–111)
Creatinine, Ser: 0.79 mg/dL (ref 0.44–1.00)
GFR calc Af Amer: >60 mL/min (ref >60)
GFR calc non Af Amer: >60 mL/min (ref >60)
Glucose, Bld: 161 mg/dL — ABNORMAL HIGH (ref 70–99)
Potassium: 4.1 mmol/L (ref 3.5–5.1)
Sodium: 141 mmol/L (ref 135–145)
Total Bilirubin: 1.5 mg/dL — ABNORMAL HIGH (ref 0.3–1.2)
Total Protein: 6.4 g/dL — ABNORMAL LOW (ref 6.5–8.1)

## 2019-07-05 LAB — CBC
HCT: 31.5 % — ABNORMAL LOW (ref 36.0–46.0)
Hemoglobin: 9.1 g/dL — ABNORMAL LOW (ref 12.0–15.0)
MCH: 26.8 pg (ref 26.0–34.0)
MCHC: 28.9 g/dL — ABNORMAL LOW (ref 30.0–36.0)
MCV: 92.9 fL (ref 80.0–100.0)
Platelets: 41 10*3/uL — ABNORMAL LOW (ref 150–400)
RBC: 3.39 MIL/uL — ABNORMAL LOW (ref 3.87–5.11)
RDW: 18.8 % — ABNORMAL HIGH (ref 11.5–15.5)
WBC: 24.1 10*3/uL — ABNORMAL HIGH (ref 4.0–10.5)
nRBC: 0.3 % — ABNORMAL HIGH (ref 0.0–0.2)

## 2019-07-05 LAB — SARS CORONAVIRUS 2 (TAT 6-24 HRS): SARS Coronavirus 2: NEGATIVE

## 2019-07-05 LAB — MRSA PCR SCREENING: MRSA by PCR: NEGATIVE

## 2019-07-05 LAB — LACTIC ACID, PLASMA: Lactic Acid, Venous: 7.2 mmol/L (ref 0.5–1.9)

## 2019-07-05 LAB — SARS CORONAVIRUS 2 BY RT PCR (HOSPITAL ORDER, PERFORMED IN ~~LOC~~ HOSPITAL LAB): SARS Coronavirus 2: NEGATIVE

## 2019-07-05 MED ORDER — CHLORHEXIDINE GLUCONATE CLOTH 2 % EX PADS: 6.0000 | MEDICATED_PAD | Freq: Every day | CUTANEOUS | Status: DC

## 2019-07-05 MED ORDER — METOPROLOL TARTRATE 5 MG/5ML IV SOLN
5.0000 mg | Freq: Once | INTRAVENOUS | Status: AC
  Administered 2019-07-05: 5 mg via INTRAVENOUS
  Filled 2019-07-05: qty 5

## 2019-07-05 MED ORDER — LACTATED RINGERS IV BOLUS
1000.0000 mL | Freq: Once | INTRAVENOUS | Status: AC
  Administered 2019-07-05: 1000 mL via INTRAVENOUS

## 2019-07-05 MED ORDER — FENTANYL CITRATE (PF) 100 MCG/2ML IJ SOLN: 25.0000 ug | INTRAMUSCULAR | Status: DC | PRN

## 2019-07-05 MED ORDER — SODIUM CHLORIDE 0.9 % IV BOLUS
500.0000 mL | Freq: Once | INTRAVENOUS | Status: AC
  Administered 2019-07-05: 500 mL via INTRAVENOUS

## 2019-07-05 MED ORDER — FENTANYL CITRATE (PF) 100 MCG/2ML IJ SOLN
25.0000 ug | Freq: Once | INTRAMUSCULAR | Status: AC
  Administered 2019-07-05: 25 ug via INTRAVENOUS
  Filled 2019-07-05: qty 2

## 2019-07-05 MED ORDER — ALUM & MAG HYDROXIDE-SIMETH 200-200-20 MG/5ML PO SUSP
30.0000 mL | ORAL | Status: DC | PRN
  Administered 2019-07-05: 30 mL via ORAL
  Filled 2019-07-05: qty 30

## 2019-07-06 LAB — URINE CULTURE: Culture: NO GROWTH

## 2019-07-09 LAB — CULTURE, BLOOD (ROUTINE X 2)
Culture: NO GROWTH
Culture: NO GROWTH
Special Requests: ADEQUATE

## 2019-07-16 NOTE — Progress Notes (Addendum)
[facility] night shift.  I spoke to the patient's daughter, Jamey Ripa, about her worsening clinical condition. She has sepsis with high lactic acid and newly diagnosed metastatic lesions on her liver.  She is stated under the circumstances, probably her mother would not like to prolong suffering if she does not have a good prognosis.She is currently full care, but has been made DNR/DNI. I will order fentanyl Q1 hourly PRN  Tennis Must, MD

## 2019-07-16 NOTE — Progress Notes (Signed)
CRITICAL VALUE ALERT  Critical Value:  Lactic 7.2  Date & Time Notied:  07-29-2019  Provider Notified: Dr. Olevia Bowens  Orders Received/Actions taken: Waiting for orders/call back

## 2019-07-16 NOTE — Consult Note (Signed)
Referring Provider: Barton Dubois, MD Primary Care Physician:  Glenis Smoker, MD Primary Gastroenterologist:  Barney Drain, MD  Reason for Consultation:  Stercoral colitis  HPI: Angelica Hernandez is a 86 y.o. female with multiple comorbidities including CAD, pacemaker, Afib (on Eliquis), dilated cardiomyopathy, CHF, h/o diverticular bleed October 2020, obstructive jaundice s/p ERCP with metal wall biliary stent and pancreatic duct stent for choledocholithiasis and subsequent cholecystectomy presenting via EMS for several day history of abdominal pain, confusion.   All history obtained from chart: At time of my evaluation, patient was unresponsive to painful stimuli. No corneal or gag reflex. Attending and family notified of significant decline. She had been made DNR earlier this morning and received one dose of Fentanyl 25mg  due to significant pain. Patient passed shortly after evaluation. Dr. Dyann Kief at bedside.    Per daughter patient had been lying in bed for about 3 weeks, increased confusion, progressive weakness, constipation.  Had a fall a couple of weeks ago has been complaining of neck and back pain.  Yesterday was trying to have a bowel movement, utilize finger to assist.  Daughter not aware of how long it has been since a BM.  Stools reported to be black.  Complained of abdominal pain for 2 days and due to worsening symptoms, presented to ED.   Evaluation this admission via CT abdomen pelvis yesterday showed very large amount of stool in the distal sigmoid colon and rectum, consistent with fecal impaction.  Innumerable heterogeneous low-attenuation lesions in the liver, new from October 31 study, concerning for metastatic disease.  Common bile duct stent noted.  No mention of pancreatic duct stent. New 1 cm area of low-attenuation within the lower pole of the left kidney,?  Cyst, correlate with renal ultrasound.  Chest CT without contrast yesterday being performed because of possible  metastasis to the liver, no evidence of malignancy in the thorax.  She is heme positive in the ED.  Stool black on digital rectal exam.  Lactic acid initially 4.7, this morning up to 7.2.  Total bilirubin 1.6, alkaline phosphatase 377, AST 104, ALT 54, BUN 24, creatinine 0.73, white blood cell count 12,100, hemoglobin 9.4, new thrombocytopenia 54,000.  INR 1.7.  Lipase 91.  Blood cultures negative thus far.  Covid testing negative.  Today her white count is up to 24,100.  Hemoglobin stable at 9.1.  Platelets 41,000.  BUN up at 30.  Total bilirubin 1.5, alkaline phosphatase 341, AST doubled at 214, ALT 71.  Pertinent GI history  EGD and colonoscopy during admission October 2020 for GI bleeding, she was found to have H. pylori gastritis which has been treated.  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  On colonoscopy she had diverticulosis which was felt to be the source of her rectal bleeding.  She had EUS/ERCP 04/13/20 for obstructive jaundice, dilated biliary tree and pancreatic duct.  EGD portion showed gastritis, 1 nonbleeding cratered duodenal ulcer with clean ulcer base in the duodenal bulb, 8 mm in size, prominent/floppy ampulla.  H. pylori negative.  EUS did not show mass or lesion within pancreas. She had significant sludge and debris and a dilated biliary tree.  Sphincterotomy was performed and subsequently sphincteroplasty.  Upon balloon pull-through on her second pull-through sludge and debris came out but it was thought that there was a potential perforation.  Self-expanding metal stent was placed.  Follow-up CT scan and barium upper GI series showed no evidence of leak or perforation.  Obstructive jaundice improved.  She underwent cholecystectomy  05/21/2019.  Postoperatively her LFTs were near normal on 05/22/2019.  AST remained elevated at 57 but much improved.  Scheduled for repeat ERCP next month.     Prior to Admission medications   Medication Sig Start Date End Date Taking? Authorizing  Provider  apixaban (ELIQUIS) 2.5 MG TABS tablet Take 1 tablet (2.5 mg total) by mouth 2 (two) times daily. 05/02/19 10/29/19 Yes Herminio Commons, MD  atorvastatin (LIPITOR) 10 MG tablet Take 10 mg by mouth daily.   Yes [provider]  DULoxetine (CYMBALTA) 30 MG capsule Take 30 mg by mouth daily.  03/01/19  Yes [provider]  FERREX 150 150 MG capsule Take 150 mg by mouth daily.  02/07/18  Yes [provider]  gabapentin (NEURONTIN) 100 MG capsule Take 100 mg by mouth daily as needed. 05/24/19  Yes [provider]  lansoprazole (PREVACID) 30 MG capsule Take 30 mg by mouth 2 (two) times daily before a meal.   Yes [provider]  levothyroxine (SYNTHROID, LEVOTHROID) 50 MCG tablet Take 50 mcg by mouth daily before breakfast.   Yes [provider]  metoprolol succinate (TOPROL-XL) 100 MG 24 hr tablet Take 100 mg by mouth 2 (two) times daily. 04/18/19  Yes [provider]  nitroGLYCERIN (NITROSTAT) 0.4 MG SL tablet Place 1 tablet (0.4 mg total) under the tongue every 5 (five) minutes as needed for chest pain. 09/18/18  Yes Herminio Commons, MD  pantoprazole (PROTONIX) 40 MG tablet Take 1 tablet (40 mg total) by mouth 2 (two) times daily. Patient taking differently: Take 40 mg by mouth daily.  05/01/19 07/08/2019 Yes Mansouraty, Telford Nab., MD  Polyethyl Glycol-Propyl Glycol (SYSTANE OP) Apply 1 drop to eye 3 (three) times daily as needed (dry eyes).   Yes [provider]    Current Facility-Administered Medications  Medication Dose Route Frequency Provider Last Rate Last Admin  . 0.9 %  sodium chloride infusion   Intravenous Continuous Elgergawy, Silver Huguenin, MD 75 mL/hr at 07/01/2019 2202 New Bag at 06/22/2019 2202  . acetaminophen (TYLENOL) tablet 650 mg  650 mg Oral Q6H PRN Elgergawy, Silver Huguenin, MD       Or  . acetaminophen (TYLENOL) suppository 650 mg  650 mg Rectal Q6H PRN Elgergawy, Silver Huguenin, MD      . alum & mag  hydroxide-simeth (MAALOX/MYLANTA) 200-200-20 MG/5ML suspension 30 mL  30 mL Oral Q4H PRN Elgergawy, Silver Huguenin, MD   30 mL at 07/22/2019 0455  . amiodarone (NEXTERONE PREMIX) 360-4.14 MG/200ML-% (1.8 mg/mL) IV infusion  30 mg/hr Intravenous Continuous Elgergawy, Silver Huguenin, MD 16.67 mL/hr at 07-22-2019 0500 30 mg/hr at Jul 22, 2019 0500  . bisacodyl (DULCOLAX) suppository 10 mg  10 mg Rectal BID Elgergawy, Silver Huguenin, MD   10 mg at 06/21/2019 2132  . cefTRIAXone (ROCEPHIN) 2 g in sodium chloride 0.9 % 100 mL IVPB  2 g Intravenous Q24H Elgergawy, Silver Huguenin, MD   Stopped at 22-Jul-2019 0443  . Chlorhexidine Gluconate Cloth 2 % PADS 6 each  6 each Topical Daily Reubin Milan, MD      . fentaNYL (SUBLIMAZE) injection 25 mcg  25 mcg Intravenous Q1H PRN Reubin Milan, MD      . levothyroxine (SYNTHROID) tablet 50 mcg  50 mcg Oral QAC breakfast Elgergawy, Silver Huguenin, MD      . metroNIDAZOLE (FLAGYL) IVPB 500 mg  500 mg Intravenous Q8H Elgergawy, Silver Huguenin, MD 100 mL/hr at 2019/07/22 0549 500 mg at 07/22/19 0549  .  ondansetron (ZOFRAN) tablet 4 mg  4 mg Oral Q6H PRN Elgergawy, Silver Huguenin, MD       Or  . ondansetron (ZOFRAN) injection 4 mg  4 mg Intravenous Q6H PRN Elgergawy, Silver Huguenin, MD      . sodium phosphate (FLEET) 7-19 GM/118ML enema 1 enema  1 enema Rectal BID Elgergawy, Silver Huguenin, MD   1 enema at 06/19/2019 2142    Allergies as of 07/08/2019  . (No Known Allergies)    Past Medical History:  Diagnosis Date  . Anxiety   . CAD (coronary artery disease)   . CKD (chronic kidney disease)   . Dysrhythmia   . Gastro-esophageal reflux disease without esophagitis   . Hypothyroidism   . Ischemic cardiomyopathy   . Jaundice 03/2019  . Mild dilation of ascending aorta (HCC)   . Moderate protein-calorie malnutrition ([place])   . Permanent atrial fibrillation ([place])   . Presence of permanent cardiac pacemaker   . Severe episode of recurrent major depressive disorder, without psychotic features ([place])   . Symptomatic  bradycardia     Past Surgical History:  Procedure Laterality Date  . BILIARY DILATION  04/14/2019   Procedure: BILIARY DILATION;  Surgeon: Rush Landmark Telford Nab., MD;  Location: [place];  Service: Gastroenterology;;  . BILIARY STENT PLACEMENT  04/14/2019   Procedure: BILIARY STENT PLACEMENT;  Surgeon: Irving Copas., MD;  Location: [place];  Service: Gastroenterology;;  . BIOPSY  04/14/2019   Procedure: BIOPSY;  Surgeon: Irving Copas., MD;  Location: [place];  Service: Gastroenterology;;  . CESAREAN SECTION    . CHOLECYSTECTOMY N/A 05/21/2019   Procedure: LAPAROSCOPIC CHOLECYSTECTOMY;  Surgeon: Rolm Bookbinder, MD;  Location: [place];  Service: General;  Laterality: N/A;  . COLONOSCOPY N/A 03/26/2019   Dr. Gala Romney: Diverticulosis in the sigmoid and descending colon, suspected diverticular bleed in the setting of hypercoagulability.  . CORONARY ARTERY BYPASS GRAFT  1970   1970s when living in [place]  . ERCP N/A 04/14/2019   Procedure: ENDOSCOPIC RETROGRADE CHOLANGIOPANCREATOGRAPHY (ERCP);  Surgeon: Irving Copas., MD;  Location: [place];  Service: Gastroenterology;  Laterality: N/A;  . ESOPHAGOGASTRODUODENOSCOPY (EGD) WITH PROPOFOL N/A 03/26/2019   Dr. Gala Romney: Small hiatal hernia, nonobstructing and mild Schatzki ring, abnormal gastric mucosa with biopsies positive for H. pylori with subsequent treatment.  Duodenal AVMs ablated.  . ESOPHAGOGASTRODUODENOSCOPY (EGD) WITH PROPOFOL N/A 04/14/2019   Procedure: ESOPHAGOGASTRODUODENOSCOPY (EGD) WITH PROPOFOL;  Surgeon: Rush Landmark Telford Nab., MD;  Location: [place];  Service: Gastroenterology;  Laterality: N/A;  . EUS N/A 04/14/2019   Procedure: UPPER ENDOSCOPIC ULTRASOUND (EUS) LINEAR;  Surgeon: Irving Copas., MD;  Location: [place];  Service: Gastroenterology;  Laterality: N/A;  . HEMOSTASIS CLIP PLACEMENT  04/14/2019   Procedure: HEMOSTASIS CLIP PLACEMENT;  Surgeon:  Irving Copas., MD;  Location: [place];  Service: Gastroenterology;;  . OTHER SURGICAL HISTORY     cesearan section   . PACEMAKER GENERATOR CHANGE  06/15/2017   [employer] Accolade SR L 300 ([place] [id number]) implanted by Dr Ernestina Patches at [facility] in [place] for symptomatic bradycardia  . PACEMAKER IMPLANT  11/16/2006   PPM implanted for bradycardia  . PANCREATIC STENT PLACEMENT  04/14/2019   Procedure: PANCREATIC STENT PLACEMENT;  Surgeon: Rush Landmark Telford Nab., MD;  Location: [place];  Service: Gastroenterology;;  . REMOVAL OF STONES  04/14/2019   Procedure: REMOVAL OF STONES;  Surgeon: Irving Copas., MD;  Location: [place];  Service: Gastroenterology;;  . Joan Mayans  04/14/2019  Procedure: SPHINCTEROTOMY;  Surgeon: Mansouraty, Telford Nab., MD;  Location[facility] ENDOSCOPY;  Service: Gastroenterology;;    Family History  Problem Relation Age of Onset  . Heart disease Mother   . Hypertension Mother   . Diabetes Mother   . Hypertension Father   . Heart disease Father   . Hypertension Brother   . Colon cancer Neg Hx   . Esophageal cancer Neg Hx   . Inflammatory bowel disease Neg Hx   . Pancreatic cancer Neg Hx   . Stomach cancer Neg Hx   . Rectal cancer Neg Hx     Social History   Socioeconomic History  . Marital status: Widowed    Spouse name: Not on file  . Number of children: Not on file  . Years of education: Not on file  . Highest education level: Not on file  Occupational History  . Not on file  Tobacco Use  . Smoking status: Current Every Day Smoker    Packs/day: 0.50    Years: 72.00    Pack years: 36.00    Types: Cigarettes  . Smokeless tobacco: Never Used  Substance and Sexual Activity  . Alcohol use: Never  . Drug use: Never  . Sexual activity: Not on file  Other Topics Concern  . Not on file  Social History Narrative   Lives with daughter in [place] Determinants of Health    Financial Resource Strain:   . Difficulty of Paying Living Expenses: Not on file  Food Insecurity:   . Worried About Charity fundraiser in the Last Year: Not on file  . Ran Out of Food in the Last Year: Not on file  Transportation Needs:   . Lack of Transportation (Medical): Not on file  . Lack of Transportation (Non-Medical): Not on file  Physical Activity:   . Days of Exercise per Week: Not on file  . Minutes of Exercise per Session: Not on file  Stress:   . Feeling of Stress : Not on file  Social Connections:   . Frequency of Communication with Friends and Family: Not on file  . Frequency of Social Gatherings with Friends and Family: Not on file  . Attends Religious Services: Not on file  . Active Member of Clubs or Organizations: Not on file  . Attends Archivist Meetings: Not on file  . Marital Status: Not on file  Intimate Partner Violence:   . Fear of Current or Ex-Partner: Not on file  . Emotionally Abused: Not on file  . Physically Abused: Not on file  . Sexually Abused: Not on file     ROS: unavailable        Physical Examination: Vital signs in last 24 hours: Temp:  [95.8 F (35.4 C)-97.2 F (36.2 C)] 95.8 F (35.4 C) (01/21 0746) Pulse Rate:  [60-118] 60 (01/21 0640) Resp:  [15-26] 15 (01/21 0849) BP: (42-123)/(19-95) 42/33 (01/21 0849) SpO2:  [20 %-98 %] 91 % (01/21 0718) Weight:  [50.2 kg-52.8 kg] 50.2 kg (01/21 0300) Last BM Date: 06/28/19  General: cachetic, pale, unresponsive to verbal or painful stimuli Head: Normocephalic, atraumatic.   Eyes: pupil fixed Mouth: dry  Lungs: faint breath sounds Heart: bradycardia, pulse 40-50s Abdomen: hypoactive bowel sounds, abd flat.   Rectal: incontinent of dark brown stool Extremities: cool  Neuro: unresponsive Skin: cool.   Psych: could not access.        Intake/Output from previous day: 01/20 0701 - 01/21 0700 In: 1091.8 [I.V.:591.8;  IV Piggyback:500] Out: -  Intake/Output this  shift: No intake/output data recorded.  Lab Results: CBC Recent Labs    07/02/2019 1457 17-Jul-2019 0357  WBC 12.1* 24.1*  HGB 9.4* 9.1*  HCT 30.2* 31.5*  MCV 87.8 92.9  PLT 54* 41*   BMET Recent Labs    07/08/2019 1457 Jul 17, 2019 0357  NA 137 141  K 3.8 4.1  CL 99 104  CO2 25 19*  GLUCOSE 183* 161*  BUN 24* 30*  CREATININE 0.73 0.79  CALCIUM 9.0 9.0   LFT Recent Labs    06/23/2019 1457 17-Jul-2019 0357  BILITOT 1.6* 1.5*  ALKPHOS 377* 341*  AST 104* 214*  ALT 54* 71*  PROT 6.9 6.4*  ALBUMIN 3.7 3.2*    Lipase Recent Labs    07/10/2019 1457  LIPASE 91*    PT/INR Recent Labs    06/21/2019 1457  LABPROT 19.5*  INR 1.7*      Imaging Studies: DG Thoracic Spine 2 View  Result Date: 06/26/2019 CLINICAL DATA:  Ecchymosis. Additional history provided by technologist: EXAM: THORACIC SPINE 2 VIEWS COMPARISON:  Chest radiograph 06/15/2019 FINDINGS: Partially visualized single lead AICD device. Heart size within normal limits. Aortic atherosclerosis. Prior median sternotomy. Cardiac monitoring leads. The bones are diffusely demineralized. Exaggerated thoracic kyphosis. No significant spondylolisthesis. No thoracic compression deformity is identified. Mild multilevel disc height loss with endplate spurring. IMPRESSION: No thoracic compression deformity is identified. Thoracic spondylosis. Diffuse osseous demineralization. Electronically Signed   By: Kellie Simmering DO   On: 07/02/2019 17:45   DG Lumbar Spine Complete  Result Date: 06/30/2019 CLINICAL DATA:  Ecchymosis EXAM: LUMBAR SPINE - COMPLETE 4+ VIEW COMPARISON:  CT 07/15/2019, 04/14/2019 FINDINGS: Biliary and probable pancreatic duct stent. Excreted contrast in the kidneys. Chronic superior endplate deformity at L1. Alignment normal. Remaining vertebral body heights are normal. Mild degenerative osteophytes. IMPRESSION: Chronic superior endplate deformity at L1. No acute osseous abnormality. Electronically Signed   By: Donavan Foil M.D.   On: 06/30/2019 17:52   CT Head Wo Contrast  Result Date: 06/20/2019 CLINICAL DATA:  Status post trauma. EXAM: CT HEAD WITHOUT CONTRAST TECHNIQUE: Contiguous axial images were obtained from the base of the skull through the vertex without intravenous contrast. COMPARISON:  June 25, 2019 FINDINGS: Brain: There is moderate severity cerebral atrophy with widening of the extra-axial spaces and ventricular dilatation. There are areas of decreased attenuation within the white matter tracts of the supratentorial brain, consistent with microvascular disease changes. A small area of cortical encephalomalacia, with adjacent chronic white matter low attenuation is seen within the left occipital lobe. Vascular: No hyperdense vessel or unexpected calcification. Skull: Normal. Negative for fracture or focal lesion. Sinuses/Orbits: No acute finding. Other: None. IMPRESSION: 1. Generalized cerebral atrophy. 2. Small chronic left occipital lobe infarct. 3. No acute intracranial abnormality. Electronically Signed   By: Virgina Norfolk M.D.   On: 06/22/2019 17:47   CT CHEST WO CONTRAST  Result Date: 07/04/2019 CLINICAL DATA:  Liver lesions suspicious for metastatic disease on abdominal CT earlier today. EXAM: CT CHEST WITHOUT CONTRAST TECHNIQUE: Multidetector CT imaging of the chest was performed following the standard protocol without IV contrast. COMPARISON:  Chest radiograph earlier this day. No prior chest CT. FINDINGS: Cardiovascular: Single lead left-sided pacemaker in place. Atherosclerosis of the thoracic aorta. No aortic aneurysm. Post median sternotomy and CABG. Calcification of native coronary arteries. Borderline cardiomegaly. No pericardial effusion. Mediastinum/Nodes: No enlarged mediastinal lymph nodes, largest node measures 9 mm short axis in the left  lower paratracheal station. 7 mm right upper paraesophageal node above the thoracic inlet, series 2, image 14. Limited assessment for hilar  adenopathy in the absence of IV contrast. No axillary adenopathy. No esophageal wall thickening. No visualized thyroid nodule. Lungs/Pleura: Small bilateral pleural effusions. Moderate central bronchial thickening. Mild emphysema. Mild smooth septal thickening at the apices may represent pulmonary edema. No dominant pulmonary mass. Ill-defined ground-glass opacities in the left upper lobe appear peribronchovascular. Tiny pleural-based left upper lobe nodules series 4, image 26 measuring 3 mm and series 4, image 52. Tiny subpleural nodule in the anterior right upper lobe series 4, image 55, tiny subpleural nodule in the right middle lobe series 4, image 67. Minor linear atelectasis in the lingula. Upper Abdomen: Assessed on contrast-enhanced abdominal CT earlier this day. Liver lesions again seen, less well-defined in the absence of IV contrast. Musculoskeletal: Mild L1 superior endplate compression fracture. Degenerative change in the thoracic spine. Median sternotomy wires. No blastic or evidence of destructive lytic lesion. No suspicious CT breast findings. IMPRESSION: 1. No specific CT findings of malignancy in the thorax. No pulmonary mass or suspicious adenopathy. There are tiny pleural and subpleural pulmonary nodules measuring up to 3 mm, that are nonspecific, suspect incidental. 2. Mild emphysema. Moderate bronchial thickening. Mild ill-defined ground-glass opacities in the left upper lobe slightly peribronchovascular, likely infectious or inflammatory. 3. Mild smooth septal thickening at the apices, nonspecific but can be seen with pulmonary edema. 4. Liver lesions again seen, assessed on abdominal CT earlier this day. 5. Mild L1 superior endplate compression fracture, age indeterminate but likely chronic. Aortic Atherosclerosis (ICD10-I70.0) and Emphysema (ICD10-J43.9). Electronically Signed   By: Keith Rake M.D.   On: 07/01/2019 23:20   CT Cervical Spine Wo Contrast  Result Date:  06/27/2019 CLINICAL DATA:  Status post trauma. EXAM: CT CERVICAL SPINE WITHOUT CONTRAST TECHNIQUE: Multidetector CT imaging of the cervical spine was performed without intravenous contrast. Multiplanar CT image reconstructions were also generated. COMPARISON:  None. FINDINGS: Alignment: Normal. Skull base and vertebrae: No acute fracture. No primary bone lesion or focal pathologic process. Soft tissues and spinal canal: No prevertebral fluid or swelling. No visible canal hematoma. Disc levels: C2-3: Normal endplates. Mild disc space narrowing is seen. Bilateral facet hypertrophy is noted. Normal central canal and intervertebral neuroforamina. C3-4: Normal endplates. Mild disc space narrowing is seen. Bilateral facet hypertrophy is noted. Normal central canal and intervertebral neuroforamina. C4-5: Normal endplates. Mild disc space narrowing is seen. Bilateral facet hypertrophy is noted. Normal central canal and intervertebral neuroforamina. C5-6: Mild end plate spondylosis. Moderate severity disc space narrowing is seen. Bilateral facet hypertrophy is noted. Normal central canal and intervertebral neuroforamina. C6-7: There is mild end plate spondylosis. Moderate severity disc space narrowing is seen. Bilateral facet hypertrophy is noted. Normal central canal and intervertebral neuroforamina. C7-T1: Normal endplates. Mild to moderate severity disc space narrowing is seen. Bilateral facet hypertrophy is noted. Normal central canal and intervertebral neuroforamina. Upper chest: Mild biapical scarring is seen with mild areas of biapical parenchymal calcification. Other: None. IMPRESSION: 1. Multilevel degenerative disc disease and facet hypertrophy, without central canal or foraminal narrowing. 2. No acute osseous abnormality. Electronically Signed   By: Virgina Norfolk M.D.   On: 06/20/2019 17:51   CT ABDOMEN PELVIS W CONTRAST  Result Date: 06/22/2019 CLINICAL DATA:  Abdominal pain. EXAM: CT ABDOMEN AND PELVIS  WITH CONTRAST TECHNIQUE: Multidetector CT imaging of the abdomen and pelvis was performed using the standard protocol following bolus administration of intravenous contrast. CONTRAST:  158mL OMNIPAQUE IOHEXOL 300 MG/ML  SOLN COMPARISON:  April 14, 2019 FINDINGS: Lower chest: Multiple sternal wires are noted. Very mild atelectasis is seen along the posterior aspect of the right lung base. There is a small right pleural effusion. Hepatobiliary: Innumerable heterogeneous low-attenuation liver lesions are seen. Status post cholecystectomy. A common bile duct stent is in place. Pancreas: There is mild dilatation of the pancreatic duct (6 mm). The remainder of the pancreas is unremarkable. Spleen: The spleen is small in size and slightly heterogeneous in appearance. Adrenals/Urinary Tract: Adrenal glands are unremarkable. Kidneys are normal, without renal calculi or hydronephrosis. A stable 1.2 cm simple cyst is seen within the mid right kidney. A 1.0 cm area of low attenuation is seen within the lower pole of the left kidney. This represents a new finding compared to the prior exam. Bladder is unremarkable. Stomach/Bowel: Stomach is within normal limits. The appendix is not identified. No evidence of bowel wall thickening, distention, or inflammatory changes. A very large amount of stool is seen within the distal sigmoid colon and rectum. Vascular/Lymphatic: Marked severity aortic atherosclerosis. No enlarged abdominal or pelvic lymph nodes. Reproductive: Status post hysterectomy. No adnexal masses. Other: No abdominal wall hernia or abnormality. No abdominopelvic ascites. Musculoskeletal: Multilevel degenerative changes seen throughout the lumbar spine. IMPRESSION: 1. Very large amount of stool in the distal sigmoid colon and rectum, consistent with fecal impaction. 2. Innumerable heterogeneous low-attenuation lesions in the liver. This represents a new finding when compared to the prior study dated April 14, 2019  and is concerning for metastatic disease. 3. Small right pleural effusion. 4. Evidence of prior cholecystectomy. 5. Common bile duct stent. 6. New 1.0 cm area of low attenuation within the lower pole of the left kidney. While this may represent a small renal cyst, correlation with renal ultrasound is recommended to exclude the presence of an associated soft tissue component. Aortic Atherosclerosis (ICD10-I70.0). Electronically Signed   By: Virgina Norfolk M.D.   On: 06/18/2019 17:37   DG Chest Port 1 View  Result Date: 07/08/2019 CLINICAL DATA:  Altered mental status today. EXAM: PORTABLE CHEST 1 VIEW COMPARISON:  Single-view of the chest 04/14/2019. FINDINGS: Pacing device remains in place. The patient is status post CABG. Lungs are clear. Heart size is upper normal. No pneumothorax or pleural fluid. No acute or focal bony abnormality. IMPRESSION: No acute disease. Electronically Signed   By: Inge Rise M.D.   On: 07/01/2019 14:23   DG Foot Complete Right  Result Date: 06/20/2019 CLINICAL DATA:  Ecchymosis EXAM: RIGHT FOOT COMPLETE - 3+ VIEW COMPARISON:  None. FINDINGS: No acute displaced fracture or malalignment. Soft tissues are unremarkable. Clips adjacent to the distal tibia IMPRESSION: No acute osseous abnormality Electronically Signed   By: Donavan Foil M.D.   On: 07/03/2019 17:48  [4 week]   Impression/Plan: 86 year old female with multiple comorbidities as outlined above, presenting with progressive abdominal pain, worsening confusion, black heme positive stools.  On admission CT showed large amount of stool present, new innumerable hepatic lesions concerning for metastatic disease.  Initially lactic acid 4.7, declined overnight to 7.2.  Mild increase in LFTs.  Hemoglobin stable at 9.1.  New thrombocytopenia this admission with platelets low at 41,000.  Earlier this morning, patient was having significant moaning.  Attending discussed with family regarding declining condition, patient  was made DNR/DNI.  At time of my evaluation she had become unresponsive as outlined, declining blood pressure and pulse and subsequently passed shortly after evaluation.  Attending and  family aware.  We would like to thank you for the opportunity to participate in the care of Angelica Hernandez.  Laureen Ochs. Bernarda Caffey [facility] [phone] Feb 14, 20219:18 AM    LOS: 1 day

## 2019-07-16 NOTE — Death Summary Note (Signed)
DEATH SUMMARY   Patient Details  Name: Angelica Hernandez MRN: [id number] DOB: 09-18-1933  Admission/Discharge Information   Admit Date:  2019-07-28  Date of Death:  Jul 29, 2019  Time of Death:  9:15 AM  Length of Stay: 1  Referring Physician: Glenis Smoker, MD   Reason(s) for Hospitalization  Abdominal pain.  Patient with history of coronary artery disease, atrial fibrillation on chronic anticoagulation, dilated cardiomyopathy/combined heart failure, biliary stenosis and choledocholithiasis status post cholecystectomy in December 2020.  Who has been weaker, confused and complaining of intermittent abdominal pain for the last 2 weeks.  For the last 2 days prior to admission.  Patient was found with acute sepsis in the setting of stercoral colitis, A. fib with RVR, dehydration and abnormal liver attenuation findings on CT abdomen suspected for metastasis.  Diagnoses  Preliminary cause of death:  Secondary Diagnoses (including complications and co-morbidities):  Active Problems:   Cardiac pacemaker in situ   Atrial fibrillation (HCC)   Acute anemia   CAD (coronary artery disease)   Abnormal LFTs   DCM (dilated cardiomyopathy) (HCC)   Hypotension   Choledocholithiasis   History of biliary duct stent placement   Chronic combined systolic and diastolic heart failure ([place])   Sepsis [facility])   Brief Hospital Course (including significant findings, care, treatment, and services provided and events leading to death)  Angelica Hernandez is a 86 y.o. year old female who was admitted secondary to sepsis due to stercoral colitis, A. fib with RVR, dehydration and with positive fecal occult blood test.  Patient also had abnormal CT abdomen findings for liver attenuation changes suspected to be secondary to metastasis.  Despite fluid resuscitation, amiodarone drip, IV broad-spectrum antibiotics (using cefepime) and supportive care she continue declining/deteriorating.  Also care discussion by Dr.  Olevia Bowens (night coverage) and patient's daughter resulted in decision to focus on comfort care and make patient DNR/DNI.  Around 9:15 in the morning patient's ended passing away.    Pertinent Labs and Studies  Significant Diagnostic Studies DG Thoracic Spine 2 View  Result Date: 07/28/19 CLINICAL DATA:  Ecchymosis. Additional history provided by technologist: EXAM: THORACIC SPINE 2 VIEWS COMPARISON:  Chest radiograph 07/28/19 FINDINGS: Partially visualized single lead AICD device. Heart size within normal limits. Aortic atherosclerosis. Prior median sternotomy. Cardiac monitoring leads. The bones are diffusely demineralized. Exaggerated thoracic kyphosis. No significant spondylolisthesis. No thoracic compression deformity is identified. Mild multilevel disc height loss with endplate spurring. IMPRESSION: No thoracic compression deformity is identified. Thoracic spondylosis. Diffuse osseous demineralization. Electronically Signed   By: Kellie Simmering DO   On: July 28, 2019 17:45   DG Lumbar Spine Complete  Result Date: 07-28-2019 CLINICAL DATA:  Ecchymosis EXAM: LUMBAR SPINE - COMPLETE 4+ VIEW COMPARISON:  CT 07-28-2019, 04/14/2019 FINDINGS: Biliary and probable pancreatic duct stent. Excreted contrast in the kidneys. Chronic superior endplate deformity at L1. Alignment normal. Remaining vertebral body heights are normal. Mild degenerative osteophytes. IMPRESSION: Chronic superior endplate deformity at L1. No acute osseous abnormality. Electronically Signed   By: Donavan Foil M.D.   On: Jul 28, 2019 17:52   CT Head Wo Contrast  Result Date: 07-28-2019 CLINICAL DATA:  Status post trauma. EXAM: CT HEAD WITHOUT CONTRAST TECHNIQUE: Contiguous axial images were obtained from the base of the skull through the vertex without intravenous contrast. COMPARISON:  June 25, 2019 FINDINGS: Brain: There is moderate severity cerebral atrophy with widening of the extra-axial spaces and ventricular dilatation. There are  areas of decreased attenuation within the white matter tracts of the supratentorial  brain, consistent with microvascular disease changes. A small area of cortical encephalomalacia, with adjacent chronic white matter low attenuation is seen within the left occipital lobe. Vascular: No hyperdense vessel or unexpected calcification. Skull: Normal. Negative for fracture or focal lesion. Sinuses/Orbits: No acute finding. Other: None. IMPRESSION: 1. Generalized cerebral atrophy. 2. Small chronic left occipital lobe infarct. 3. No acute intracranial abnormality. Electronically Signed   By: Virgina Norfolk M.D.   On: 06/17/2019 17:47   CT CHEST WO CONTRAST  Result Date: 06/16/2019 CLINICAL DATA:  Liver lesions suspicious for metastatic disease on abdominal CT earlier today. EXAM: CT CHEST WITHOUT CONTRAST TECHNIQUE: Multidetector CT imaging of the chest was performed following the standard protocol without IV contrast. COMPARISON:  Chest radiograph earlier this day. No prior chest CT. FINDINGS: Cardiovascular: Single lead left-sided pacemaker in place. Atherosclerosis of the thoracic aorta. No aortic aneurysm. Post median sternotomy and CABG. Calcification of native coronary arteries. Borderline cardiomegaly. No pericardial effusion. Mediastinum/Nodes: No enlarged mediastinal lymph nodes, largest node measures 9 mm short axis in the left lower paratracheal station. 7 mm right upper paraesophageal node above the thoracic inlet, series 2, image 14. Limited assessment for hilar adenopathy in the absence of IV contrast. No axillary adenopathy. No esophageal wall thickening. No visualized thyroid nodule. Lungs/Pleura: Small bilateral pleural effusions. Moderate central bronchial thickening. Mild emphysema. Mild smooth septal thickening at the apices may represent pulmonary edema. No dominant pulmonary mass. Ill-defined ground-glass opacities in the left upper lobe appear peribronchovascular. Tiny pleural-based left upper  lobe nodules series 4, image 26 measuring 3 mm and series 4, image 52. Tiny subpleural nodule in the anterior right upper lobe series 4, image 55, tiny subpleural nodule in the right middle lobe series 4, image 67. Minor linear atelectasis in the lingula. Upper Abdomen: Assessed on contrast-enhanced abdominal CT earlier this day. Liver lesions again seen, less well-defined in the absence of IV contrast. Musculoskeletal: Mild L1 superior endplate compression fracture. Degenerative change in the thoracic spine. Median sternotomy wires. No blastic or evidence of destructive lytic lesion. No suspicious CT breast findings. IMPRESSION: 1. No specific CT findings of malignancy in the thorax. No pulmonary mass or suspicious adenopathy. There are tiny pleural and subpleural pulmonary nodules measuring up to 3 mm, that are nonspecific, suspect incidental. 2. Mild emphysema. Moderate bronchial thickening. Mild ill-defined ground-glass opacities in the left upper lobe slightly peribronchovascular, likely infectious or inflammatory. 3. Mild smooth septal thickening at the apices, nonspecific but can be seen with pulmonary edema. 4. Liver lesions again seen, assessed on abdominal CT earlier this day. 5. Mild L1 superior endplate compression fracture, age indeterminate but likely chronic. Aortic Atherosclerosis (ICD10-I70.0) and Emphysema (ICD10-J43.9). Electronically Signed   By: Keith Rake M.D.   On: 06/30/2019 23:20   CT Cervical Spine Wo Contrast  Result Date: 07/03/2019 CLINICAL DATA:  Status post trauma. EXAM: CT CERVICAL SPINE WITHOUT CONTRAST TECHNIQUE: Multidetector CT imaging of the cervical spine was performed without intravenous contrast. Multiplanar CT image reconstructions were also generated. COMPARISON:  None. FINDINGS: Alignment: Normal. Skull base and vertebrae: No acute fracture. No primary bone lesion or focal pathologic process. Soft tissues and spinal canal: No prevertebral fluid or swelling. No  visible canal hematoma. Disc levels: C2-3: Normal endplates. Mild disc space narrowing is seen. Bilateral facet hypertrophy is noted. Normal central canal and intervertebral neuroforamina. C3-4: Normal endplates. Mild disc space narrowing is seen. Bilateral facet hypertrophy is noted. Normal central canal and intervertebral neuroforamina. C4-5: Normal endplates. Mild disc space  narrowing is seen. Bilateral facet hypertrophy is noted. Normal central canal and intervertebral neuroforamina. C5-6: Mild end plate spondylosis. Moderate severity disc space narrowing is seen. Bilateral facet hypertrophy is noted. Normal central canal and intervertebral neuroforamina. C6-7: There is mild end plate spondylosis. Moderate severity disc space narrowing is seen. Bilateral facet hypertrophy is noted. Normal central canal and intervertebral neuroforamina. C7-T1: Normal endplates. Mild to moderate severity disc space narrowing is seen. Bilateral facet hypertrophy is noted. Normal central canal and intervertebral neuroforamina. Upper chest: Mild biapical scarring is seen with mild areas of biapical parenchymal calcification. Other: None. IMPRESSION: 1. Multilevel degenerative disc disease and facet hypertrophy, without central canal or foraminal narrowing. 2. No acute osseous abnormality. Electronically Signed   By: Virgina Norfolk M.D.   On: 06/15/2019 17:51   CT ABDOMEN PELVIS W CONTRAST  Result Date: 06/19/2019 CLINICAL DATA:  Abdominal pain. EXAM: CT ABDOMEN AND PELVIS WITH CONTRAST TECHNIQUE: Multidetector CT imaging of the abdomen and pelvis was performed using the standard protocol following bolus administration of intravenous contrast. CONTRAST:  117mL OMNIPAQUE IOHEXOL 300 MG/ML  SOLN COMPARISON:  April 14, 2019 FINDINGS: Lower chest: Multiple sternal wires are noted. Very mild atelectasis is seen along the posterior aspect of the right lung base. There is a small right pleural effusion. Hepatobiliary: Innumerable  heterogeneous low-attenuation liver lesions are seen. Status post cholecystectomy. A common bile duct stent is in place. Pancreas: There is mild dilatation of the pancreatic duct (6 mm). The remainder of the pancreas is unremarkable. Spleen: The spleen is small in size and slightly heterogeneous in appearance. Adrenals/Urinary Tract: Adrenal glands are unremarkable. Kidneys are normal, without renal calculi or hydronephrosis. A stable 1.2 cm simple cyst is seen within the mid right kidney. A 1.0 cm area of low attenuation is seen within the lower pole of the left kidney. This represents a new finding compared to the prior exam. Bladder is unremarkable. Stomach/Bowel: Stomach is within normal limits. The appendix is not identified. No evidence of bowel wall thickening, distention, or inflammatory changes. A very large amount of stool is seen within the distal sigmoid colon and rectum. Vascular/Lymphatic: Marked severity aortic atherosclerosis. No enlarged abdominal or pelvic lymph nodes. Reproductive: Status post hysterectomy. No adnexal masses. Other: No abdominal wall hernia or abnormality. No abdominopelvic ascites. Musculoskeletal: Multilevel degenerative changes seen throughout the lumbar spine. IMPRESSION: 1. Very large amount of stool in the distal sigmoid colon and rectum, consistent with fecal impaction. 2. Innumerable heterogeneous low-attenuation lesions in the liver. This represents a new finding when compared to the prior study dated April 14, 2019 and is concerning for metastatic disease. 3. Small right pleural effusion. 4. Evidence of prior cholecystectomy. 5. Common bile duct stent. 6. New 1.0 cm area of low attenuation within the lower pole of the left kidney. While this may represent a small renal cyst, correlation with renal ultrasound is recommended to exclude the presence of an associated soft tissue component. Aortic Atherosclerosis (ICD10-I70.0). Electronically Signed   By: Virgina Norfolk  M.D.   On: 06/22/2019 17:37   DG Chest Port 1 View  Result Date: 06/27/2019 CLINICAL DATA:  Altered mental status today. EXAM: PORTABLE CHEST 1 VIEW COMPARISON:  Single-view of the chest 04/14/2019. FINDINGS: Pacing device remains in place. The patient is status post CABG. Lungs are clear. Heart size is upper normal. No pneumothorax or pleural fluid. No acute or focal bony abnormality. IMPRESSION: No acute disease. Electronically Signed   By: Inge Rise M.D.   On:  07/11/2019 14:23   DG Foot Complete Right  Result Date: 07/07/2019 CLINICAL DATA:  Ecchymosis EXAM: RIGHT FOOT COMPLETE - 3+ VIEW COMPARISON:  None. FINDINGS: No acute displaced fracture or malalignment. Soft tissues are unremarkable. Clips adjacent to the distal tibia IMPRESSION: No acute osseous abnormality Electronically Signed   By: Donavan Foil M.D.   On: 06/20/2019 17:48    Microbiology Recent Results (from the past 240 hour(s))  Blood Culture (routine x 2)     Status: None (Preliminary result)   Collection Time: 07/11/2019  6:27 PM   Specimen: BLOOD  Result Value Ref Range Status   Specimen Description BLOOD BLOOD LEFT WRIST  Final   Special Requests   Final    BOTTLES DRAWN AEROBIC AND ANAEROBIC Blood Culture adequate volume   Culture   Final    NO GROWTH < 24 HOURS Performed at [facility], [address] [place], [place] [address]    Report Status PENDING  Incomplete  Blood Culture (routine x 2)     Status: None (Preliminary result)   Collection Time: 07/11/2019  7:01 PM   Specimen: BLOOD RIGHT HAND  Result Value Ref Range Status   Specimen Description BLOOD RIGHT HAND  Final   Special Requests   Final    AEROBIC BOTTLE ONLY Blood Culture results may not be optimal due to an inadequate volume of blood received in culture bottles   Culture   Final    NO GROWTH < 12 HOURS Performed at [facility], [address], [place], [place] [address]    Report Status PENDING  Incomplete  SARS Coronavirus 2 by RT PCR  (hospital order, performed in [place] hospital lab)     Status: None   Collection Time: 07/03/2019  8:33 PM  Result Value Ref Range Status   SARS Coronavirus 2 NEGATIVE NEGATIVE Final    Comment: (NOTE) SARS-CoV-2 target nucleic acids are NOT DETECTED. The SARS-CoV-2 RNA is generally detectable in upper and lower respiratory specimens during the acute phase of infection. The lowest concentration of SARS-CoV-2 viral copies this assay can detect is 250 copies / mL. A negative result does not preclude SARS-CoV-2 infection and should not be used as the sole basis for treatment or other patient management decisions.  A negative result may occur with improper specimen collection / handling, submission of specimen other than nasopharyngeal swab, presence of viral mutation(s) within the areas targeted by this assay, and inadequate number of viral copies (<250 copies / mL). A negative result must be combined with clinical observations, patient history, and epidemiological information. Fact Sheet for Patients:   StrictlyIdeas.no Fact Sheet for Healthcare Providers: BankingDealers.co.za This test is not yet approved or cleared  by the [place] FDA and has been authorized for detection and/or diagnosis of SARS-CoV-2 by FDA under an Emergency Use Authorization (EUA).  This EUA will remain in effect (meaning this test can be used) for the duration of the COVID-19 declaration under Section 564(b)(1) of the Act, 21 U.S.C. section 360bbb-3(b)(1), unless the authorization is terminated or revoked sooner. Performed at [facility], [address], [place], [place] [address]   MRSA PCR Screening     Status: None   Collection Time: 2019-07-26  2:46 AM   Specimen: Nasal Mucosa; Nasopharyngeal  Result Value Ref Range Status   MRSA by PCR NEGATIVE NEGATIVE Final    Comment:        The GeneXpert MRSA Assay (FDA approved for NASAL specimens only), is one  component  of a comprehensive MRSA colonization surveillance program. It is not intended to diagnose MRSA infection nor to guide or monitor treatment for MRSA infections. Performed at [facility], [address] [place], [place] [address]     Lab Basic Metabolic Panel: Recent Labs  Lab 07/07/2019 1457 07-31-2019 0357  NA 137 141  K 3.8 4.1  CL 99 104  CO2 25 19*  GLUCOSE 183* 161*  BUN 24* 30*  CREATININE 0.73 0.79  CALCIUM 9.0 9.0   Liver Function Tests: Recent Labs  Lab 06/16/2019 1457 2019/07/31 0357  AST 104* 214*  ALT 54* 71*  ALKPHOS 377* 341*  BILITOT 1.6* 1.5*  PROT 6.9 6.4*  ALBUMIN 3.7 3.2*   Recent Labs  Lab 06/16/2019 1457  LIPASE 91*   CBC: Recent Labs  Lab 06/26/2019 1457 07-31-19 0357  WBC 12.1* 24.1*  NEUTROABS 9.7*  --   HGB 9.4* 9.1*  HCT 30.2* 31.5*  MCV 87.8 92.9  PLT 54* 41*   Sepsis Labs: Recent Labs  Lab 06/16/2019 1457 06/22/2019 1827 07-31-2019 0357  WBC 12.1*  --  24.1*  LATICACIDVEN 4.7* 4.1* 7.2*    Procedures/Operations  Please see above for x-ray reports.    Barton Dubois 2019-07-31, 9:24 AM

## 2019-07-16 NOTE — Progress Notes (Signed)
Dr Olevia Bowens paged and made aware of patient moaning and rolling around in bed. Pt no alert, pale, and no longer oriented. Dr. Olevia Bowens stated he would be on the floor to look at patient

## 2019-07-16 NOTE — Care Plan (Signed)
[facility] notified of death. Patient is not a candidate per Retia Passe, reference # [id number]

## 2019-07-16 NOTE — Progress Notes (Signed)
Patient expired at 9:15 am. Nursing staff at bedside. daughter contacted. She was DNR/DNI.  Barton Dubois MD [phone]

## 2019-07-16 NOTE — Progress Notes (Signed)
Dr. Olevia Bowens spoke with patients daughter and updated her on condition of her mother. Patients daughter made her a DNR with which was confirmed by Dorothyann Peng, RN. Prn  Fentanyl given, pt remains in afib, but hr <100. Fentanyl effective on pain as well. Patient calm and appears more relaxed.

## 2019-07-16 NOTE — Progress Notes (Signed)
Patient c/o stomach pain/cramps. Prn maalox given. Updated Dr. Olevia Bowens on patient remaining in Afib with a rate >135. 5mg  metoprolol given along with 545ml bolus. See emar. Patient has had a total of 4 stools since being admitted to room 11. Stool continues to be liquid and black. Purewick placed as well.

## 2019-07-16 NOTE — Progress Notes (Addendum)
Patient arrived from ED via stretcher to ICU bed 11. Patient was accompained by RN and required assistance for transfer to bed.Patient oriented to self and situation.  Patient currently on Room Air with oxygen level >96%. Currently in Afib and on amiodarone gtt (see emar). Pulses palpable and weak, bruises noted to hands and feet, skin pale. Left hand contracted.  Patient was incontinent of black liquid stool x2. Patient cleaned, new pads applied. SCD's on and running. Bed alarm on, will continue to monitor.

## 2019-07-16 NOTE — Care Plan (Signed)
Daughter Clarise Cruz updated on pt's current condition

## 2019-07-16 DEATH — deceased

## 2019-07-27 ENCOUNTER — Other Ambulatory Visit (HOSPITAL_COMMUNITY): Payer: Medicare Other

## 2019-07-30 ENCOUNTER — Ambulatory Visit (HOSPITAL_COMMUNITY): Admit: 2019-07-30 | Payer: Medicare Other | Admitting: Gastroenterology

## 2019-07-30 SURGERY — ENDOSCOPIC RETROGRADE CHOLANGIOPANCREATOGRAPHY (ERCP) WITH PROPOFOL
Anesthesia: General

## 2019-08-07 ENCOUNTER — Ambulatory Visit: Payer: Medicare Other | Admitting: Neurology

## 2019-12-15 ENCOUNTER — Other Ambulatory Visit: Payer: Self-pay | Admitting: Nurse Practitioner

## 2020-01-30 IMAGING — DX DG ABDOMEN 1V
1 series · 1 of 1 positions shown · non-contrast
Comparison: 04/14/2019

CLINICAL DATA: NG tube placement

EXAM:
ABDOMEN - 1 VIEW

[abdomen kub]
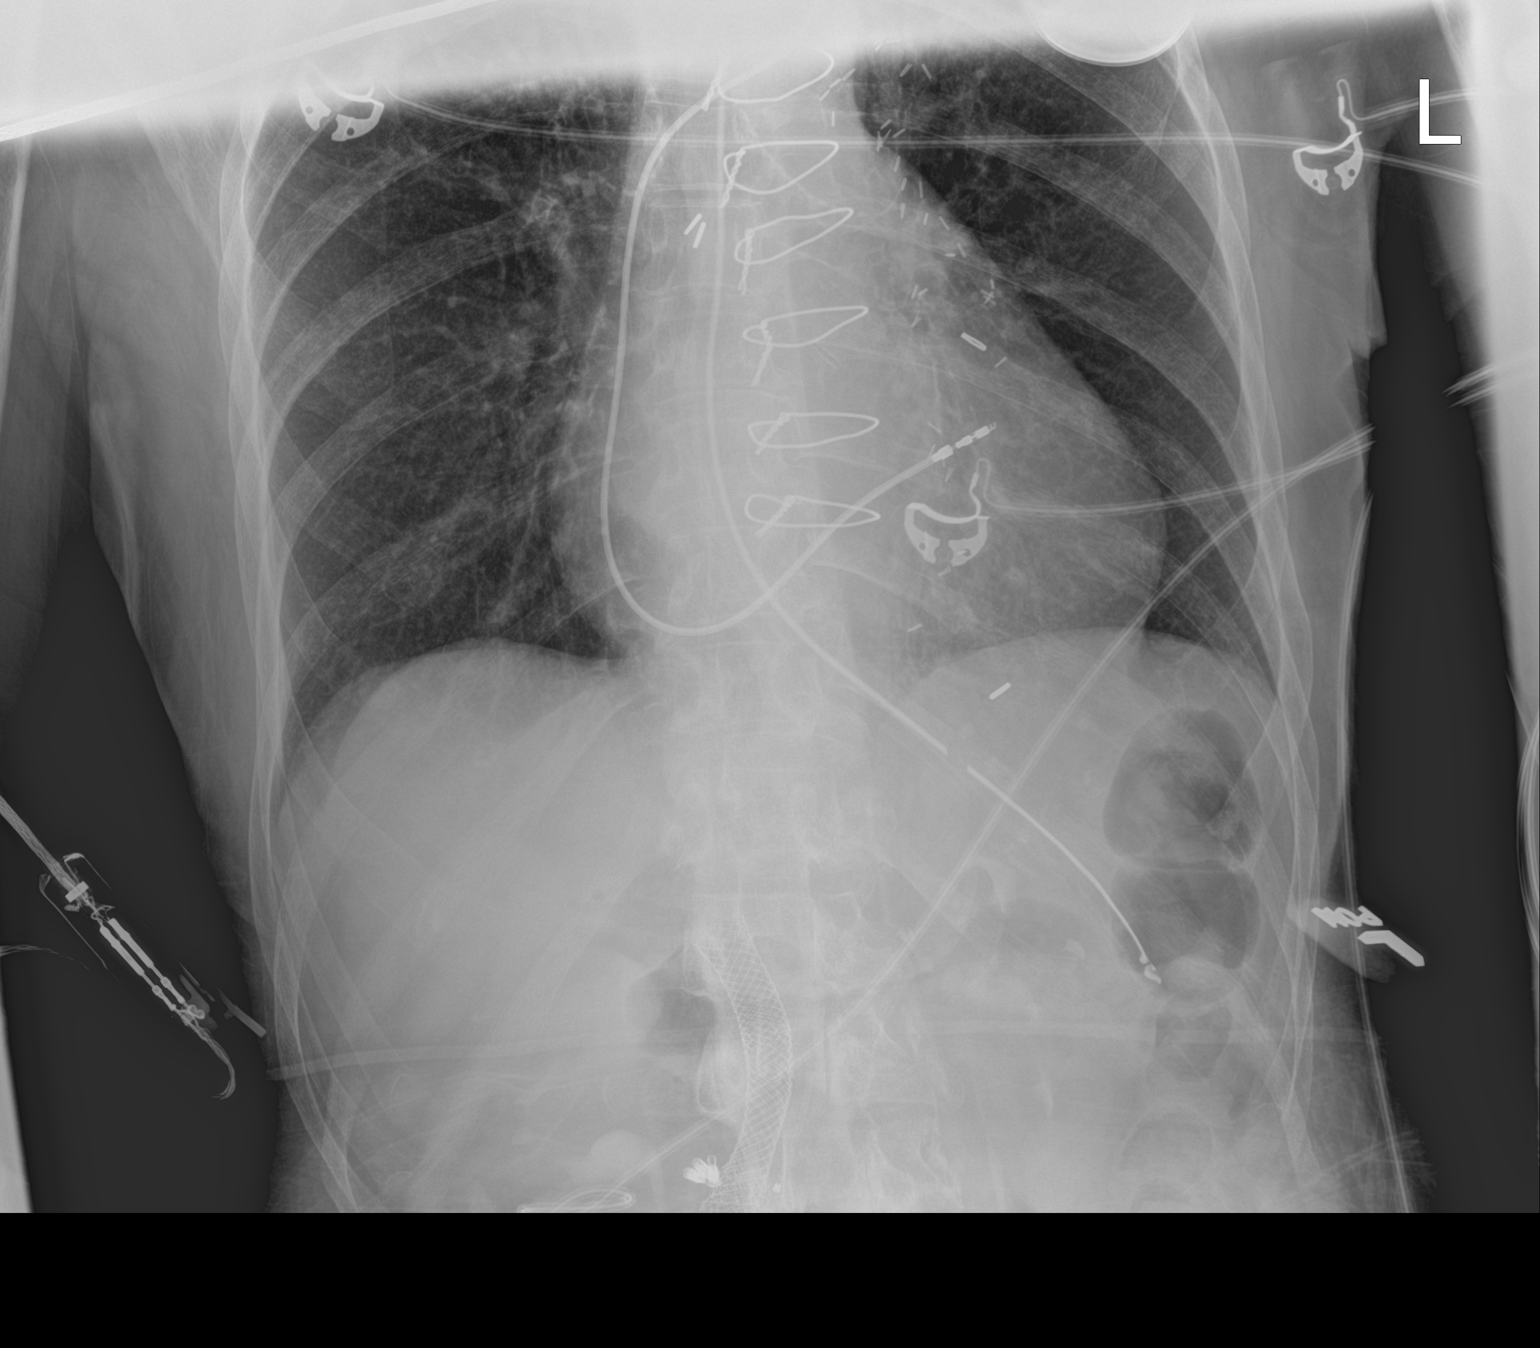

[1 of 1 positions shown; findings below may reference images not displayed]

FINDINGS: The enteric tube projects over the gastric body. The bowel gas
pattern is nonspecific and nonobstructive. A biliary stent is noted.
IMPRESSION: Enteric tube projects over the gastric body.

## 2020-02-15 ENCOUNTER — Encounter: Payer: Medicare Other | Admitting: Internal Medicine

## 2020-04-20 IMAGING — CT CT HEAD W/O CM
4 of 5 series · 15 of 47 positions shown, 17 images · non-contrast
Comparison: June 25, 2019

CLINICAL DATA: Status post trauma.

EXAM:
CT HEAD WITHOUT CONTRAST
TECHNIQUE: Contiguous axial images were obtained from the base of the skull
through the vertex without intravenous contrast.

[Series 2: head w o · axial · 0.40mm/px · z∈[+1669,+1759]mm · 4 of 32 slices shown]
[im 7/32  brain]
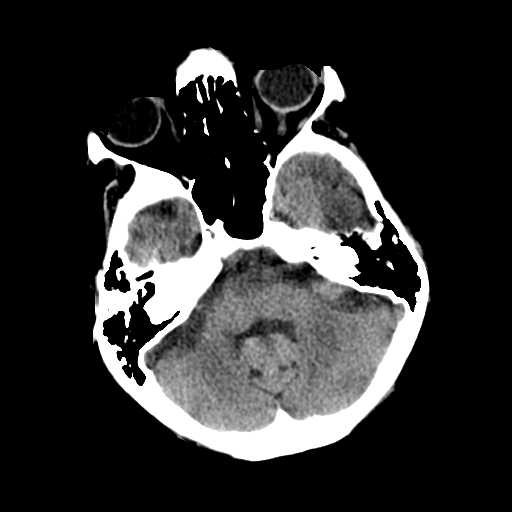
[im 13/32  brain]
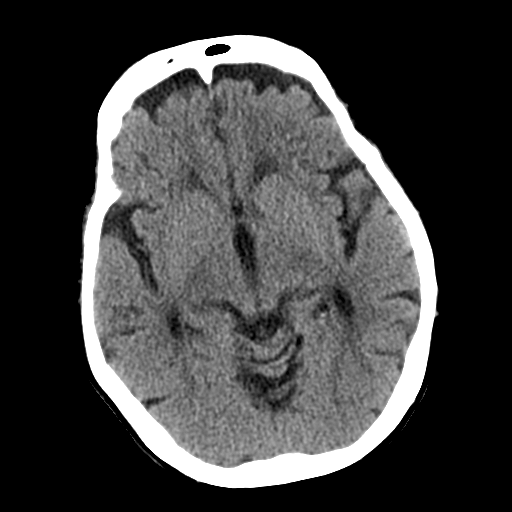
[im 19/32  brain]
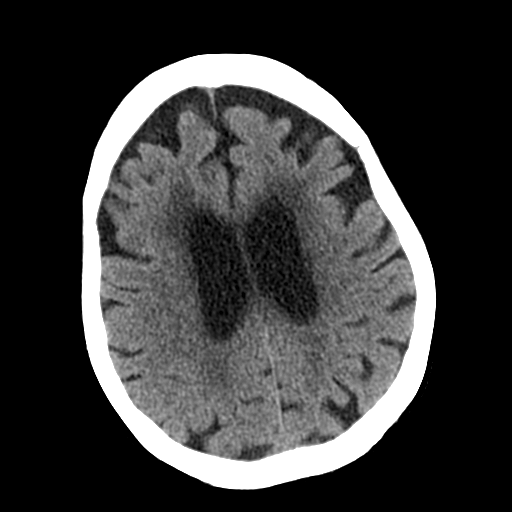
[im 25/32  brain]
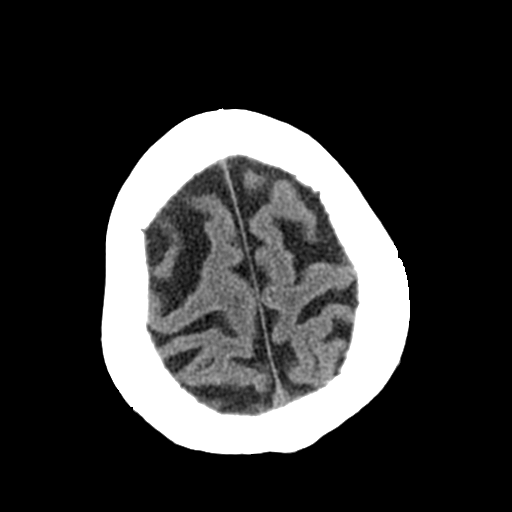

[Series 4: head ax w o · axial · 0.33mm/px · z∈[+1659,+1759]mm · 5 of 32 slices shown, 7 images]
[im 6/32  brain]
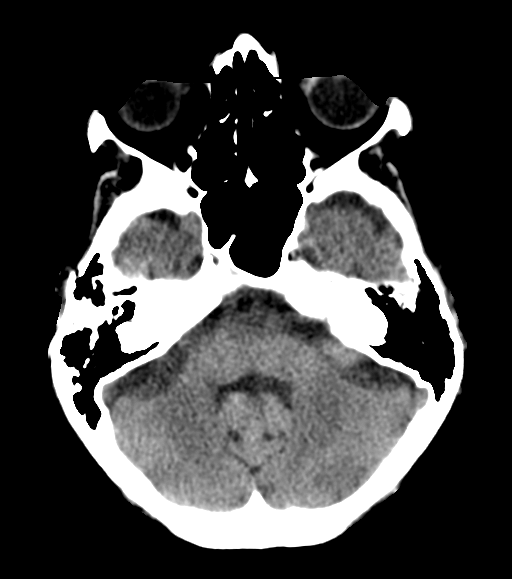
[im 6/32  bone]
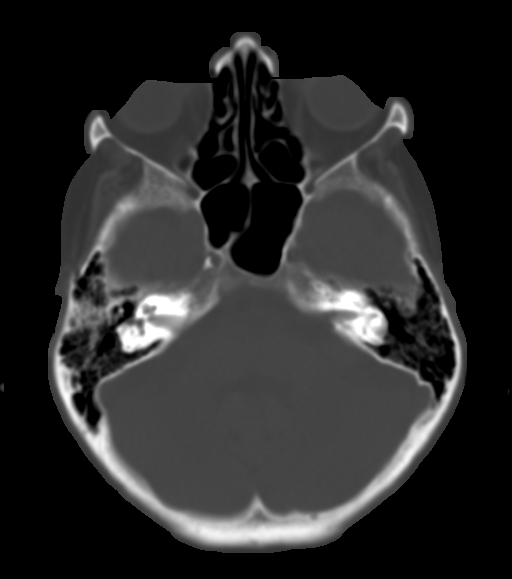
[im 11/32  brain]
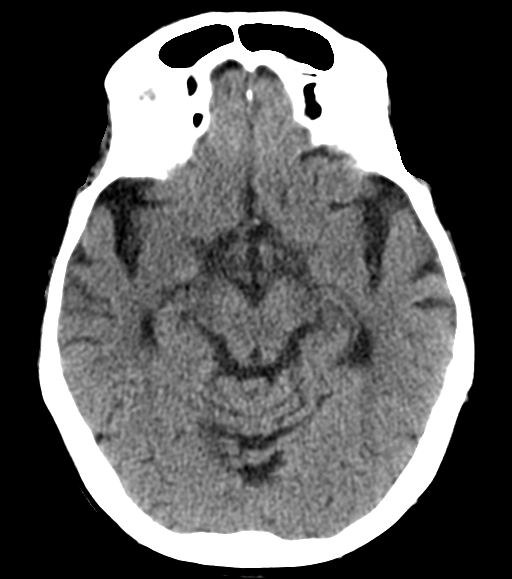
[im 16/32  brain]
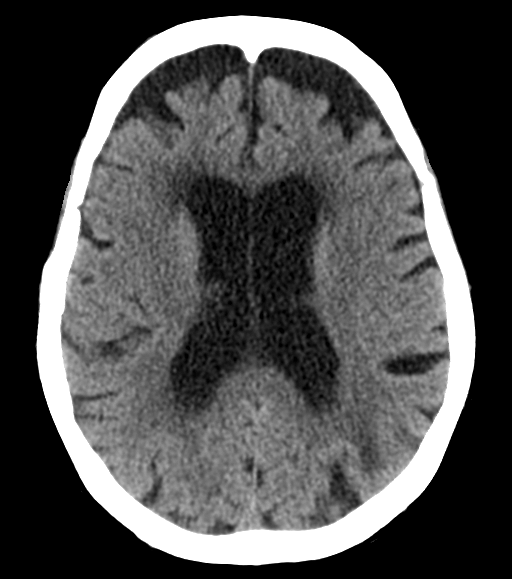
[im 21/32  brain]
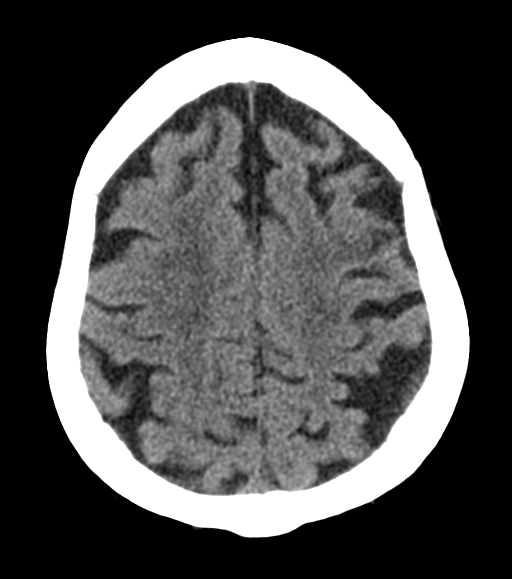
[im 26/32  brain]
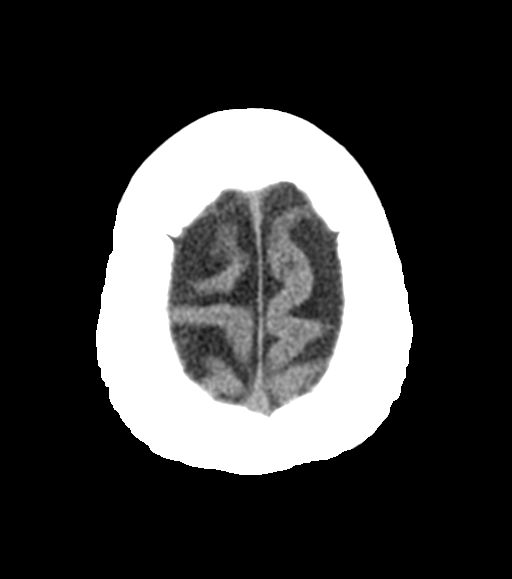
[im 26/32  bone]
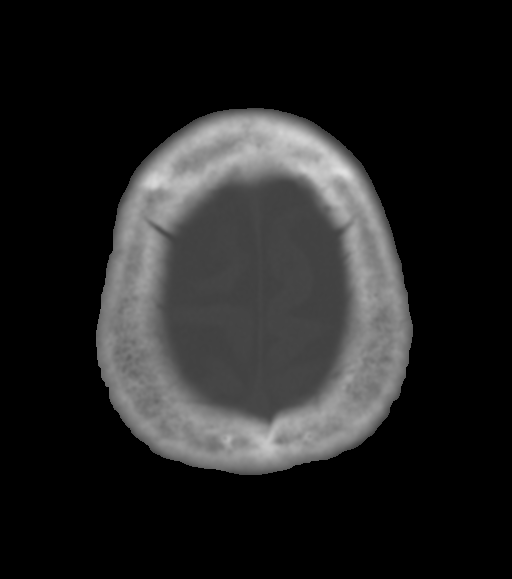

[Series 5: coronal soft · coronal · 0.31mm/px · 3 of 71 slices shown]
[im 24/71  brain]
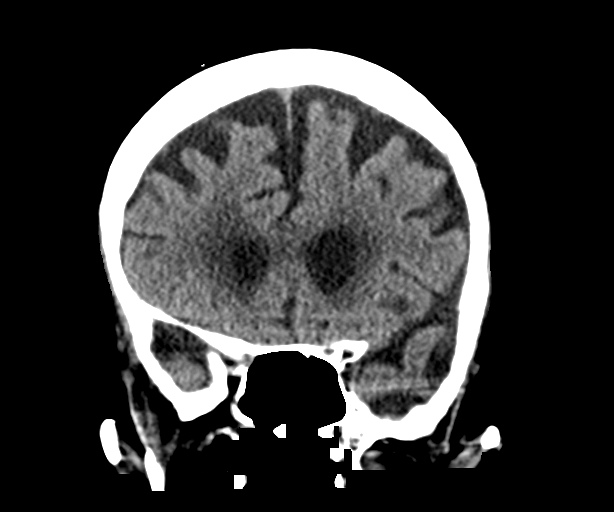
[im 32/71  brain]
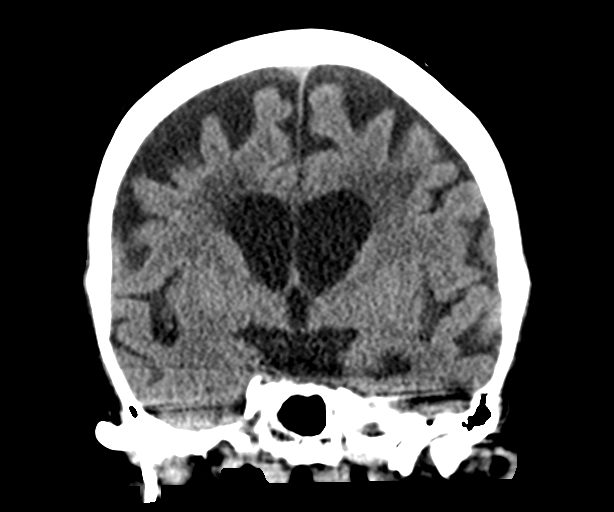
[im 39/71  brain]
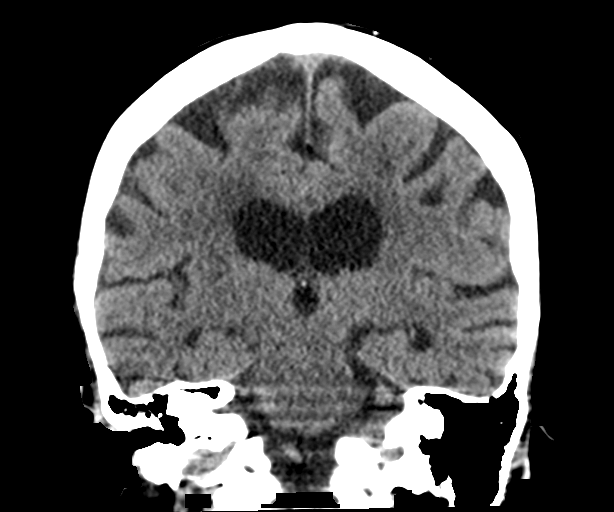

[Series 6: sagittal soft · sagittal · 0.31mm/px · 3 of 58 slices shown]
[im 20/58  brain]
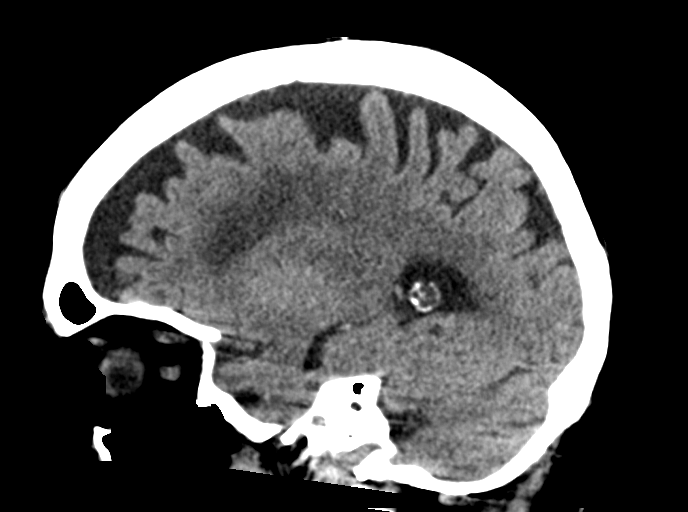
[im 29/58  brain]
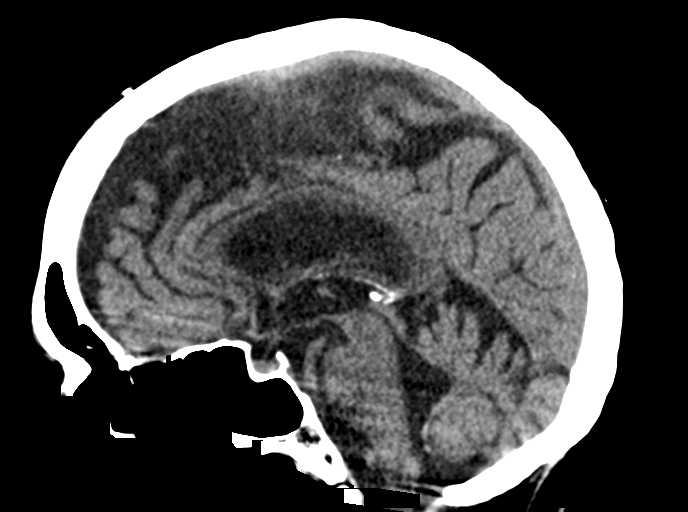
[im 39/58  brain]
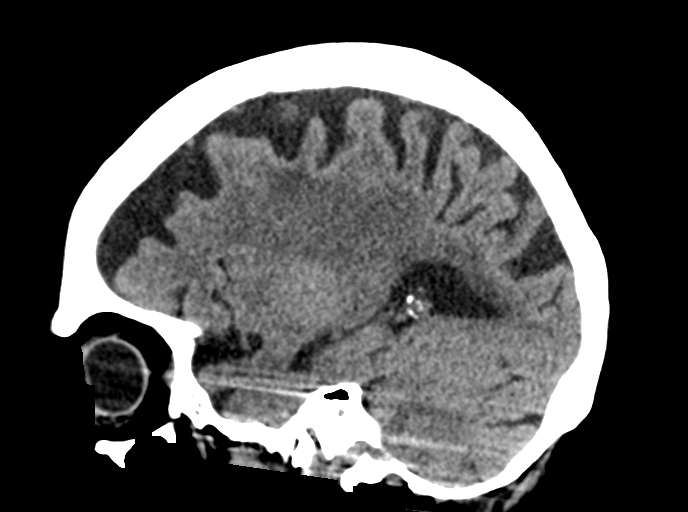

[15 of 47 positions shown; findings below may reference images not displayed]

FINDINGS: Brain: There is moderate severity cerebral atrophy with widening of
the extra-axial spaces and ventricular dilatation.
There are areas of decreased attenuation within the white matter
tracts of the supratentorial brain, consistent with microvascular
disease changes.

A small area of cortical encephalomalacia, with adjacent chronic
white matter low attenuation is seen within the left occipital lobe.

Vascular: No hyperdense vessel or unexpected calcification.

Skull: Normal. Negative for fracture or focal lesion.

Sinuses/Orbits: No acute finding.

Other: None.
IMPRESSION: 1. Generalized cerebral atrophy.
2. Small chronic left occipital lobe infarct.
3. No acute intracranial abnormality.
